# Patient Record
Sex: Female | Born: 1990
Health system: Southern US, Community
[De-identification: ages and names within clinical notes are randomized; demographics above are authoritative.]

## PROBLEM LIST (undated history)

## (undated) DIAGNOSIS — Z789 Other specified health status: Secondary | ICD-10-CM

## (undated) DIAGNOSIS — B009 Herpesviral infection, unspecified: Secondary | ICD-10-CM

## (undated) DIAGNOSIS — O139 Gestational [pregnancy-induced] hypertension without significant proteinuria, unspecified trimester: Secondary | ICD-10-CM

## (undated) HISTORY — DX: Other specified health status: Z78.9

## (undated) HISTORY — PX: NO PAST SURGERIES: SHX2092

---

## 2013-06-07 ENCOUNTER — Ambulatory Visit: Payer: Self-pay

## 2013-07-10 ENCOUNTER — Ambulatory Visit: Payer: Self-pay | Attending: Internal Medicine | Admitting: Internal Medicine

## 2013-07-10 ENCOUNTER — Encounter: Payer: Self-pay | Admitting: Internal Medicine

## 2013-07-10 ENCOUNTER — Encounter (INDEPENDENT_AMBULATORY_CARE_PROVIDER_SITE_OTHER): Payer: Self-pay

## 2013-07-10 VITALS — BP 135/80 | HR 88 | Temp 98.4°F | Resp 17 | Wt 171.8 lb

## 2013-07-10 DIAGNOSIS — Z23 Encounter for immunization: Secondary | ICD-10-CM

## 2013-07-10 DIAGNOSIS — R030 Elevated blood-pressure reading, without diagnosis of hypertension: Secondary | ICD-10-CM | POA: Insufficient documentation

## 2013-07-10 DIAGNOSIS — Z139 Encounter for screening, unspecified: Secondary | ICD-10-CM

## 2013-07-10 DIAGNOSIS — IMO0001 Reserved for inherently not codable concepts without codable children: Secondary | ICD-10-CM

## 2013-07-10 DIAGNOSIS — M791 Myalgia, unspecified site: Secondary | ICD-10-CM

## 2013-07-10 DIAGNOSIS — N644 Mastodynia: Secondary | ICD-10-CM

## 2013-07-10 LAB — CBC WITH DIFFERENTIAL/PLATELET
Basophils Absolute: 0 10*3/uL (ref 0.0–0.1)
Basophils Relative: 0 % (ref 0–1)
EOS ABS: 0 10*3/uL (ref 0.0–0.7)
EOS PCT: 1 % (ref 0–5)
HCT: 39.6 % (ref 36.0–46.0)
HEMOGLOBIN: 13.6 g/dL (ref 12.0–15.0)
LYMPHS PCT: 40 % (ref 12–46)
Lymphs Abs: 3.4 10*3/uL (ref 0.7–4.0)
MCH: 29.8 pg (ref 26.0–34.0)
MCHC: 34.3 g/dL (ref 30.0–36.0)
MCV: 86.7 fL (ref 78.0–100.0)
MONOS PCT: 6 % (ref 3–12)
Monocytes Absolute: 0.5 10*3/uL (ref 0.1–1.0)
Neutro Abs: 4.6 10*3/uL (ref 1.7–7.7)
Neutrophils Relative %: 53 % (ref 43–77)
PLATELETS: 243 10*3/uL (ref 150–400)
RBC: 4.57 MIL/uL (ref 3.87–5.11)
RDW: 13.2 % (ref 11.5–15.5)
WBC: 8.6 10*3/uL (ref 4.0–10.5)

## 2013-07-10 NOTE — Patient Instructions (Signed)
2 Gram Low Sodium Diet A 2 gram sodium diet restricts the amount of sodium in the diet to no more than 2 g or 2000 mg daily. Limiting the amount of sodium is often used to help lower blood pressure. It is important if you have heart, liver, or kidney problems. Many foods contain sodium for flavor and sometimes as a preservative. When the amount of sodium in a diet needs to be low, it is important to know what to look for when choosing foods and drinks. The following includes some information and guidelines to help make it easier for you to adapt to a low sodium diet. QUICK TIPS  Do not add salt to food.  Avoid convenience items and fast food.  Choose unsalted snack foods.  Buy lower sodium products, often labeled as "lower sodium" or "no salt added."  Check food labels to learn how much sodium is in 1 serving.  When eating at a restaurant, ask that your food be prepared with less salt or none, if possible. READING FOOD LABELS FOR SODIUM INFORMATION The nutrition facts label is a good place to find how much sodium is in foods. Look for products with no more than 500 to 600 mg of sodium per meal and no more than 150 mg per serving. Remember that 2 g = 2000 mg. The food label may also list foods as:  Sodium-free: Less than 5 mg in a serving.  Very low sodium: 35 mg or less in a serving.  Low-sodium: 140 mg or less in a serving.  Light in sodium: 50% less sodium in a serving. For example, if a food that usually has 300 mg of sodium is changed to become light in sodium, it will have 150 mg of sodium.  Reduced sodium: 25% less sodium in a serving. For example, if a food that usually has 400 mg of sodium is changed to reduced sodium, it will have 300 mg of sodium. CHOOSING FOODS Grains  Avoid: Salted crackers and snack items. Some cereals, including instant hot cereals. Bread stuffing and biscuit mixes. Seasoned rice or pasta mixes.  Choose: Unsalted snack items. Low-sodium cereals, oats,  puffed wheat and rice, shredded wheat. English muffins and bread. Pasta. Meats  Avoid: Salted, canned, smoked, spiced, pickled meats, including fish and poultry. Bacon, ham, sausage, cold cuts, hot dogs, anchovies.  Choose: Low-sodium canned tuna and salmon. Fresh or frozen meat, poultry, and fish. Dairy  Avoid: Processed cheese and spreads. Cottage cheese. Buttermilk and condensed milk. Regular cheese.  Choose: Milk. Low-sodium cottage cheese. Yogurt. Sour cream. Low-sodium cheese. Fruits and Vegetables  Avoid: Regular canned vegetables. Regular canned tomato sauce and paste. Frozen vegetables in sauces. Olives. Pickles. Relishes. Sauerkraut.  Choose: Low-sodium canned vegetables. Low-sodium tomato sauce and paste. Frozen or fresh vegetables. Fresh and frozen fruit. Condiments  Avoid: Canned and packaged gravies. Worcestershire sauce. Tartar sauce. Barbecue sauce. Soy sauce. Steak sauce. Ketchup. Onion, garlic, and table salt. Meat flavorings and tenderizers.  Choose: Fresh and dried herbs and spices. Low-sodium varieties of mustard and ketchup. Lemon juice. Tabasco sauce. Horseradish. SAMPLE 2 GRAM SODIUM MEAL PLAN Breakfast / Sodium (mg)  1 cup low-fat milk / 143 mg  2 slices whole-wheat toast / 270 mg  1 tbs heart-healthy margarine / 153 mg  1 hard-boiled egg / 139 mg  1 small orange / 0 mg Lunch / Sodium (mg)  1 cup raw carrots / 76 mg   cup hummus / 298 mg  1 cup low-fat milk /   143 mg   cup red grapes / 2 mg  1 whole-wheat pita bread / 356 mg Dinner / Sodium (mg)  1 cup whole-wheat pasta / 2 mg  1 cup low-sodium tomato sauce / 73 mg  3 oz lean ground beef / 57 mg  1 small side salad (1 cup raw spinach leaves,  cup cucumber,  cup yellow bell pepper) with 1 tsp olive oil and 1 tsp red wine vinegar / 25 mg Snack / Sodium (mg)  1 container low-fat vanilla yogurt / 107 mg  3 graham cracker squares / 127 mg Nutrient Analysis  Calories: 2033  Protein:  77 g  Carbohydrate: 282 g  Fat: 72 g  Sodium: 1971 mg Document Released: 06/01/2005 Document Revised: 08/24/2011 Document Reviewed: 09/02/2009 Exit[employer] Patient Information 2014 [employer].  

## 2013-07-10 NOTE — Progress Notes (Signed)
Patient here to establish care Has intermittent breast pain Leg pain to her upper thighs-usually when it is cold out

## 2013-07-10 NOTE — Progress Notes (Signed)
Patient Demographics  Rhonda Avila, is a 23 y.o. female  ZOX:[id number]  [id number]  DOB - [birth year]  CC:  Chief Complaint  Patient presents with  . Establish Care       HPI: Rhonda Avila is a 23 y.o. female here today to establish medical care. Patient reported to have breast pain especially around the menstrual cycle denies any discharge fever chills, she also reported to have feeling of some muscle pain in her thighs especially when it is cold season denies any pain currently. Patient has No headache, No chest pain, No abdominal pain - No Nausea, No new weakness tingling or numbness, No Cough - SOB.  No Known Allergies History reviewed. No pertinent past medical history. No current outpatient prescriptions on file prior to visit.   No current facility-administered medications on file prior to visit.   Family History  Problem Relation Age of Onset  . Hypertension Maternal Grandmother    History   Social History  . Marital Status: Single    Spouse Name: N/A    Number of Children: N/A  . Years of Education: N/A   Occupational History  . Not on file.   Social History Main Topics  . Smoking status: Never Smoker   . Smokeless tobacco: Not on file  . Alcohol Use: No  . Drug Use: Not on file  . Sexual Activity: Not on file   Other Topics Concern  . Not on file   Social History Narrative  . No narrative on file    Review of Systems: Constitutional: Negative for fever, chills, diaphoresis, activity change, appetite change and fatigue. HENT: Negative for ear pain, nosebleeds, congestion, facial swelling, rhinorrhea, neck pain, neck stiffness and ear discharge.  Eyes: Negative for pain, discharge, redness, itching and visual disturbance. Respiratory: Negative for cough, choking, chest tightness, shortness of breath, wheezing and stridor.  Cardiovascular: Negative for chest pain, palpitations and leg swelling. Gastrointestinal: Negative for abdominal  distention. Genitourinary: Negative for dysuria, urgency, frequency, hematuria, flank pain, decreased urine volume, difficulty urinating and dyspareunia.  Musculoskeletal: Negative for back pain, joint swelling, arthralgia and gait problem. Neurological: Negative for dizziness, tremors, seizures, syncope, facial asymmetry, speech difficulty, weakness, light-headedness, numbness and headaches.  Hematological: Negative for adenopathy. Does not bruise/bleed easily. Psychiatric/Behavioral: Negative for hallucinations, behavioral problems, confusion, dysphoric mood, decreased concentration and agitation.    Objective:   Filed Vitals:   07/10/13 1651  BP: 135/80  Pulse:   Temp:   Resp:     Physical Exam: Constitutional: Patient appears well-developed and well-nourished. No distress. HENT: Normocephalic, atraumatic, External right and left ear normal. Oropharynx is clear and moist.  Eyes: Conjunctivae and EOM are normal. PERRLA, no scleral icterus. Neck: Normal ROM. Neck supple. No JVD. No tracheal deviation. No thyromegaly. CVS: RRR, S1/S2 +, no murmurs, no gallops, no carotid bruit.  Pulmonary: Effort and breath sounds normal, no stridor, rhonchi, wheezes, rales.  Abdominal: Soft. BS +, no distension, tenderness, rebound or guarding.  Musculoskeletal: Normal range of motion. No edema and no tenderness.  Neuro: Alert. Normal reflexes, muscle tone coordination. No cranial nerve deficit. Skin: Skin is warm and dry. No rash noted. Not diaphoretic. No erythema. No pallor. Psychiatric: Normal mood and affect. Behavior, judgment, thought content normal.  No results found for this basename: WBC,  HGB,  HCT,  MCV,  PLT   No results found for this basename: CREATININE,  BUN,  NA,  K,  CL,  CO2    No results found for this  basename: HGBA1C   Lipid Panel  No results found for this basename: chol,  trig,  hdl,  cholhdl,  vldl,  ldlcalc       Assessment and plan:   1. Elevated BP Patient  has family history of hypertension I have advise for low salt diet.  2. Muscle pain We'll obtain baseline blood work.  3. Breast pain  - Ambulatory referral to Gynecology  4. Screening  - Ambulatory referral to Gynecology - CBC with Differential - COMPLETE METABOLIC PANEL WITH GFR - TSH - Lipid panel - Vit D  25 hydroxy (rtn osteoporosis monitoring)    6. Need for prophylactic vaccination and inoculation against influenza  Flu shot given today  Health Maintenance  -Pap Smear: referred to GYN  Return in about 6 weeks (around 08/21/2013).     Doris Cheadle, MD

## 2013-07-11 LAB — LIPID PANEL
Cholesterol: 174 mg/dL (ref 0–200)
HDL: 83 mg/dL (ref >39)
LDL CALC: 72 mg/dL (ref 0–99)
Total CHOL/HDL Ratio: 2.1 Ratio
Triglycerides: 95 mg/dL (ref <150)
VLDL: 19 mg/dL (ref 0–40)

## 2013-07-11 LAB — COMPLETE METABOLIC PANEL WITH GFR
ALT: 61 U/L — AB (ref 0–35)
AST: 25 U/L (ref 0–37)
Albumin: 4.2 g/dL (ref 3.5–5.2)
Alkaline Phosphatase: 75 U/L (ref 39–117)
BILIRUBIN TOTAL: 0.4 mg/dL (ref 0.3–1.2)
BUN: 9 mg/dL (ref 6–23)
CALCIUM: 9.7 mg/dL (ref 8.4–10.5)
CHLORIDE: 103 meq/L (ref 96–112)
CO2: 25 mEq/L (ref 19–32)
Creat: 0.79 mg/dL (ref 0.50–1.10)
GFR, Est African American: >89 mL/min
Glucose, Bld: 86 mg/dL (ref 70–99)
Potassium: 4 mEq/L (ref 3.5–5.3)
SODIUM: 138 meq/L (ref 135–145)
TOTAL PROTEIN: 7.5 g/dL (ref 6.0–8.3)

## 2013-07-11 LAB — VITAMIN D 25 HYDROXY (VIT D DEFICIENCY, FRACTURES): VIT D 25 HYDROXY: 16 ng/mL — AB (ref 30–89)

## 2013-07-11 LAB — TSH: TSH: 1.648 u[IU]/mL (ref 0.350–4.500)

## 2013-07-12 ENCOUNTER — Telehealth: Payer: Self-pay

## 2013-07-12 NOTE — Telephone Encounter (Signed)
Patient not available  Left message on machine to return our call 

## 2013-07-12 NOTE — Telephone Encounter (Signed)
Message copied by Lestine MountJUAREZ, Mateus Rewerts L on Wed Jul 12, 2013  2:19 PM ------      Message from: Doris CheadleADVANI, DEEPAK      Created: Tue Jul 11, 2013 11:00 AM       Blood work reviewed, noticed low vitamin D, call patient advise to start ergocalciferol 50,000 units once a week for the duration of  12 weeks.       ------

## 2013-07-13 ENCOUNTER — Telehealth: Payer: Self-pay | Admitting: Internal Medicine

## 2013-07-13 NOTE — Telephone Encounter (Signed)
Patient called in regards to missed call yesterday.Please call patient.

## 2013-07-13 NOTE — Telephone Encounter (Signed)
Pt returning call, please f/u  °

## 2013-07-17 MED ORDER — VITAMIN D (ERGOCALCIFEROL) 1.25 MG (50000 UNIT) PO CAPS: 50000.0000 [IU] | ORAL_CAPSULE | ORAL | Status: DC

## 2013-07-17 NOTE — Telephone Encounter (Signed)
Patient is aware of her lab results Prescription sent to [facility] in [place]

## 2013-08-21 ENCOUNTER — Encounter: Payer: Self-pay | Admitting: Obstetrics & Gynecology

## 2013-08-22 ENCOUNTER — Ambulatory Visit: Payer: Self-pay | Admitting: Internal Medicine

## 2016-07-23 ENCOUNTER — Ambulatory Visit: Payer: Self-pay | Attending: Family Medicine | Admitting: Family Medicine

## 2016-07-23 VITALS — BP 117/71 | HR 84 | Temp 98.1°F | Resp 18 | Ht 62.0 in | Wt 213.2 lb

## 2016-07-23 DIAGNOSIS — Z79899 Other long term (current) drug therapy: Secondary | ICD-10-CM | POA: Insufficient documentation

## 2016-07-23 DIAGNOSIS — Z23 Encounter for immunization: Secondary | ICD-10-CM | POA: Insufficient documentation

## 2016-07-23 DIAGNOSIS — B009 Herpesviral infection, unspecified: Secondary | ICD-10-CM | POA: Insufficient documentation

## 2016-07-23 DIAGNOSIS — R300 Dysuria: Secondary | ICD-10-CM | POA: Insufficient documentation

## 2016-07-23 DIAGNOSIS — R21 Rash and other nonspecific skin eruption: Secondary | ICD-10-CM | POA: Insufficient documentation

## 2016-07-23 DIAGNOSIS — N898 Other specified noninflammatory disorders of vagina: Secondary | ICD-10-CM | POA: Insufficient documentation

## 2016-07-23 DIAGNOSIS — R635 Abnormal weight gain: Secondary | ICD-10-CM

## 2016-07-23 DIAGNOSIS — R102 Pelvic and perineal pain: Secondary | ICD-10-CM | POA: Insufficient documentation

## 2016-07-23 DIAGNOSIS — Z113 Encounter for screening for infections with a predominantly sexual mode of transmission: Secondary | ICD-10-CM | POA: Insufficient documentation

## 2016-07-23 LAB — POCT URINALYSIS DIPSTICK
BILIRUBIN UA: NEGATIVE
GLUCOSE UA: NEGATIVE
Ketones, UA: NEGATIVE
LEUKOCYTES UA: NEGATIVE
NITRITE UA: NEGATIVE
Protein, UA: NEGATIVE
Spec Grav, UA: 1.015
UROBILINOGEN UA: 1
pH, UA: 7

## 2016-07-23 LAB — POCT URINE PREGNANCY: PREG TEST UR: NEGATIVE

## 2016-07-23 LAB — TSH: TSH: 0.92 mIU/L

## 2016-07-23 MED ORDER — METRONIDAZOLE 500 MG PO TABS: 500.0000 mg | ORAL_TABLET | Freq: Two times a day (BID) | ORAL | 0 refills | Status: DC

## 2016-07-23 MED ORDER — NYSTATIN 100000 UNIT/GM EX CREA: 1.0000 "application " | TOPICAL_CREAM | Freq: Two times a day (BID) | CUTANEOUS | 0 refills | Status: DC

## 2016-07-23 MED ORDER — FLUCONAZOLE 150 MG PO TABS: 150.0000 mg | ORAL_TABLET | Freq: Once | ORAL | 0 refills | Status: AC

## 2016-07-23 MED FILL — FLUCONAZOLE 150 MG TABLET: 150 | 1 days supply | Qty: 1 | Fill #0

## 2016-07-23 MED FILL — NYSTATIN 100,000 UNIT/GM CR: 100000 | 20 days supply | Qty: 30 | Fill #0

## 2016-07-23 MED FILL — ?METRONIDAZOLE 500 MG TABLE: 500 | 7 days supply | Qty: 14 | Fill #0

## 2016-07-23 NOTE — Progress Notes (Signed)
Subjective:  Patient ID: Rhonda Avila, female    DOB: [birth year]  Age: 26 y.o. MRN: [id number]  CC: Establish Care   HPI Rhonda Avila presents for complaints of aginal discharge for 3 weeks. Reports discharge is brown, and, and malodorous. Reports douching with no relief of symptoms. She reports pelvic pain with dysuria 4 days ago. She also reports vaginal rash 8 days ago. She reports one sexual partner within the last 3 months. Reports last time she was sexually active was 2 days ago and was unprotected. She would like screening for STDs and pregnancy. She also c/o history of weight gain within the last 3 years of almost 50 lbs.   Outpatient Medications Prior to Visit  Medication Sig Dispense Refill  . Vitamin D, Ergocalciferol, (DRISDOL) 50000 UNITS CAPS capsule Take 1 capsule (50,000 Units total) by mouth every 7 (seven) days. (Patient not taking: Reported on 07/23/2016) 12 capsule 0   No facility-administered medications prior to visit.     ROS Review of Systems  Respiratory: Negative.   Cardiovascular: Negative.   Gastrointestinal: Negative.   Genitourinary: Positive for dysuria, pelvic pain and vaginal discharge.    Objective:  BP 117/71 (BP Location: Left Arm, Patient Position: Sitting, Cuff Size: Normal)   Pulse 84   Temp 98.1 F (36.7 C) (Oral)   Resp 18   Ht 5\' 2"  (1.575 m)   Wt 213 lb 3.2 oz (96.7 kg)   SpO2 98%   BMI 38.99 kg/m   BP/Weight 07/23/2016 07/10/2013  Systolic BP 117 135  Diastolic BP 71 80  Wt. (Lbs) 213.2 171.8  BMI 38.99 -     Physical Exam  Cardiovascular: Normal rate, regular rhythm, normal heart sounds and intact distal pulses.   Pulmonary/Chest: Effort normal and breath sounds normal.  Abdominal: Soft. Bowel sounds are normal. There is tenderness (pelvic).  Genitourinary:  Genitourinary Comments: Urine cytology screen.  Nursing note and vitals reviewed.   Assessment & Plan:   Problem List Items Addressed This Visit    None      Visit Diagnoses    Vaginal discharge    -  Primary   Relevant Medications   metroNIDAZOLE (FLAGYL) 500 MG tablet   Other Relevant Orders   Urine cytology ancillary only (Completed)   POCT urine pregnancy (Completed)   Screening examination for STD (sexually transmitted disease)       Relevant Orders   Urine cytology ancillary only (Completed)   STD Panel (HBSAG,HIV,RPR) (Completed)   HSV(herpes simplex vrs) 1+2 ab-IgG (Completed)   Needs flu shot       Relevant Orders   Flu Vaccine QUAD 36+ mos PF IM (Fluarix & Fluzone Quad PF) (Completed)   Weight gain       Relevant Orders   POCT urine pregnancy (Completed)   TSH (Completed)   Dysuria       Relevant Orders   Urine cytology ancillary only (Completed)   Urinalysis Dipstick (Completed)   Skin rash       Relevant Medications   nystatin cream (MYCOSTATIN)      Meds ordered this encounter  Medications  . nystatin cream (MYCOSTATIN)    Sig: Apply 1 application topically 2 (two) times daily. Apply to affected areas.    Dispense:  30 g    Refill:  0    Order Specific Question:   Supervising Provider    Answer:   Quentin AngstJEGEDE, OLUGBEMIGA E [id number]]  . metroNIDAZOLE (FLAGYL) 500 MG tablet    Sig:  Take 1 tablet (500 mg total) by mouth 2 (two) times daily.    Dispense:  14 tablet    Refill:  0    Order Specific Question:   Supervising Provider    Answer:   Quentin Angst [id number]  . fluconazole (DIFLUCAN) 150 MG tablet    Sig: Take 1 tablet (150 mg total) by mouth once.    Dispense:  1 tablet    Refill:  0    Order Specific Question:   Supervising Provider    Answer:   Quentin Angst [[id number]]    Follow-up: Return in about 2 weeks (around 08/06/2016) for physical .   Lizbeth Bark FNP

## 2016-07-23 NOTE — Patient Instructions (Addendum)
Schedule an appointment for a physical in 2 weeks.  Vaginitis Vaginitis is an inflammation of the vagina. It can happen when the normal bacteria and yeast in the vagina grow too much. There are different types. Treatment will depend on the type you have. Follow these instructions at home:  Take all medicines as told by your doctor.  Keep your vagina area clean and dry. Avoid soap. Rinse the area with water.  Avoid washing and cleaning out the vagina (douching).  Do not use tampons or have sex (intercourse) until your treatment is done.  Wipe from front to back after going to the restroom.  Wear cotton underwear.  Avoid wearing underwear while you sleep until your vaginitis is gone.  Avoid tight pants. Avoid underwear or nylons without a cotton panel.  Take off wet clothing (such as a bathing suit) as soon as you can.  Use mild, unscented products. Avoid fabric softeners and scented:  Feminine sprays.  Laundry detergents.  Tampons.  Soaps or bubble baths.  Practice safe sex and use condoms. Get help right away if:  You have belly (abdominal) pain.  You have a fever or lasting symptoms for more than 2-3 days.  You have a fever and your symptoms suddenly get worse. This information is not intended to replace advice given to you by your health care provider. Make sure you discuss any questions you have with your health care provider. Document Released: 08/28/2008 Document Revised: 11/07/2015 Document Reviewed: 11/12/2011 Elsevier Interactive Patient Education  2017 [employer]   Sexually Transmitted Disease A sexually transmitted disease (STD) is a disease or infection often passed to another person during sex. However, STDs can be passed through nonsexual ways. An STD can be passed through:  Spit (saliva).  Semen.  Blood.  Mucus from the vagina.  Pee (urine). How can I lessen my chances of getting an STD?  Use:  Latex condoms.  Water-soluble lubricants  with condoms. Do not use petroleum jelly or oils.  Dental dams. These are small pieces of latex that are used as a barrier during oral sex.  Avoid having more than one sex partner.  Do not have sex with someone who has other sex partners.  Do not have sex with anyone you do not know or who is at high risk for an STD.  Avoid risky sex that can break your skin.  Do not have sex if you have open sores on your mouth or skin.  Avoid drinking too much alcohol or taking illegal drugs. Alcohol and drugs can affect your good judgment.  Avoid oral and anal sex acts.  Get shots (vaccines) for HPV and hepatitis.  If you are at risk of being infected with HIV, it is advised that you take a certain medicine daily to prevent HIV infection. This is called pre-exposure prophylaxis (PrEP). You may be at risk if:  You are a man who has sex with other men (MSM).  You are attracted to the opposite sex (heterosexual) and are having sex with more than one partner.  You take drugs with a needle.  You have sex with someone who has HIV.  Talk with your doctor about if you are at high risk of being infected with HIV. If you begin to take PrEP, get tested for HIV first. Get tested every 3 months for as long as you are taking PrEP.  Get tested for STDs every year if you are sexually active. If you are treated for an STD, get  tested again 3 months after you are treated. What should I do if I think I have an STD?  See your doctor.  Tell your sex partner(s) that you have an STD. They should be tested and treated.  Do not have sex until your doctor says it is okay. When should I get help? Get help right away if:  You have bad belly (abdominal) pain.  You are a man and have puffiness (swelling) or pain in your testicles.  You are a woman and have puffiness in your vagina. This information is not intended to replace advice given to you by your health care provider. Make sure you discuss any questions  you have with your health care provider. Document Released: 07/09/2004 Document Revised: 11/07/2015 Document Reviewed: 11/25/2012 Elsevier Interactive Patient Education  2017 [employer].

## 2016-07-23 NOTE — Progress Notes (Signed)
Patient is here for physical  Patient stated that she has a rash on her vaginal area for 2 weeks  Patient has eaten today  Patient is not taking any meds today

## 2016-07-24 LAB — URINE CYTOLOGY ANCILLARY ONLY
Chlamydia: NEGATIVE
Neisseria Gonorrhea: NEGATIVE
TRICH (WINDOWPATH): NEGATIVE

## 2016-07-24 LAB — STD PANEL
HIV 1&2 Ab, 4th Generation: NONREACTIVE
Hepatitis B Surface Ag: NEGATIVE

## 2016-07-24 LAB — HSV(HERPES SIMPLEX VRS) I + II AB-IGG
HSV 1 Glycoprotein G Ab, IgG: <0.9 {index}
HSV 2 Glycoprotein G Ab, IgG: 19.9 {index} — ABNORMAL HIGH

## 2016-07-27 ENCOUNTER — Telehealth: Payer: Self-pay | Admitting: Family Medicine

## 2016-07-27 LAB — URINE CYTOLOGY ANCILLARY ONLY
BACTERIAL VAGINITIS: NEGATIVE
Candida vaginitis: NEGATIVE

## 2016-07-27 NOTE — Telephone Encounter (Signed)
Patient called and informed of her labs results. Denies any concern to be addressed at this time.

## 2016-08-06 ENCOUNTER — Ambulatory Visit: Payer: Self-pay | Attending: Family Medicine | Admitting: Family Medicine

## 2016-08-06 VITALS — BP 110/66 | HR 80 | Temp 99.0°F | Resp 18 | Ht 62.0 in | Wt 212.2 lb

## 2016-08-06 DIAGNOSIS — Z01419 Encounter for gynecological examination (general) (routine) without abnormal findings: Secondary | ICD-10-CM

## 2016-08-06 DIAGNOSIS — R400 Somnolence: Secondary | ICD-10-CM | POA: Insufficient documentation

## 2016-08-06 DIAGNOSIS — Z113 Encounter for screening for infections with a predominantly sexual mode of transmission: Secondary | ICD-10-CM | POA: Insufficient documentation

## 2016-08-06 DIAGNOSIS — R0683 Snoring: Secondary | ICD-10-CM | POA: Insufficient documentation

## 2016-08-06 DIAGNOSIS — Z124 Encounter for screening for malignant neoplasm of cervix: Secondary | ICD-10-CM | POA: Insufficient documentation

## 2016-08-06 NOTE — Progress Notes (Signed)
Subjective:  Patient ID: Rhonda Avila, female    DOB: Jun 17, 1990  Age: 26 y.o. MRN: [id number]  CC: Establish Care   HPI Rhonda Avila presents for    Routine Pap exam: Denies any vaginal discharge, lesions, or dysuria. Requests STD testing with Pap. Declines STD blood testing. Reports great aunt of maternal grandmother has history breast cancer. Denies any  denting, dimpling, lumps, or nipple discharge of the breasts. Reports performing monthly SBE.  Snoring: Reports a 5-6 months history of snoring, daytime sleepiness, and throat sensation. Patient states "I feel like my tongue falling back into my throat". Denies any tongue swelling or difficulty breathing.      Outpatient Medications Prior to Visit  Medication Sig Dispense Refill  . metroNIDAZOLE (FLAGYL) 500 MG tablet Take 1 tablet (500 mg total) by mouth 2 (two) times daily. (Patient not taking: Reported on 08/06/2016) 14 tablet 0  . nystatin cream (MYCOSTATIN) Apply 1 application topically 2 (two) times daily. Apply to affected areas. (Patient not taking: Reported on 08/06/2016) 30 g 0  . Vitamin D, Ergocalciferol, (DRISDOL) 50000 UNITS CAPS capsule Take 1 capsule (50,000 Units total) by mouth every 7 (seven) days. (Patient not taking: Reported on 07/23/2016) 12 capsule 0   No facility-administered medications prior to visit.     ROS Review of Systems  HENT: Negative for sore throat and trouble swallowing.        Snoring  Respiratory: Negative.   Cardiovascular: Negative.   Gastrointestinal: Negative.   Genitourinary: Negative.     Objective:  BP 110/66 (BP Location: Left Arm, Patient Position: Sitting, Cuff Size: Normal)   Pulse 80   Temp 99 F (37.2 C) (Oral)   Resp 18   Ht 5\' 2"  (1.575 m)   Wt 212 lb 3.2 oz (96.3 kg)   SpO2 97%   BMI 38.81 kg/m   BP/Weight 08/06/2016 07/23/2016 07/10/2013  Systolic BP 110 117 135  Diastolic BP 66 71 80  Wt. (Lbs) 212.2 213.2 171.8  BMI 38.81 38.99 -     Physical Exam    HENT:  Right Ear: External ear normal.  Left Ear: External ear normal.  Nose: Nose normal.  Mouth/Throat: Oropharynx is clear and moist. No oropharyngeal exudate.  Eyes: Conjunctivae are normal. Pupils are equal, round, and reactive to light.  Neck: Normal range of motion. Neck supple. No tracheal deviation present. No thyromegaly present.  Cardiovascular: Normal rate, regular rhythm, normal heart sounds and intact distal pulses.   Pulmonary/Chest: Effort normal and breath sounds normal.  Abdominal: Soft. Bowel sounds are normal.  Genitourinary: Vagina normal.  Lymphadenopathy:    She has no cervical adenopathy.  Nursing note and vitals reviewed.   Assessment & Plan:   Problem List Items Addressed This Visit    None    Visit Diagnoses    Encounter for routine gynecological examination with Papanicolaou smear of cervix    -  Primary   Relevant Orders   Cytology - PAP [place] (Completed)   Daytime sleepiness       Relevant Orders   Ambulatory referral to Neurology   Screening for STDs (sexually transmitted diseases)       Relevant Orders   Cytology - PAP [place] (Completed)   Snoring       Relevant Orders   Ambulatory referral to Neurology        Follow-up: Return in about 3 years (around 08/07/2019) for Routine pap exam.   Lizbeth BarkMandesia R Shylah Dossantos FNP

## 2016-08-06 NOTE — Patient Instructions (Addendum)
Pap Test Why am I having this test? A pap test is sometimes called a pap smear. It is a screening test that is used to check for signs of cancer of the vagina, cervix, and uterus. The test can also identify the presence of infection or precancerous changes. Your health care provider will likely recommend you have this test done on a regular basis. This test may be done:  Every 3 years, starting at age 26.  Every 5 years, in combination with testing for the presence of human papillomavirus (HPV).  More or less often depending on other medical conditions. What kind of sample is taken? Using a small cotton swab, plastic spatula, or brush, your health care provider will collect a sample of cells from the surface of your cervix. Your cervix is the opening to your uterus, also called a womb. Secretions from the cervix and vagina may also be collected. How do I prepare for this test?  Be aware of where you are in your menstrual cycle. You may be asked to reschedule the test if you are menstruating on the day of the test.  You may need to reschedule if you have a known vaginal infection on the day of the test.  You may be asked to avoid douching or taking a bath the day before or the day of the test.  Some medicines can cause abnormal test results, such as digitalis and tetracycline. Talk with your health care provider before your test if you take one of these medicines. What do the results mean? Abnormal test results may indicate a number of health conditions. These may include:  Cancer. Although pap test results cannot be used to diagnose cancer of the cervix, vagina, or uterus, they may suggest the possibility of cancer. Further tests would be required to determine if cancer is present.  Sexually transmitted disease.  Fungal infection.  Parasite infection.  Herpes infection.  A condition causing or contributing to infertility. It is your responsibility to obtain your test results. Ask  the lab or department performing the test when and how you will get your results. Contact your health care provider to discuss any questions you have about your results. Talk with your health care provider to discuss your results, treatment options, and if necessary, the need for more tests. Talk with your health care provider if you have any questions about your results. This information is not intended to replace advice given to you by your health care provider. Make sure you discuss any questions you have with your health care provider. Document Released: 08/22/2002 Document Revised: 02/05/2016 Document Reviewed: 10/23/2013 Elsevier Interactive Patient Education  2017 Elsevier Inc.  Breast Self-Awareness Introduction Breast self-awareness means:  Knowing how your breasts look.  Knowing how your breasts feel.  Checking your breasts every month for changes.  Telling your doctor if you notice a change in your breasts. Breast self-awareness allows you to notice a breast problem early while it is still small. How to do a breast self-exam One way to learn what is normal for your breasts and to check for changes is to do a breast self-exam. To do a breast self-exam: Look for Changes  1. Take off all the clothes above your waist. 2. Stand in front of a mirror in a room with good lighting. 3. Put your hands on your hips. 4. Push your hands down. 5. Look at your breasts and nipples in the mirror to see if one breast or nipple looks different than  the other. Check to see if:  The shape of one breast is different.  The size of one breast is different.  There are wrinkles, dips, and bumps in one breast and not the other. 6. Look at each breast for changes in your skin, such as:  Redness.  Scaly areas. 7. Look for changes in your nipples, such as:  Liquid around the nipples.  Bleeding.  Dimpling.  Redness.  A change in where the nipples are. Feel for Changes 1. Lie on your back  on the floor. 2. Feel each breast. To do this, follow these steps:  Pick a breast to feel.  Put the arm closest to that breast above your head.  Use your other arm to feel the nipple area of your breast. Feel the area with the pads of your three middle fingers by making small circles with your fingers. For the first circle, press lightly. For the second circle, press harder. For the third circle, press even harder.  Keep making circles with your fingers at the light, harder, and even harder pressures as you move down your breast. Stop when you feel your ribs.  Move your fingers a little toward the center of your body.  Start making circles with your fingers again, this time going up until you reach your collarbone.  Keep making up and down circles until you reach your armpit. Remember to keep using the three pressures.  Feel the other breast in the same way. 3. Sit or stand in the shower or tub. 4. With soapy water on your skin, feel each breast the same way you did in step 2, when you were lying on the floor. Write Down What You Find  After doing the self-exam, write down:  What is normal for each breast.  Any changes you find in each breast.  When you last had your period. How often should I check my breasts? Check your breasts every month. If you are breastfeeding, the best time to check them is after you feed your baby or after you use a breast pump. If you get periods, the best time to check your breasts is 5-7 days after your period is over. When should I see my doctor? See your doctor if you notice:  A change in shape or size of your breasts or nipples.  A change in the skin of your breast or nipples, such as red or scaly skin.  Unusual fluid coming from your nipples.  A lump or thick area that was not there before.  Pain in your breasts.  Anything that concerns you. This information is not intended to replace advice given to you by your health care provider. Make  sure you discuss any questions you have with your health care provider. Document Released: 11/18/2007 Document Revised: 11/07/2015 Document Reviewed: 04/21/2015  2017 Elsevier

## 2016-08-06 NOTE — Progress Notes (Signed)
Patient is here for PAP  Patient denies pain today

## 2016-08-07 LAB — CYTOLOGY - PAP
DIAGNOSIS: NEGATIVE
HPV (WINDOPATH): NOT DETECTED

## 2016-08-07 LAB — CERVICOVAGINAL ANCILLARY ONLY
Bacterial vaginitis: NEGATIVE
CHLAMYDIA, DNA PROBE: NEGATIVE
Candida vaginitis: POSITIVE — AB
Neisseria Gonorrhea: NEGATIVE
TRICH (WINDOWPATH): NEGATIVE

## 2016-08-11 LAB — CERVICOVAGINAL ANCILLARY ONLY: Herpes: NEGATIVE

## 2016-08-12 ENCOUNTER — Telehealth: Payer: Self-pay

## 2016-08-12 ENCOUNTER — Other Ambulatory Visit: Payer: Self-pay | Admitting: Family Medicine

## 2016-08-12 DIAGNOSIS — B3731 Acute candidiasis of vulva and vagina: Secondary | ICD-10-CM

## 2016-08-12 DIAGNOSIS — B373 Candidiasis of vulva and vagina: Secondary | ICD-10-CM

## 2016-08-12 MED ORDER — FLUCONAZOLE 150 MG PO TABS: 150.0000 mg | ORAL_TABLET | Freq: Once | ORAL | 0 refills | Status: AC

## 2016-08-12 NOTE — Telephone Encounter (Signed)
CMA call to inform patient about PAP results  Patient Verify DOB  Patient was aware and understood

## 2016-08-12 NOTE — Telephone Encounter (Signed)
-----   Message from Lizbeth BarkMandesia R Hairston, [place] sent at 08/12/2016  1:29 PM EST ----- -Pap smear is normal. No negative for any lesions or malignancy. -Herpes, Gonorrhea, Chlamydia, BV, and Trichomonas were all negative. -Yeast was positive. You have been prescribed Diflucan to treat.   -.

## 2016-10-19 ENCOUNTER — Ambulatory Visit: Payer: Self-pay | Attending: Family Medicine | Admitting: Family Medicine

## 2016-10-19 VITALS — BP 117/74 | HR 72 | Temp 98.5°F | Resp 18 | Ht 62.0 in | Wt 209.6 lb

## 2016-10-19 DIAGNOSIS — R52 Pain, unspecified: Secondary | ICD-10-CM

## 2016-10-19 DIAGNOSIS — Z803 Family history of malignant neoplasm of breast: Secondary | ICD-10-CM | POA: Insufficient documentation

## 2016-10-19 DIAGNOSIS — N644 Mastodynia: Secondary | ICD-10-CM | POA: Insufficient documentation

## 2016-10-19 DIAGNOSIS — N6452 Nipple discharge: Secondary | ICD-10-CM | POA: Insufficient documentation

## 2016-10-19 DIAGNOSIS — Z79899 Other long term (current) drug therapy: Secondary | ICD-10-CM | POA: Insufficient documentation

## 2016-10-19 DIAGNOSIS — Z87898 Personal history of other specified conditions: Secondary | ICD-10-CM

## 2016-10-19 LAB — POCT URINE PREGNANCY: Preg Test, Ur: NEGATIVE

## 2016-10-19 MED ORDER — IBUPROFEN 600 MG PO TABS: 600.0000 mg | ORAL_TABLET | Freq: Four times a day (QID) | ORAL | 0 refills | Status: DC | PRN

## 2016-10-19 MED FILL — IBUPROFEN 600 MG TABLET: 600 | 7 days supply | Qty: 30 | Fill #0

## 2016-10-19 NOTE — Progress Notes (Signed)
Patient is here for Breast pain   Patient stated that both of her breast but mostly her left one hurts  Patient stated that only when press both breast some discharge comes out   Patient is not taking any medication at all  Patient had some bread & a cup of coffee this morning

## 2016-10-19 NOTE — Progress Notes (Signed)
Subjective:  Patient ID: Rhonda Avila, female    DOB: 09/07/1990  Age: 26 y.o. MRN: [id number]  CC: Establish Care   HPI Rhonda Avila presents for  Breast pain: 10 day history of bilateral breast pain, left greater than right. Reports family history of breast cancer, her maternal great aunt. Denies any lump, denting, or dimpling of the breast. She does report firm, hard, breast with breast tenderness. Inability to wear bra due to tenderness. She reports nipple discharge only if nipple is pressed. Denies breastfeeding or injury. History of hormonal contraceptives , Nuva Ring which she reports starting 3 months ago after several years of using depo-vera for birth control.     Outpatient Medications Prior to Visit  Medication Sig Dispense Refill  . metroNIDAZOLE (FLAGYL) 500 MG tablet Take 1 tablet (500 mg total) by mouth 2 (two) times daily. (Patient not taking: Reported on 08/06/2016) 14 tablet 0  . nystatin cream (MYCOSTATIN) Apply 1 application topically 2 (two) times daily. Apply to affected areas. (Patient not taking: Reported on 08/06/2016) 30 g 0  . Vitamin D, Ergocalciferol, (DRISDOL) 50000 UNITS CAPS capsule Take 1 capsule (50,000 Units total) by mouth every 7 (seven) days. (Patient not taking: Reported on 07/23/2016) 12 capsule 0   No facility-administered medications prior to visit.     ROS Review of Systems  Constitutional: Negative.   Respiratory: Negative.   Cardiovascular: Negative.   Gastrointestinal: Negative.   Skin:       History of breast tenderness and nipple discharge    Objective:  BP 117/74 (BP Location: Left Arm, Patient Position: Sitting, Cuff Size: Normal)   Pulse 72   Temp 98.5 F (36.9 C) (Oral)   Resp 18   Ht 5\' 2"  (1.575 m)   Wt 209 lb 9.6 oz (95.1 kg)   SpO2 97%   BMI 38.34 kg/m   BP/Weight 10/19/2016 08/06/2016 07/23/2016  Systolic BP 117 110 117  Diastolic BP 74 66 71  Wt. (Lbs) 209.6 212.2 213.2  BMI 38.34 38.81 38.99    Physical Exam    Constitutional: She appears well-developed and well-nourished.  Cardiovascular: Normal rate, regular rhythm, normal heart sounds and intact distal pulses.   Pulmonary/Chest: Effort normal and breath sounds normal. Right breast exhibits tenderness. Right breast exhibits no inverted nipple, no mass, no nipple discharge and no skin change. Left breast exhibits tenderness. Left breast exhibits no inverted nipple, no mass, no nipple discharge and no skin change. Breasts are symmetrical.  Abdominal: Soft. Bowel sounds are normal.  Skin: Skin is warm and dry.  Nursing note and vitals reviewed.  Assessment & Plan:   Problem List Items Addressed This Visit    None    Visit Diagnoses    Acute breast pain    -  Primary   Relevant Medications   ibuprofen (ADVIL,MOTRIN) 600 MG tablet   Other Relevant Orders   US BREAST COMPLETE UNI LEFT INC AXILLA   US BREAST COMPLETE UNI RIGHT INC AXILLA   POCT urine pregnancy (Completed)   History of nipple discharge       Relevant Orders   US BREAST COMPLETE UNI LEFT INC AXILLA   US BREAST COMPLETE UNI RIGHT INC AXILLA   POCT urine pregnancy (Completed)   Basic metabolic panel (Completed)   Prolactin (Completed)   TSH (Completed)   Family history of breast cancer       Relevant Orders   US BREAST COMPLETE UNI LEFT INC AXILLA   US BREAST COMPLETE UNI  RIGHT INC AXILLA      Meds ordered this encounter  Medications  . DISCONTD: ibuprofen (ADVIL,MOTRIN) 600 MG tablet    Sig: Take 1 tablet (600 mg total) by mouth every 6 (six) hours as needed for moderate pain.    Dispense:  30 tablet    Refill:  0    Order Specific Question:   Supervising Provider    Answer:   Quentin Angst [id number]  . ibuprofen (ADVIL,MOTRIN) 600 MG tablet    Sig: Take 1 tablet (600 mg total) by mouth every 6 (six) hours as needed for moderate pain.    Dispense:  30 tablet    Refill:  0    Follow-up: Return if symptoms worsen or fail to improve.   Lizbeth Bark  FNP

## 2016-10-19 NOTE — Patient Instructions (Addendum)
Breast Tenderness Breast tenderness is a common problem for women of all ages. Breast tenderness may cause mild discomfort to severe pain. The pain usually comes and goes in association with your menstrual cycle, but it can be constant. Breast tenderness has many possible causes, including hormone changes and some medicines. Your health care provider may order tests, such as a mammogram or an ultrasound, to check for any unusual findings. Having breast tenderness usually does not mean that you have breast cancer. Follow these instructions at home: Sometimes, reassurance that you do not have breast cancer is all that is needed. In general, follow these home care instructions: Managing pain and discomfort  If directed, apply ice to the area:  Put ice in a plastic bag.  Place a towel between your skin and the bag.  Leave the ice on for 20 minutes, 2-3 times a day.  Make sure you are wearing a supportive bra, especially during exercise. You may also want to wear a supportive bra while sleeping if your breasts are very tender. Medicines  Take over-the-counter and prescription medicines only as told by your health care provider. If the cause of your pain is infection, you may be prescribed an antibiotic medicine.  If you were prescribed an antibiotic, take it as told by your health care provider. Do not stop taking the antibiotic even if you start to feel better. General instructions  Your health care provider may recommend that you reduce the amount of fat in your diet. You can do this by:  Limiting fried foods.  Cooking foods using methods, such as baking, boiling, grilling, and broiling.  Decrease the amount of caffeine in your diet. You can do this by drinking more water and choosing caffeine-free options.  Keep a log of the days and times when your breasts are most tender.  Ask your health care provider how to do breast exams at home. This will help you notice if you have an unusual  growth or lump. Contact a health care provider if:  Any part of your breast is hard, red, and hot to the touch. This may be a sign of infection.  You are not breastfeeding and you have fluid, especially blood or pus, coming out of your nipples.  You have a fever.  You have a new or painful lump in your breast that remains after your menstrual period ends.  Your pain does not improve or it gets worse.  Your pain is interfering with your daily activities. This information is not intended to replace advice given to you by your health care provider. Make sure you discuss any questions you have with your health care provider. Document Released: 05/14/2008 Document Revised: 02/28/2016 Document Reviewed: 02/28/2016 Elsevier Interactive Patient Education  2017 Elsevier Inc.  

## 2016-10-20 LAB — BASIC METABOLIC PANEL
BUN/Creatinine Ratio: 14 (ref 9–23)
BUN: 9 mg/dL (ref 6–20)
CALCIUM: 9.2 mg/dL (ref 8.7–10.2)
CHLORIDE: 102 mmol/L (ref 96–106)
CO2: 20 mmol/L (ref 18–29)
CREATININE: 0.64 mg/dL (ref 0.57–1.00)
GFR calc Af Amer: 143 mL/min/{1.73_m2} (ref >59)
GFR calc non Af Amer: 124 mL/min/{1.73_m2} (ref >59)
Glucose: 69 mg/dL (ref 65–99)
Potassium: 4.2 mmol/L (ref 3.5–5.2)
Sodium: 140 mmol/L (ref 134–144)

## 2016-10-20 LAB — PROLACTIN: PROLACTIN: 10.4 ng/mL (ref 4.8–23.3)

## 2016-10-20 LAB — TSH: TSH: 0.973 u[IU]/mL (ref 0.450–4.500)

## 2016-10-21 ENCOUNTER — Telehealth: Payer: Self-pay

## 2016-10-21 NOTE — Telephone Encounter (Signed)
CMA call regarding lab results   Patient did not answer but left a detailed message  & if have any questions just to call back   

## 2016-10-21 NOTE — Telephone Encounter (Signed)
-----   Message from Lizbeth BarkMandesia R Hairston, [place] sent at 10/21/2016  1:49 PM EDT ----- Labs that evaluated your blood cells, fluid and electrolyte balance are normal. No signs of anemia, infection, or inflammation present. Thyroid function normal. When elevated it can indicate problems with the thyroid. Prolactin level is normal. When elevated it can indicate problems with the thyroid or pituitary gland. Which can cause symptoms of nipple discharge.  Follow up with imaging studies.

## 2017-06-22 ENCOUNTER — Ambulatory Visit: Payer: Self-pay | Attending: Family Medicine | Admitting: Family Medicine

## 2017-06-22 VITALS — BP 112/74 | HR 81 | Temp 98.3°F | Resp 18 | Ht 62.4 in | Wt 222.0 lb

## 2017-06-22 DIAGNOSIS — B009 Herpesviral infection, unspecified: Secondary | ICD-10-CM | POA: Insufficient documentation

## 2017-06-22 DIAGNOSIS — A6004 Herpesviral vulvovaginitis: Secondary | ICD-10-CM

## 2017-06-22 DIAGNOSIS — N912 Amenorrhea, unspecified: Secondary | ICD-10-CM | POA: Insufficient documentation

## 2017-06-22 DIAGNOSIS — Z113 Encounter for screening for infections with a predominantly sexual mode of transmission: Secondary | ICD-10-CM

## 2017-06-22 DIAGNOSIS — R5383 Other fatigue: Secondary | ICD-10-CM

## 2017-06-22 DIAGNOSIS — Z8619 Personal history of other infectious and parasitic diseases: Secondary | ICD-10-CM | POA: Insufficient documentation

## 2017-06-22 DIAGNOSIS — Z79899 Other long term (current) drug therapy: Secondary | ICD-10-CM | POA: Insufficient documentation

## 2017-06-22 DIAGNOSIS — N926 Irregular menstruation, unspecified: Secondary | ICD-10-CM

## 2017-06-22 DIAGNOSIS — Z789 Other specified health status: Secondary | ICD-10-CM

## 2017-06-22 DIAGNOSIS — N898 Other specified noninflammatory disorders of vagina: Secondary | ICD-10-CM

## 2017-06-22 LAB — POCT URINE PREGNANCY: PREG TEST UR: NEGATIVE

## 2017-06-22 MED ORDER — ACYCLOVIR 400 MG PO TABS: 400.0000 mg | ORAL_TABLET | Freq: Three times a day (TID) | ORAL | 5 refills | Status: DC

## 2017-06-22 NOTE — Progress Notes (Signed)
   Subjective:  Patient ID: Rhonda Avila, female    DOB: 08/07/1990  Age: 27 y.o. MRN: [id number]  CC: Vaginal Discharge and Gynecologic Exam   HPI Rhonda Avila presents for complaints of vaginal discharge. Onset 1 week ago. Discharge is described as brown. Associated symptoms include pruritis and burning. She is currently sexually active. She reports LPM was August. She d/[place] Nuva Ring in August[place] She also c/o fatigue, Onset 2 months ago. She denies any recent stressors, hematuria,or  bloody stools.  Outpatient Medications Prior to Visit  Medication Sig Dispense Refill  . ibuprofen (ADVIL,MOTRIN) 600 MG tablet Take 1 tablet (600 mg total) by mouth every 6 (six) hours as needed for moderate pain. 30 tablet 0  . metroNIDAZOLE (FLAGYL) 500 MG tablet Take 1 tablet (500 mg total) by mouth 2 (two) times daily. (Patient not taking: Reported on 08/06/2016) 14 tablet 0  . nystatin cream (MYCOSTATIN) Apply 1 application topically 2 (two) times daily. Apply to affected areas. (Patient not taking: Reported on 08/06/2016) 30 g 0  . Vitamin D, Ergocalciferol, (DRISDOL) 50000 UNITS CAPS capsule Take 1 capsule (50,000 Units total) by mouth every 7 (seven) days. (Patient not taking: Reported on 07/23/2016) 12 capsule 0   No facility-administered medications prior to visit.     Review of Systems  Constitutional: Negative.   Respiratory: Negative.   Cardiovascular: Negative.   Genitourinary:       Vaginal discharge  Skin: Positive for itching.  Psychiatric/Behavioral: Negative.     Objective:  BP 112/74 (BP Location: Left Arm, Patient Position: Sitting, Cuff Size: Large)   Pulse 81   Temp 98.3 F (36.8 C)   Resp 18   Ht 5' 2.4" (1.585 m)   Wt 222 lb (100.7 kg)   SpO2 99%   BMI 40.08 kg/m   BP/Weight 06/22/2017 10/19/2016 08/06/2016  Systolic BP 112 117 110  Diastolic BP 74 74 66  Wt. (Lbs) 222 209.6 212.2  BMI 40.08 38.34 38.81   Physical Exam  Nursing note  and vitals reviewed. Cardiovascular: Normal rate, regular rhythm, normal heart sounds and intact distal pulses.  Genitourinary: There is lesion (excoriation and shallow ulcerations to bilateral groin and labia) on the right labia. There is lesion (excoriation  and shallow ulcerations to bilateral groin and labia) on the left labia. Cervix exhibits discharge (brown).     Assessment & Plan:   1. Vaginal discharge  - Cervicovaginal ancillary only  2. 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 Take for 5 days.  Dispense: 15 tablet; Refill: 5  3. Fatigue, unspecified type   - CBC; Future - TSH; Future  4. Screening for STDs (sexually transmitted diseases)  - Cervicovaginal ancillary only - HEP, RPR, HIV Panel - HSV(herpes simplex vrs) 1+2 ab-IgG  5. Attempting to conceive   - POCT urine pregnancy  6. Missed menses   - POCT urine pregnancy    Follow-up: Return if symptoms worsen or fail to improve.   Lizbeth BarkMandesia R Hairston FNP

## 2017-06-22 NOTE — Progress Notes (Signed)
C/C vaginal discharge, coffee color  Itching and burning x 1 week  Sexually active  No pain today

## 2017-06-22 NOTE — Patient Instructions (Addendum)

## 2017-06-23 ENCOUNTER — Ambulatory Visit: Payer: Self-pay | Attending: Family Medicine

## 2017-06-23 DIAGNOSIS — R5383 Other fatigue: Secondary | ICD-10-CM | POA: Insufficient documentation

## 2017-06-23 MED FILL — ?ACYCLOVIR 400 TAB: 400 | 5 days supply | Qty: 15 | Fill #0

## 2017-06-23 NOTE — Progress Notes (Signed)
Patient here for lab visit only 

## 2017-06-24 LAB — CBC
HEMOGLOBIN: 13.2 g/dL (ref 11.1–15.9)
Hematocrit: 39.7 % (ref 34.0–46.6)
MCH: 30.1 pg (ref 26.6–33.0)
MCHC: 33.2 g/dL (ref 31.5–35.7)
MCV: 91 fL (ref 79–97)
Platelets: 252 10*3/uL (ref 150–379)
RBC: 4.38 x10E6/uL (ref 3.77–5.28)
RDW: 13.6 % (ref 12.3–15.4)
WBC: 8.1 10*3/uL (ref 3.4–10.8)

## 2017-06-24 LAB — CERVICOVAGINAL ANCILLARY ONLY
BACTERIAL VAGINITIS: NEGATIVE
CANDIDA VAGINITIS: POSITIVE — AB
Chlamydia: NEGATIVE
NEISSERIA GONORRHEA: NEGATIVE
TRICH (WINDOWPATH): NEGATIVE

## 2017-06-24 LAB — TSH: TSH: 0.956 u[IU]/mL (ref 0.450–4.500)

## 2017-06-28 ENCOUNTER — Ambulatory Visit: Payer: Self-pay | Attending: Family Medicine | Admitting: Family Medicine

## 2017-06-28 ENCOUNTER — Encounter: Payer: Self-pay | Admitting: Family Medicine

## 2017-06-28 VITALS — BP 121/79 | HR 81 | Temp 98.5°F | Resp 18 | Ht 62.0 in | Wt 226.0 lb

## 2017-06-28 DIAGNOSIS — B3731 Acute candidiasis of vulva and vagina: Secondary | ICD-10-CM

## 2017-06-28 DIAGNOSIS — B373 Candidiasis of vulva and vagina: Secondary | ICD-10-CM | POA: Insufficient documentation

## 2017-06-28 DIAGNOSIS — Z79899 Other long term (current) drug therapy: Secondary | ICD-10-CM | POA: Insufficient documentation

## 2017-06-28 DIAGNOSIS — A6004 Herpesviral vulvovaginitis: Secondary | ICD-10-CM

## 2017-06-28 DIAGNOSIS — N926 Irregular menstruation, unspecified: Secondary | ICD-10-CM | POA: Insufficient documentation

## 2017-06-28 DIAGNOSIS — N979 Female infertility, unspecified: Secondary | ICD-10-CM

## 2017-06-28 DIAGNOSIS — B009 Herpesviral infection, unspecified: Secondary | ICD-10-CM | POA: Insufficient documentation

## 2017-06-28 DIAGNOSIS — Z789 Other specified health status: Secondary | ICD-10-CM

## 2017-06-28 MED ORDER — MICONAZOLE NITRATE 2 % VA CREA: TOPICAL_CREAM | VAGINAL | 0 refills | Status: DC

## 2017-06-28 MED ORDER — FLUCONAZOLE 150 MG PO TABS: 150.0000 mg | ORAL_TABLET | Freq: Once | ORAL | 0 refills | Status: AC

## 2017-06-28 MED ORDER — VALACYCLOVIR HCL 1 G PO TABS: 1000.0000 mg | ORAL_TABLET | Freq: Every day | ORAL | 11 refills | Status: AC

## 2017-06-28 MED FILL — ?VALACYCLOVIR 1 GRAM TAB: 1000 MG | 5 days supply | Qty: 5 | Fill #0

## 2017-06-28 MED FILL — FLUCONAZOLE 150 MG TABLET: 150 | 1 days supply | Qty: 1 | Fill #0

## 2017-06-28 NOTE — Progress Notes (Signed)
Patient was made aware of these results in office as well as pregnancy being negative.

## 2017-06-28 NOTE — Progress Notes (Signed)
Subjective:  Patient ID: Rhonda Avila, female    DOB: 01-17-1991  Age: 27 y.o. MRN: [id number]  CC: Follow-up   HPI Rhonda Avila presents for complaints of vaginal irritation. Associated symptoms include pruritis and burning. History of herpes simplex 2 infection. She reports symptoms have gradually worsened. She is currently sexually active. She reports LPM was August 2018. She d/c Nuva Ring in Started in January 2018- August 2018. Prior to this she was on Depo-Provera injections for almost 3 years and reports being 17 months without a period prior to starting [employer]. She reports attempting to conceive.    Outpatient Medications Prior to Visit  Medication Sig Dispense Refill  . ibuprofen (ADVIL,MOTRIN) 600 MG tablet Take 1 tablet (600 mg total) by mouth every 6 (six) hours as needed for moderate pain. 30 tablet 0  . acyclovir (ZOVIRAX) 400 MG tablet Take 1 tablet (400 mg total) by mouth 3 (three) times daily. Take for 5 days. 15 tablet 5  . metroNIDAZOLE (FLAGYL) 500 MG tablet Take 1 tablet (500 mg total) by mouth 2 (two) times daily. (Patient not taking: Reported on 08/06/2016) 14 tablet 0  . nystatin cream (MYCOSTATIN) Apply 1 application topically 2 (two) times daily. Apply to affected areas. (Patient not taking: Reported on 08/06/2016) 30 g 0  . Vitamin D, Ergocalciferol, (DRISDOL) 50000 UNITS CAPS capsule Take 1 capsule (50,000 Units total) by mouth every 7 (seven) days. (Patient not taking: Reported on 07/23/2016) 12 capsule 0   No facility-administered medications prior to visit.     Review of Systems  Constitutional: Negative.   Respiratory: Negative.   Cardiovascular: Negative.   Genitourinary:       Vaginal irritation  Skin: Positive for itching.  Psychiatric/Behavioral: Negative.     Objective:  BP 121/79 (BP Location: Left Arm, Patient Position: Sitting, Cuff Size: Large)   Pulse 81   Temp 98.5 F (36.9 C) (Oral)   Resp 18   Ht 5\' 2"  (1.575 m)   Wt 226 lb  (102.5 kg)   SpO2 99%   BMI 41.34 kg/m   BP/Weight 06/28/2017 06/22/2017 10/19/2016  Systolic BP 121 112 117  Diastolic BP 79 74 74  Wt. (Lbs) 226 222 209.6  BMI 41.34 40.08 38.34   Physical Exam  Nursing note and vitals reviewed. Constitutional: She appears well-developed and well-nourished.  Cardiovascular: Normal rate, regular rhythm, normal heart sounds and intact distal pulses.  Respiratory: Effort normal and breath sounds normal.  GI: Soft. Bowel sounds are normal. There is no tenderness.  Genitourinary: There is lesion (excoriation to bilateral groin and labia; with maceration) on the right labia. There is lesion (excoriation to bilateral groin and labia; with maceration) on the left labia. Cervix exhibits discharge (brown). Vaginal discharge (white discharge present) found.     Assessment & Plan:   1. Candida vaginitis Urine cytology + for yeast.  - fluconazole (DIFLUCAN) 150 MG tablet; Take 1 tablet (150 mg total) by mouth once for 1 dose.  Dispense: 1 tablet; Refill: 0 - miconazole (MONISTAT 7 SIMPLY CURE) 2 % vaginal cream; Apply small amount topically to affected areas.  Dispense: 45 g; Refill: 0  2. 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 For outbreaks.  Dispense: 5 tablet; Refill: 11  3. 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 Attempting to conceive  - Ambulatory referral to  Gynecology  5. Irregular menstrual cycle Unable to obtain urine sample.  - Ambulatory referral to Gynecology - POCT urine pregnancy    Follow-up: Return if symptoms worsen or fail to improve.   Lizbeth Bark FNP

## 2017-06-28 NOTE — Patient Instructions (Signed)

## 2017-07-01 ENCOUNTER — Telehealth: Payer: Self-pay | Admitting: *Deleted

## 2017-07-01 NOTE — Telephone Encounter (Signed)
-----   Message from Lizbeth BarkMandesia R Hairston, FNP sent at 06/29/2017  1:35 PM EST ----- Thyroid levels normal. Labs normal no anemia.

## 2017-07-01 NOTE — Telephone Encounter (Signed)
Patient verified DOB Patient is aware of thyroid being normal and labs showing no anemia. No further questions.

## 2017-07-02 MED FILL — ?VALACYCLOVIR HCL 1 GRAM TA: 1 | 5 days supply | Qty: 5 | Fill #1

## 2017-07-05 ENCOUNTER — Ambulatory Visit: Payer: Self-pay | Attending: Family Medicine

## 2017-12-06 ENCOUNTER — Encounter: Payer: Self-pay | Admitting: Obstetrics & Gynecology

## 2017-12-06 ENCOUNTER — Ambulatory Visit (INDEPENDENT_AMBULATORY_CARE_PROVIDER_SITE_OTHER): Payer: Self-pay | Admitting: General Practice

## 2017-12-06 DIAGNOSIS — Z3201 Encounter for pregnancy test, result positive: Secondary | ICD-10-CM

## 2017-12-06 LAB — POCT PREGNANCY, URINE: Preg Test, Ur: POSITIVE — AB

## 2017-12-06 NOTE — Progress Notes (Signed)
Chart reviewed - agree with RN documentation.   

## 2017-12-06 NOTE — Progress Notes (Signed)
Patient presents to office today for UPT. UPT +. Patient reports first positive home test in May. 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 Patient reports taking PNV & metformin. Discussed continuing PNV & to start OB care. Patient verbalized understanding to all & had no questions.

## 2017-12-14 ENCOUNTER — Encounter: Payer: Self-pay | Admitting: *Deleted

## 2018-01-03 ENCOUNTER — Encounter: Payer: Self-pay | Admitting: Advanced Practice Midwife

## 2018-01-03 ENCOUNTER — Ambulatory Visit (INDEPENDENT_AMBULATORY_CARE_PROVIDER_SITE_OTHER): Payer: Self-pay | Admitting: Advanced Practice Midwife

## 2018-01-03 DIAGNOSIS — E282 Polycystic ovarian syndrome: Secondary | ICD-10-CM

## 2018-01-03 DIAGNOSIS — O0990 Supervision of high risk pregnancy, unspecified, unspecified trimester: Secondary | ICD-10-CM | POA: Insufficient documentation

## 2018-01-03 DIAGNOSIS — Z3400 Encounter for supervision of normal first pregnancy, unspecified trimester: Secondary | ICD-10-CM

## 2018-01-03 DIAGNOSIS — A6004 Herpesviral vulvovaginitis: Secondary | ICD-10-CM

## 2018-01-03 DIAGNOSIS — Z3401 Encounter for supervision of normal first pregnancy, first trimester: Secondary | ICD-10-CM

## 2018-01-03 DIAGNOSIS — Z113 Encounter for screening for infections with a predominantly sexual mode of transmission: Secondary | ICD-10-CM

## 2018-01-03 NOTE — Progress Notes (Signed)
Subjective:   Rhonda Avila is a 27 y.o. G1P0000 at 7451w2d by LMP being seen today for her first obstetrical visit.  Her obstetrical history is significant for obesity and PCOS, on metformin . Patient does intend to breast feed. Pregnancy history fully reviewed.  Patient reports no complaints.  HISTORY: OB History  Gravida Para Term Preterm AB Living  1 0 0 0 0 0  SAB TAB Ectopic Multiple Live Births  0 0 0 0 0    # Outcome Date GA Lbr Len/2nd Weight Sex Delivery Anes PTL Lv  1 Current             Last pap smear was done 08/06/16 and was normal  Past Medical History:  Diagnosis Date  . Medical history non-contributory    Past Surgical History:  Procedure Laterality Date  . NO PAST SURGERIES     Family History  Problem Relation Age of Onset  . Hypertension Maternal Grandmother    Social History   Tobacco Use  . Smoking status: Never Smoker  . Smokeless tobacco: Never Used  Substance Use Topics  . Alcohol use: No  . Drug use: Not on file   No Known Allergies Current Outpatient Medications on File Prior to Visit  Medication Sig Dispense Refill  . metFORMIN (GLUCOPHAGE) 1000 MG tablet Take 1,000 mg by mouth 2 (two) times daily with a meal.    . Prenatal Vit-Fe Fumarate-FA (PRENATAL MULTIVITAMIN) TABS tablet Take 1 tablet by mouth daily at 12 noon.    Marland Kitchen. ibuprofen (ADVIL,MOTRIN) 600 MG tablet Take 1 tablet (600 mg total) by mouth every 6 (six) hours as needed for moderate pain. (Patient not taking: Reported on 01/03/2018) 30 tablet 0   No current facility-administered medications on file prior to visit.     Review of Systems Pertinent items noted in HPI and remainder of comprehensive ROS otherwise negative.  Exam   Vitals:   01/03/18 1405  BP: 114/70  Pulse: (!) 58  Weight: 214 lb 4.8 oz (97.2 kg)   Fetal Heart Rate (bpm): 130 with bedside US   Uterus:    AGA  Pelvic Exam: Perineum:    Vulva:    Vagina:     Cervix:    Adnexa:    Bony  Pelvis:   System: General: well-developed, well-nourished female in no acute distress   Breast:  normal appearance, no masses or tenderness   Skin: normal coloration and turgor, no rashes   Neurologic: oriented, normal, negative, normal mood   Extremities: normal strength, tone, and muscle mass, ROM of all joints is normal   HEENT PERRLA, extraocular movement intact and sclera clear, anicteric   Mouth/Teeth mucous membranes moist, pharynx normal without lesions and dental hygiene good   Neck supple and no masses   Cardiovascular: regular rate and rhythm   Respiratory:  no respiratory distress, normal breath sounds   Abdomen: soft, non-tender; bowel sounds normal; no masses,  no organomegaly    Assessment:   Pregnancy: G1P0000 Patient Active Problem List   Diagnosis Date Noted  . Supervision of normal first pregnancy, antepartum 01/03/2018  . 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 Elevated BP 07/10/2013  . Breast pain 07/10/2013   Pt informed that the ultrasound is considered a limited OB ultrasound and is not intended to be a complete ultrasound exam.  Patient also informed that the ultrasound is not being completed with the intent of assessing for fetal or placental anomalies or  any pelvic abnormalities.  Explained that the purpose of today's ultrasound is to assess for  viability.  Patient acknowledges the purpose of the exam and the limitations of the study.       Plan:  1. Supervision of normal first pregnancy, antepartum - [facility] AMB BABYSCRIPTS OPT IN - Culture, OB Urine - Cystic fibrosis gene test - Genetic Screening - Hemoglobinopathy Evaluation - Obstetric Panel, Including HIV - SMN1 COPY NUMBER ANALYSIS (SMA Carrier Screen) - [place] MFM OB COMP + 14 WK; Future - Cervicovaginal ancillary only - HgB A1c   Initial labs drawn. Continue prenatal vitamins. Genetic Screening discussed, NIPS: requested. Ultrasound discussed; fetal anatomic survey:  requested. Problem list reviewed and updated. The nature of [facility] with multiple MDs and other Advanced Practice Providers was explained to patient; also emphasized that residents, students are part of our team. Routine obstetric precautions reviewed. 50% of 45 min visit spent in counseling and coordination of care. No follow-ups on file.

## 2018-01-04 LAB — CERVICOVAGINAL ANCILLARY ONLY
Bacterial vaginitis: POSITIVE — AB
Candida vaginitis: NEGATIVE
Chlamydia: NEGATIVE
Neisseria Gonorrhea: NEGATIVE
Trichomonas: NEGATIVE

## 2018-01-04 MED ORDER — METRONIDAZOLE 500 MG PO TABS: 500.0000 mg | ORAL_TABLET | Freq: Two times a day (BID) | ORAL | 0 refills | Status: DC

## 2018-01-04 NOTE — Addendum Note (Signed)
Addended by: Thressa ShellerHOGAN, HEATHER D on: 01/04/2018 06:55 PM   Modules accepted: Orders

## 2018-01-05 MED FILL — metroNIDAZOLE 500 MG TABS: 500 | 7 days supply | Qty: 14 | Fill #0

## 2018-01-07 LAB — URINE CULTURE, OB REFLEX

## 2018-01-07 LAB — CULTURE, OB URINE

## 2018-01-10 ENCOUNTER — Encounter: Payer: Self-pay | Admitting: General Practice

## 2018-01-10 ENCOUNTER — Other Ambulatory Visit: Payer: Self-pay | Admitting: Student

## 2018-01-10 ENCOUNTER — Telehealth: Payer: Self-pay | Admitting: *Deleted

## 2018-01-10 DIAGNOSIS — O2341 Unspecified infection of urinary tract in pregnancy, first trimester: Secondary | ICD-10-CM

## 2018-01-10 MED ORDER — CEPHALEXIN 500 MG PO CAPS: 500.0000 mg | ORAL_CAPSULE | Freq: Four times a day (QID) | ORAL | 0 refills | Status: DC

## 2018-01-10 MED FILL — CEPHALEXIN 500 MG CAPSULE: 500 | 7 days supply | Qty: 28 | Fill #0

## 2018-01-10 NOTE — Telephone Encounter (Signed)
Pt left VM stating that she has a question about her medication. Please call back.

## 2018-01-11 ENCOUNTER — Encounter: Payer: Self-pay | Admitting: Advanced Practice Midwife

## 2018-01-11 NOTE — Telephone Encounter (Signed)
Patient reports talking to pharmacy about medications that are safe in pregnancy. Phar D told her to check with provider to make sure they are safe. I have explained to patient that both medications are safe in pregnancy and her provider would never call anything in that would be harmful to her or unborn child. Patient voice understand and will go ahead and start medications.

## 2018-01-12 ENCOUNTER — Encounter: Payer: Self-pay | Admitting: *Deleted

## 2018-01-12 LAB — HEMOGLOBIN A1C
Est. average glucose Bld gHb Est-mCnc: 94 mg/dL
Hgb A1c MFr Bld: 4.9 % (ref 4.8–5.6)

## 2018-01-12 LAB — OBSTETRIC PANEL, INCLUDING HIV
Antibody Screen: NEGATIVE
BASOS ABS: 0 10*3/uL (ref 0.0–0.2)
Basos: 0 %
EOS (ABSOLUTE): 0.1 10*3/uL (ref 0.0–0.4)
Eos: 1 %
HIV SCREEN 4TH GENERATION: NONREACTIVE
Hematocrit: 38.8 % (ref 34.0–46.6)
Hemoglobin: 13 g/dL (ref 11.1–15.9)
Hepatitis B Surface Ag: NEGATIVE
Immature Grans (Abs): 0 10*3/uL (ref 0.0–0.1)
Immature Granulocytes: 0 %
LYMPHS ABS: 2.7 10*3/uL (ref 0.7–3.1)
Lymphs: 35 %
MCH: 30 pg (ref 26.6–33.0)
MCHC: 33.5 g/dL (ref 31.5–35.7)
MCV: 90 fL (ref 79–97)
MONOCYTES: 8 %
Monocytes Absolute: 0.6 10*3/uL (ref 0.1–0.9)
NEUTROS ABS: 4.2 10*3/uL (ref 1.4–7.0)
Neutrophils: 56 %
PLATELETS: 251 10*3/uL (ref 150–450)
RBC: 4.33 x10E6/uL (ref 3.77–5.28)
RDW: 14.3 % (ref 12.3–15.4)
RPR Ser Ql: NONREACTIVE
Rh Factor: POSITIVE
Rubella Antibodies, IGG: 12.3 index (ref >0.99)
WBC: 7.6 10*3/uL (ref 3.4–10.8)

## 2018-01-12 LAB — SMN1 COPY NUMBER ANALYSIS (SMA CARRIER SCREENING)

## 2018-01-12 LAB — HEMOGLOBINOPATHY EVALUATION
FERRITIN: 238 ng/mL — AB (ref 15–150)
HGB A2 QUANT: 2.6 % (ref 1.8–3.2)
HGB C: 0 %
HGB F QUANT: 0.9 % (ref 0.0–2.0)
HGB VARIANT: 0 %
Hgb A: 96.5 % (ref 96.4–98.8)
Hgb S: 0 %
Hgb Solubility: NEGATIVE

## 2018-01-12 LAB — CYSTIC FIBROSIS GENE TEST

## 2018-01-17 ENCOUNTER — Telehealth: Payer: Self-pay | Admitting: *Deleted

## 2018-01-17 DIAGNOSIS — Z3400 Encounter for supervision of normal first pregnancy, unspecified trimester: Secondary | ICD-10-CM

## 2018-01-17 DIAGNOSIS — Z148 Genetic carrier of other disease: Secondary | ICD-10-CM

## 2018-01-17 NOTE — Telephone Encounter (Signed)
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 I gave her the appt. She voices understanding.

## 2018-01-17 NOTE — Telephone Encounter (Signed)
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 Please refer to genetic counselor.

## 2018-01-26 ENCOUNTER — Ambulatory Visit (HOSPITAL_COMMUNITY)
Admission: RE | Admit: 2018-01-26 | Discharge: 2018-01-26 | Disposition: A | Discharge status: Home / Self Care | Payer: Self-pay | Source: Ambulatory Visit | Attending: Advanced Practice Midwife | Admitting: Advanced Practice Midwife

## 2018-01-26 DIAGNOSIS — Z148 Genetic carrier of other disease: Secondary | ICD-10-CM | POA: Insufficient documentation

## 2018-01-27 LAB — HEMOGLOBINOPATHY EVALUATION
HGB A: 96.2 % — AB (ref 96.4–98.8)
Hgb A2 Quant: 2.5 % (ref 1.8–3.2)
Hgb C: 0 %
Hgb F Quant: 1.3 % (ref 0.0–2.0)
Hgb S Quant: 0 %
Hgb Variant: 0 %

## 2018-01-31 ENCOUNTER — Ambulatory Visit (INDEPENDENT_AMBULATORY_CARE_PROVIDER_SITE_OTHER): Payer: Self-pay | Admitting: Advanced Practice Midwife

## 2018-01-31 ENCOUNTER — Encounter: Payer: Self-pay | Admitting: Advanced Practice Midwife

## 2018-01-31 VITALS — BP 113/58 | HR 63 | Wt 212.0 lb

## 2018-01-31 DIAGNOSIS — Z3400 Encounter for supervision of normal first pregnancy, unspecified trimester: Secondary | ICD-10-CM

## 2018-01-31 MED ORDER — BUTALBITAL-APAP-CAFFEINE 50-325-40 MG PO TABS: 1.0000 | ORAL_TABLET | Freq: Four times a day (QID) | ORAL | 0 refills | Status: DC | PRN

## 2018-01-31 NOTE — Progress Notes (Signed)
Pt c/o of headache 7days

## 2018-01-31 NOTE — Progress Notes (Signed)
   PRENATAL VISIT NOTE  Subjective:  Rhonda Avila is a 27 y.o. G1P0000 at 6764w2d being seen today for ongoing prenatal care.  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  Patient reports headache.  Contractions: Not present. Vag. Bleeding: None.  Movement: Absent. Denies leaking of fluid.   The following portions of the patient's history were reviewed and updated as appropriate: allergies, current medications, past family history, past medical history, past social history, past surgical history and problem list. Problem list updated.  Objective:   Vitals:   01/31/18 1608  BP: (!) 113/58  Pulse: 63  Weight: 212 lb (96.2 kg)    Fetal Status: Fetal Heart Rate (bpm): 154   Movement: Absent     General:  Alert, oriented and cooperative. Patient is in no acute distress.  Skin: Skin is warm and dry. No rash noted.   Cardiovascular: Normal heart rate noted  Respiratory: Normal respiratory effort, no problems with respiration noted  Abdomen: Soft, gravid, appropriate for gestational age.  Pain/Pressure: Present     Pelvic: Cervical exam deferred        Extremities: Normal range of motion.  Edema: None  Mental Status: Normal mood and affect. Normal behavior. Normal judgment and thought content.   Assessment and Plan:  Pregnancy: G1P0000 at 5264w2d  1. Supervision of normal first pregnancy, antepartum - Routine care - Will try ibuprofen or fioricet for headache if no improvement could consider a triptan as immitrex has worked for her headaches in the past.   Preterm labor symptoms and general obstetric precautions including but not limited to vaginal bleeding, contractions,  leaking of fluid and fetal movement were reviewed in detail with the patient. Please refer to After Visit Summary for other counseling recommendations.  Return in about 4 weeks (around 02/28/2018).  Future Appointments  Date Time Provider Department Center  03/03/2018  2:30 PM [facility] US 1 [facility]    Thressa ShellerHeather Hogan, CNM

## 2018-01-31 NOTE — Patient Instructions (Addendum)
OK to take ibuprofen 800mg  1 time    Safe Medications in Pregnancy   Acne: Benzoyl Peroxide Salicylic Acid  Backache/Headache: Tylenol: 2 regular strength every 4 hours OR              2 Extra strength every 6 hours  Colds/Coughs/Allergies: Benadryl (alcohol free) 25 mg every 6 hours as needed Breath right strips Claritin Cepacol throat lozenges Chloraseptic throat spray Cold-Eeze- up to three times per day Cough drops, alcohol free Flonase (by prescription only) Guaifenesin Mucinex Robitussin DM (plain only, alcohol free) Saline nasal spray/drops Sudafed (pseudoephedrine) & Actifed ** use only after [redacted] weeks gestation and if you do not have high blood pressure Tylenol Vicks Vaporub Zinc lozenges Zyrtec   Constipation: Colace Ducolax suppositories Fleet enema Glycerin suppositories Metamucil Milk of magnesia Miralax Senokot Smooth move tea  Diarrhea: Kaopectate Imodium A-D  *NO pepto Bismol  Hemorrhoids: Anusol Anusol HC Preparation H Tucks  Indigestion: Tums Maalox Mylanta Zantac  Pepcid  Insomnia: Benadryl (alcohol free) 25mg  every 6 hours as needed Tylenol PM Unisom, no Gelcaps  Leg Cramps: Tums MagGel  Nausea/Vomiting:  Bonine Dramamine Emetrol Ginger extract Sea bands Meclizine  Nausea medication to take during pregnancy:  Unisom (doxylamine succinate 25 mg tablets) Take one tablet daily at bedtime. If symptoms are not adequately controlled, the dose can be increased to a maximum recommended dose of two tablets daily (1/2 tablet in the morning, 1/2 tablet mid-afternoon and one at bedtime). Vitamin B6 100mg  tablets. Take one tablet twice a day (up to 200 mg per day).  Skin Rashes: Aveeno products Benadryl cream or 25mg  every 6 hours as needed Calamine Lotion 1% cortisone cream  Yeast infection: Gyne-lotrimin 7 Monistat 7   **If taking multiple medications, please check labels to avoid duplicating the same active  ingredients **take medication as directed on the label ** Do not exceed 4000 mg of tylenol in 24 hours **Do not take medications that contain aspirin or ibuprofen

## 2018-01-31 NOTE — Progress Notes (Signed)
   PRENATAL VISIT NOTE  Subjective:  Rhonda Avila is a 27 y.o. G1P0000 at 3864w2d being seen today for ongoing prenatal care.  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  Patient reports headache for 7 days. She takes Tylenol x3 per day but it still bothers her. She reports good hydration. Nothing else helps the pain. She denies RUQ pain, vision changes, peripheral edema, or other complaints.  Contractions: Not present. Vag. Bleeding: None.  Movement: Absent. Denies leaking of fluid.  The following portions of the patient's history were reviewed and updated as appropriate: allergies, current medications, past family history, past medical history, past social history, past surgical history and problem list. Problem list updated.  Objective:   Vitals:   01/31/18 1608  BP: (!) 113/58  Pulse: 63  Weight: 212 lb (96.2 kg)    Fetal Status: Fetal Heart Rate (bpm): 154   Movement: Absent     General:  Alert, oriented and cooperative. Patient is in no acute distress.  Skin: Skin is warm and dry. No rash noted.   Cardiovascular: Normal heart rate noted  Respiratory: Normal respiratory effort, no problems with respiration noted  Abdomen: Soft, gravid, appropriate for gestational age.  Pain/Pressure: Present     Pelvic: Cervical exam deferred        Extremities: Normal range of motion.  Edema: None  Mental Status: Normal mood and affect. Normal behavior. Normal judgment and thought content.   Assessment and Plan:  Pregnancy: G1P0000 at 2064w2d  1. Supervision of normal first pregnancy, antepartum  - gender results of Panorama placed in sealed envelope for gender reveal  - f/u  and anatomy scan in 4 weeks.  Preterm labor symptoms and general obstetric precautions including but not limited to vaginal bleeding, contractions, leaking of fluid and fetal movement were reviewed in detail with the patient. Please refer to After Visit Summary for other counseling recommendations.  Return in about 4 weeks (around 02/28/2018).  Future Appointments  Date Time Provider Department Center  03/03/2018  2:30 PM [facility] US 1 [facility]    Margarita RanaHarrison D Elishia Kaczorowski, Student-PA 4:40pm

## 2018-02-03 ENCOUNTER — Telehealth (HOSPITAL_COMMUNITY): Payer: Self-pay | Admitting: *Deleted

## 2018-02-04 ENCOUNTER — Telehealth (HOSPITAL_COMMUNITY): Payer: Self-pay | Admitting: *Deleted

## 2018-02-04 NOTE — Telephone Encounter (Signed)
Pt name and DOB verified, nml hgb electrophoresis result given.  Pt voiced understanding.

## 2018-02-09 ENCOUNTER — Telehealth (HOSPITAL_COMMUNITY): Payer: Self-pay | Admitting: *Deleted

## 2018-02-28 ENCOUNTER — Ambulatory Visit (INDEPENDENT_AMBULATORY_CARE_PROVIDER_SITE_OTHER): Payer: Self-pay | Admitting: Advanced Practice Midwife

## 2018-02-28 ENCOUNTER — Encounter: Payer: Self-pay | Admitting: Obstetrics & Gynecology

## 2018-02-28 ENCOUNTER — Encounter: Payer: Self-pay | Admitting: Advanced Practice Midwife

## 2018-02-28 VITALS — BP 134/66 | HR 79 | Wt 212.0 lb

## 2018-02-28 DIAGNOSIS — Z3400 Encounter for supervision of normal first pregnancy, unspecified trimester: Secondary | ICD-10-CM

## 2018-02-28 NOTE — Patient Instructions (Signed)
AREA PEDIATRIC/FAMILY [place] [address] [place], [place]  [address] Phone - [phone]   Fax - [phone]  [facility] [address] [place], [place] [address] Phone - [phone]   Fax - [place] [address] [place], [place]  [address] Phone - [phone]   Fax - [phone]  [place]ine [place]. [address] [place] [address] [place], [place]  [address] Phone - [phone]   Fax - [phone]  [place] [address] [place], [place]  [address] Phone - [phone]   Fax - [phone]  [facility] [address] [place], [place]  [address] Phone - [phone]   Fax - [place] [address] [place], [place]  [address] Phone - [phone]   Fax - [phone]  [place] [address] [place], [place] [address] [place], [place]  [address] Phone - [phone]   Fax - [place] [address] [place], [place]  [address] Phone - [phone]   Fax - [phone] [facility] [place]. [address] [place], [place]  [address] Phone - [phone]   Fax - [phone]  [place] [address] [place], [place]  [address] Phone - [phone]   Fax - [phone]  [facility] [place], [place], [place]  [address] Phone - [phone]   Fax - [place] [address] [place], [place]  [address] Phone - [phone]   Fax - [phone]  [facility] [address], [place], [place]  [address] Phone - [place], [place]  [address] Phone - [phone]   Fax - [place] [address], [place], [place]  [address] Phone - [phone]   Fax - [phone]  [place] [address] [place], [place]  [address] Phone - [phone]   Fax - [place]. [place], [place]  [address] Phone - [phone]   Fax - [place], [place], [place]  [address] Phone - [phone]   Fax - [place] [address] [place], [place]  [address] Phone - [phone]   Fax - [phone]  DAVID RUBIN [address], [place], [place]  [address] Phone - [phone]   Fax - [place] [address] [place], [place]  [address] Phone - [phone]   Fax - [phone]  [place] [address] [place], [place]  [address] Phone - [phone]   Fax - [phone] [place]nal[place] [address] [place], [place]  [address] Phone - [phone]   Fax - [place] [address] [place], [place]  [address] Phone - [phone]   Fax - [place] [address] [place], [place]  [address] Phone - [phone]   Fax - [phone]  Parole MD [address] [place] [address] Phone [phone]  Fax [phone]  Deciding about Circumcision in Baby Boys  (The Basics)  What is circumcision?  Circumcision  is a surgery that removes the skin that covers the tip of the penis, called the "foreskin" Circumcision is usually done when a boy is between 40 and 35 days old. In the [place], circumcision is common. In some other countries, fewer boys are circumcised. Circumcision is a common tradition in some religions.  Should I have my baby boy circumcised?  There is no easy answer. Circumcision has  some benefits. But it also has risks. After talking with your doctor, you will have to decide for yourself what is right for your family.  What are the benefits of circumcision?  Circumcised boys seem to have slightly lower rates of: ?Urinary tract infections ?Swelling of the opening at the tip of the penis Circumcised men seem to have slightly lower rates of: ?Urinary tract infections ?Swelling of the opening at the tip of the penis ?Penis cancer ?HIV and other infections that you catch during sex ?Cervical cancer in the women they have sex with Even so, in the [place], the risks of these problems are small - even in boys and men who have not been circumcised. Plus, boys and men who are not circumcised can reduce these extra risks by: ?Cleaning their penis well ?Using condoms during sex  What are the risks of circumcision?  Risks include: ?Bleeding or infection from the surgery ?Damage to or amputation of the penis ?A chance that the doctor will cut off too much or not enough of the foreskin ?A chance that sex won't feel as good later in life Only about 1 out of every 200 circumcisions leads to problems. There is also a chance that your health insurance won't pay for circumcision.  How is circumcision done in baby boys?  First, the baby gets medicine for pain relief. This might be a cream on the skin or a shot into the base of the penis. Next, the doctor cleans the baby's penis well. Then he or she uses special tools to cut off the foreskin. Finally, the doctor wraps a bandage (called gauze) around the baby's penis. If you have your baby circumcised, his doctor or nurse will give you instructions on how to care for him after the surgery. It is important that you follow those instructions carefully.  Childbirth Education Options: [facility] Department Classes:  Childbirth education classes can help you get ready for a positive parenting experience. You can also  meet other expectant parents and get free stuff for your baby. Each class runs for five weeks on the same night and costs $45 for the mother-to-be and her support person. Medicaid covers the cost if you are eligible. Call [phone] to register. [facility] Childbirth Education:  [phone] or [phone] or [email or web address]  Baby & Me Class: Discuss newborn & infant parenting and family adjustment issues with other new mothers in a relaxed environment. Each week brings a new speaker or baby-centered activity. We encourage new mothers to join [place] every Thursday at 11:00am. Babies birth until crawling. No registration or fee. Daddy [employer]: This course offers Dads-to-be the tools and knowledge needed to feel confident on their journey to becoming new fathers. Experienced dads, who have been trained as coaches, teach dads-to-be how to hold, comfort, diaper, swaddle and play with their infant while being able to support the new mom as well. A class for men taught by men. $25/dad Big Brother/Big Sister: Let your children share in the joy of a new brother or sister in this special class designed just for  them. Class includes discussion about how families care for babies: swaddling, holding, diapering, safety as well as how they can be helpful in their new role. This class is designed for children ages 42 to 77, but any age is welcome. Please register each child individually. $5/child  Mom Talk: This mom-led group offers support and connection to mothers as they journey through the adjustments and struggles of that sometimes overwhelming first year after the birth of a child. Tuesdays at 10:00am and Thursdays at 6:00pm. Babies welcome. No registration or fee. Breastfeeding Support Group: This group is a mother-to-mother support circle where moms have the opportunity to share their breastfeeding experiences. A Lactation Consultant is present for questions and concerns. Meets each Tuesday at  11:00am. No fee or registration. Breastfeeding Your Baby: Learn what to expect in the first days of breastfeeding your newborn.  This class will help you feel more confident with the skills needed to begin your breastfeeding experience. Many new mothers are concerned about breastfeeding after leaving the hospital. This class will also address the most common fears and challenges about breastfeeding during the first few weeks, months and beyond. (call for fee) Comfort Techniques and Tour: This 2 hour interactive class will provide you the opportunity to learn & practice hands-on techniques that can help relieve some of the discomfort of labor and encourage your baby to rotate toward the best position for birth. You and your partner will be able to try a variety of labor positions with birth balls and rebozos as well as practice breathing, relaxation, and visualization techniques. A tour of the [facility] is included with this class. $20 per registrant and support person Childbirth Class- Weekend Option: This class is a Weekend version of our Birth & Baby series. It is designed for parents who have a difficult time fitting several weeks of classes into their schedule. It covers the care of your newborn and the basics of labor and childbirth. It also includes a [place] [facility] and lunch. The class is held two consecutive days: beginning on Friday evening from 6:30 - 8:30 p.m. and the next day, Saturday from 9 a.m. - 4 p.m. (call for fee) Doren Custard Class: Interested in a waterbirth?  This informational class will help you discover whether waterbirth is the right fit for you. Education about waterbirth itself, supplies you would need and how to assemble your support team is what you can expect from this class. Some obstetrical practices require this class in order to pursue a waterbirth. (Not all obstetrical practices offer waterbirth-check with your  healthcare provider.) Register only the expectant mom, but you are encouraged to bring your partner to class! Required if planning waterbirth, no fee. Infant/Child CPR: Parents, grandparents, babysitters, and friends learn Cardio-Pulmonary Resuscitation skills for infants and children. You will also learn how to treat both conscious and unconscious choking in infants and children. This Family & Friends program does not offer certification. Register each participant individually to ensure that enough mannequins are available. (Call for fee) Grandparent Love: Expecting a grandbaby? This class is for you! Learn about the latest infant care and safety recommendations and ways to support your own child as he or she transitions into the parenting role. Taught by Registered Nurses who are childbirth instructors, but most importantly...they are grandmothers too! $10/person. Childbirth Class- Natural Childbirth: This series of 5 weekly classes is for expectant parents who want to learn and practice natural methods of coping with the process  of labor and childbirth. Relaxation, breathing, massage, visualization, role of the partner, and helpful positioning are highlighted. Participants learn how to be confident in their body's ability to give birth. This class will empower and help parents make informed decisions about their own care. Includes discussion that will help new parents transition into the immediate postpartum period. [facility] is included. We suggest taking this class between 25-32 weeks, but it's only a recommendation. $75 per registrant and one support person or $30 Medicaid. Childbirth Class- 3 week Series: This option of 3 weekly classes helps you and your labor partner prepare for childbirth. Newborn care, labor & birth, cesarean birth, pain management, and comfort techniques are discussed and a [place] [facility] is included. The class  meets at the same time, on the same day of the week for 3 consecutive weeks beginning with the starting date you choose. $60 for registrant and one support person.  Marvelous Multiples: Expecting twins, triplets, or more? This class covers the differences in labor, birth, parenting, and breastfeeding issues that face multiples' parents. NICU tour is included. Led by a Certified Childbirth Educator who is the mother of twins. No fee. Caring for Baby: This class is for expectant and adoptive parents who want to learn and practice the most up-to-date newborn care for their babies. Focus is on birth through the first six weeks of life. Topics include feeding, bathing, diapering, crying, umbilical cord care, circumcision care and safe sleep. Parents learn to recognize symptoms of illness and when to call the pediatrician. Register only the mom-to-be and your partner or support person can plan to come with you! $10 per registrant and support person Childbirth Class- online option: This online class offers you the freedom to complete a Birth and Baby series in the comfort of your own home. The flexibility of this option allows you to review sections at your own pace, at times convenient to you and your support people. It includes additional video information, animations, quizzes, and extended activities. Get organized with helpful eClass tools, checklists, and trackers. Once you register online for the class, you will receive an email within a few days to accept the invitation and begin the class when the time is right for you. The content will be available to you for 60 days. $60 for 60 days of online access for you and your support people.  Local Doulas: Natural Baby Doulas [email or web address] Tel: [phone] [email or web address] [email or web address] [phone] [email or web address] The Labor Hassell Halim  (also do waterbirth tub  rental) [phone] [email or web address] Triad Birth Doula [phone] [email or web address].fi [facility] Rhythms  [phone] [email or web address] [employer] (PADA) [email or web address] [place] Birth and Baby  [email or web address] Considering Waterbirth? Guide for patients at [facility] [employer]  Why consider waterbirth?  . Gentle birth for babies . Less pain medicine used in labor . May allow for passive descent/less pushing . May reduce perineal tears  . More mobility and instinctive maternal position changes . Increased maternal relaxation . Reduced blood pressure in labor  Is waterbirth safe? What are the risks of infection, drowning or other complications?  . Infection: o Very low risk (3.7 % for tub vs 4.8% for bed) o 7 in 8000 waterbirths with documented infection o Poorly cleaned equipment most common cause o Slightly lower group B strep transmission rate  . Drowning o Maternal:  - Very low risk   - Related to seizures or fainting o Newborn:  -  Very low risk. No evidence of increased risk of respiratory problems in multiple large studies - Physiological protection from breathing under water - Avoid underwater birth if there are any fetal complications - Once baby's head is out of the water, keep it out.  . Birth complication o Some reports of cord trauma, but risk decreased by bringing baby to surface gradually o No evidence of increased risk of shoulder dystocia. Mothers can usually change positions faster in water than in a bed, possibly aiding the maneuvers to free the shoulder.   You must attend a Doren Custard class at [facility]  3rd Wednesday of every month from 7-9pm  [employer] by calling [phone] or online at [email or web address]  Bring [place] the certificate from the class to  your prenatal appointment  Meet with a midwife at 36 weeks to see if you can still plan a waterbirth and to sign the consent.   Purchase or rent the following supplies:   Water Birth Pool (Birth Pool in a Box or [place] for instance)  (Tubs start ~$125)  Single-use disposable tub liner designed for your brand of tub  New garden hose labeled "lead-free", "suitable for drinking water",  Electric drain pump to remove water (We recommend 792 gallon per hour or greater pump.)   Separate garden hose to remove the dirty water  Fish net  Bathing suit top (optional)  Long-handled mirror (optional)  Places to purchase or rent supplies  GotWebTools.is for tub purchases and supplies  Waterbirthsolutions.com for tub purchases and supplies  The Labor Ladies (www.[email or web address]) $275 for tub rental/set-up & take down/kit   [employer] (http://www.fleming.com/.htm) Information regarding doulas (labor support) who provide pool rentals  Our practice has a Birth Pool in a Box tub at the hospital that you may borrow on a first-come-first-served basis. It is your responsibility to to set up, clean and break down the tub. We cannot guarantee the availability of this tub in advance. You are responsible for bringing all accessories listed above. If you do not have all necessary supplies you cannot have a waterbirth.    Things that would prevent you from having a waterbirth:  Premature, <37wks  Previous cesarean birth  Presence of thick meconium-stained fluid  Multiple gestation (Twins, triplets, etc.)  Uncontrolled diabetes or gestational diabetes requiring medication  Hypertension requiring medication or diagnosis of pre-eclampsia  Heavy vaginal bleeding  Non-reassuring fetal heart rate  Active infection (MRSA, etc.). Group B Strep is NOT a contraindication for  waterbirth.  If your labor has to be induced and induction method requires continuous   monitoring of the baby's heart rate  Other risks/issues identified by your obstetrical provider  Please remember that birth is unpredictable. Under certain unforeseeable circumstances your provider may advise against giving birth in the tub. These decisions will be made on a case-by-case basis and with the safety of you and your baby as our highest priority.

## 2018-02-28 NOTE — Progress Notes (Signed)
   PRENATAL VISIT NOTE  Subjective:  Rhonda Avila is a 27 y.o. G1P0000 at 1079w2d being seen today for ongoing prenatal care.  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  Patient reports no complaints.  Contractions: Not present. Vag. Bleeding: None.  Movement: Present. Denies leaking of fluid.   The following portions of the patient's history were reviewed and updated as appropriate: allergies, current medications, past family history, past medical history, past social history, past surgical history and problem list. Problem list updated.  Objective:   Vitals:   02/28/18 1538  BP: 134/66  Pulse: 79  Weight: 212 lb (96.2 kg)    Fetal Status: Fetal Heart Rate (bpm): 152   Movement: Present     General:  Alert, oriented and cooperative. Patient is in no acute distress.  Skin: Skin is warm and dry. No rash noted.   Cardiovascular: Normal heart rate noted  Respiratory: Normal respiratory effort, no problems with respiration noted  Abdomen: Soft, gravid, appropriate for gestational age.  Pain/Pressure: Present     Pelvic: Cervical exam deferred        Extremities: Normal range of motion.  Edema: None  Mental Status: Normal mood and affect. Normal behavior. Normal judgment and thought content.   Assessment and Plan:  Pregnancy: G1P0000 at 6379w2d  1. Supervision of normal first pregnancy, antepartum - Routine care - Culture, OB Urine - declined AFP today  Preterm labor symptoms and general obstetric precautions including but not limited to vaginal bleeding, contractions, leaking of fluid and fetal movement were reviewed in detail with the patient. Please refer to After  Visit Summary for other counseling recommendations.  Return in about 4 weeks (around 03/28/2018).  Future Appointments  Date Time Provider Department Center  03/03/2018  2:30 PM [facility] US 1 [facility]    Thressa ShellerHeather Ervine Witucki, CNM

## 2018-03-03 ENCOUNTER — Ambulatory Visit (HOSPITAL_COMMUNITY)
Admission: RE | Admit: 2018-03-03 | Discharge: 2018-03-03 | Disposition: A | Discharge status: Home / Self Care | Payer: Self-pay | Source: Ambulatory Visit | Attending: Advanced Practice Midwife | Admitting: Advanced Practice Midwife

## 2018-03-03 ENCOUNTER — Other Ambulatory Visit: Payer: Self-pay | Admitting: Advanced Practice Midwife

## 2018-03-03 DIAGNOSIS — Z3402 Encounter for supervision of normal first pregnancy, second trimester: Secondary | ICD-10-CM | POA: Insufficient documentation

## 2018-03-03 DIAGNOSIS — O99212 Obesity complicating pregnancy, second trimester: Secondary | ICD-10-CM

## 2018-03-03 DIAGNOSIS — Z3A18 18 weeks gestation of pregnancy: Secondary | ICD-10-CM | POA: Insufficient documentation

## 2018-03-03 DIAGNOSIS — Z3400 Encounter for supervision of normal first pregnancy, unspecified trimester: Secondary | ICD-10-CM

## 2018-03-03 DIAGNOSIS — O2691 Pregnancy related conditions, unspecified, first trimester: Secondary | ICD-10-CM

## 2018-03-03 DIAGNOSIS — Z363 Encounter for antenatal screening for malformations: Secondary | ICD-10-CM

## 2018-03-03 LAB — URINE CULTURE, OB REFLEX

## 2018-03-03 LAB — CULTURE, OB URINE

## 2018-03-04 ENCOUNTER — Other Ambulatory Visit: Payer: Self-pay | Admitting: Student

## 2018-03-04 ENCOUNTER — Encounter: Payer: Self-pay | Admitting: *Deleted

## 2018-03-04 DIAGNOSIS — O2341 Unspecified infection of urinary tract in pregnancy, first trimester: Secondary | ICD-10-CM

## 2018-03-04 MED ORDER — AMOXICILLIN 875 MG PO TABS: 875.0000 mg | ORAL_TABLET | Freq: Two times a day (BID) | ORAL | 0 refills | Status: DC

## 2018-03-04 MED FILL — AMOXICILLIN 875 MG TABS: 875 | 7 days supply | Qty: 14 | Fill #0

## 2018-03-29 ENCOUNTER — Encounter: Payer: Self-pay | Admitting: Advanced Practice Midwife

## 2018-03-29 ENCOUNTER — Ambulatory Visit (INDEPENDENT_AMBULATORY_CARE_PROVIDER_SITE_OTHER): Payer: Self-pay | Admitting: Advanced Practice Midwife

## 2018-03-29 VITALS — BP 114/60 | HR 79 | Wt 211.1 lb

## 2018-03-29 DIAGNOSIS — Z3400 Encounter for supervision of normal first pregnancy, unspecified trimester: Secondary | ICD-10-CM

## 2018-03-29 DIAGNOSIS — Z23 Encounter for immunization: Secondary | ICD-10-CM

## 2018-03-29 NOTE — Progress Notes (Signed)
   PRENATAL VISIT NOTE  Subjective:  Rhonda Avila is a 27 y.o. G1P0000 at [redacted]w[redacted]d being seen today for ongoing prenatal care.  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  Patient reports no complaints.  Contractions: Not present. Vag. Bleeding: None.  Movement: Present. Denies leaking of fluid.   The following portions of the patient's history were reviewed and updated as appropriate: allergies, current medications, past family history, past medical history, past social history, past surgical history and problem list. Problem list updated.  Objective:   Vitals:   03/29/18 0915  BP: 114/60  Pulse: 79  Weight: 211 lb 1.6 oz (95.8 kg)    Fetal Status: Fetal Heart Rate (bpm): 166 Fundal Height: 22 cm Movement: Present     General:  Alert, oriented and cooperative. Patient is in no acute distress.  Skin: Skin is warm and dry. No rash noted.   Cardiovascular: Normal heart rate noted  Respiratory: Normal respiratory effort, no problems with respiration noted  Abdomen: Soft, gravid, appropriate for gestational age.  Pain/Pressure: Present     Pelvic: Cervical exam deferred        Extremities: Normal range of motion.  Edema: None  Mental Status: Normal mood and affect. Normal behavior. Normal judgment and thought content.   Assessment and Plan:  Pregnancy: G1P0000 at [redacted]w[redacted]d  1. Supervision of normal first pregnancy, antepartum - Routine care - 28 week labs and GTT at next visit  - Flu Vaccine QUAD 36+ mos IM - Glucose Tolerance, 2 Hours w/1 Hour; Future - CBC; Future - HIV Antibody (routine testing w rflx); Future - RPR; Future  Preterm labor symptoms and general  obstetric precautions including but not limited to vaginal bleeding, contractions, leaking of fluid and fetal movement were reviewed in detail with the patient. Please refer to After Visit Summary for other counseling recommendations.  Return in about 5 weeks (around 05/03/2018).  No future appointments.  Thressa Sheller, CNM

## 2018-05-02 ENCOUNTER — Other Ambulatory Visit: Payer: Self-pay

## 2018-05-03 ENCOUNTER — Other Ambulatory Visit: Payer: Self-pay

## 2018-05-03 ENCOUNTER — Encounter: Payer: Self-pay | Admitting: Student

## 2018-05-04 ENCOUNTER — Other Ambulatory Visit: Payer: Self-pay

## 2018-05-04 ENCOUNTER — Ambulatory Visit (INDEPENDENT_AMBULATORY_CARE_PROVIDER_SITE_OTHER): Payer: Self-pay | Admitting: Nurse Practitioner

## 2018-05-04 VITALS — BP 118/67 | HR 78 | Wt 217.0 lb

## 2018-05-04 DIAGNOSIS — Z3400 Encounter for supervision of normal first pregnancy, unspecified trimester: Secondary | ICD-10-CM

## 2018-05-04 DIAGNOSIS — Z23 Encounter for immunization: Secondary | ICD-10-CM

## 2018-05-04 DIAGNOSIS — Z3402 Encounter for supervision of normal first pregnancy, second trimester: Secondary | ICD-10-CM

## 2018-05-04 DIAGNOSIS — Z3A27 27 weeks gestation of pregnancy: Secondary | ICD-10-CM

## 2018-05-04 NOTE — Patient Instructions (Addendum)
Childbirth Education Options: [facility] Department Classes:  Childbirth education classes can help you get ready for a positive parenting experience. You can also meet other expectant parents and get free stuff for your baby. Each class runs for five weeks on the same night and costs $45 for the mother-to-be and her support person. Medicaid covers the cost if you are eligible. Call [phone] to register. [facility] Childbirth Education:  [phone] or [phone] or [email or web address]  Baby & Me Class: Discuss newborn & infant parenting and family adjustment issues with other new mothers in a relaxed environment. Each week brings a new speaker or baby-centered activity. We encourage new mothers to join [place] every Thursday at 11:00am. Babies birth until crawling. No registration or fee. Daddy [employer]: This course offers Dads-to-be the tools and knowledge needed to feel confident on their journey to becoming new fathers. Experienced dads, who have been trained as coaches, teach dads-to-be how to hold, comfort, diaper, swaddle and play with their infant while being able to support the new mom as well. A class for men taught by men. $25/dad Big Brother/Big Sister: Let your children share in the joy of a new brother or sister in this special class designed just for them. Class includes discussion about how families care for babies: swaddling, holding, diapering, safety as well as how they can be helpful in their new role. This class is designed for children ages 45 to 48, but any age is welcome. Please register each child individually. $5/child  Mom Talk: This mom-led group offers support and connection to mothers as they journey through the adjustments and struggles of that sometimes overwhelming first year after the birth of a child. Tuesdays at 10:00am and Thursdays at 6:00pm. Babies welcome. No registration or fee. Breastfeeding Support Group: This group is a mother-to-mother  support circle where moms have the opportunity to share their breastfeeding experiences. A Lactation Consultant is present for questions and concerns. Meets each Tuesday at 11:00am. No fee or registration. Breastfeeding Your Baby: Learn what to expect in the first days of breastfeeding your newborn.  This class will help you feel more confident with the skills needed to begin your breastfeeding experience. Many new mothers are concerned about breastfeeding after leaving the hospital. This class will also address the most common fears and challenges about breastfeeding during the first few weeks, months and beyond. (call for fee) Comfort Techniques and Tour: This 2 hour interactive class will provide you the opportunity to learn & practice hands-on techniques that can help relieve some of the discomfort of labor and encourage your baby to rotate toward the best position for birth. You and your partner will be able to try a variety of labor positions with birth balls and rebozos as well as practice breathing, relaxation, and visualization techniques. A tour of the [facility] is included with this class. $20 per registrant and support person Childbirth Class- Weekend Option: This class is a Weekend version of our Birth & Baby series. It is designed for parents who have a difficult time fitting several weeks of classes into their schedule. It covers the care of your newborn and the basics of labor and childbirth. It also includes a [place] of [facility] and lunch. The class is held two consecutive days: beginning on Friday evening from 6:30 - 8:30 p.m. and the next day, Saturday from 9 a.m. - 4 p.m. (call for fee) Doren Custard Class: Interested in a waterbirth?  This  informational class will help you discover whether waterbirth is the right fit for you. Education about waterbirth itself, supplies you would need and how to assemble your support team is what you can  expect from this class. Some obstetrical practices require this class in order to pursue a waterbirth. (Not all obstetrical practices offer waterbirth-check with your healthcare provider.) Register only the expectant mom, but you are encouraged to bring your partner to class! Required if planning waterbirth, no fee. Infant/Child CPR: Parents, grandparents, babysitters, and friends learn Cardio-Pulmonary Resuscitation skills for infants and children. You will also learn how to treat both conscious and unconscious choking in infants and children. This Family & Friends program does not offer certification. Register each participant individually to ensure that enough mannequins are available. (Call for fee) Grandparent Love: Expecting a grandbaby? This class is for you! Learn about the latest infant care and safety recommendations and ways to support your own child as he or she transitions into the parenting role. Taught by Registered Nurses who are childbirth instructors, but most importantly...they are grandmothers too! $10/person. Childbirth Class- Natural Childbirth: This series of 5 weekly classes is for expectant parents who want to learn and practice natural methods of coping with the process of labor and childbirth. Relaxation, breathing, massage, visualization, role of the partner, and helpful positioning are highlighted. Participants learn how to be confident in their body's ability to give birth. This class will empower and help parents make informed decisions about their own care. Includes discussion that will help new parents transition into the immediate postpartum period. Maternity Care Center Tour of [facility] is included. We suggest taking this class between 25-32 weeks, but it's only a recommendation. $75 per registrant and one support person or $30 Medicaid. Childbirth Class- 3 week Series: This option of 3 weekly classes helps you and your labor partner prepare for childbirth. Newborn  care, labor & birth, cesarean birth, pain management, and comfort techniques are discussed and a Maternity Care Center Tour of [facility] is included. The class meets at the same time, on the same day of the week for 3 consecutive weeks beginning with the starting date you choose. $60 for registrant and one support person.  Marvelous Multiples: Expecting twins, triplets, or more? This class covers the differences in labor, birth, parenting, and breastfeeding issues that face multiples' parents. NICU tour is included. Led by a Certified Childbirth Educator who is the mother of twins. No fee. Caring for Baby: This class is for expectant and adoptive parents who want to learn and practice the most up-to-date newborn care for their babies. Focus is on birth through the first six weeks of life. Topics include feeding, bathing, diapering, crying, umbilical cord care, circumcision care and safe sleep. Parents learn to recognize symptoms of illness and when to call the pediatrician. Register only the mom-to-be and your partner or support person can plan to come with you! $10 per registrant and support person Childbirth Class- online option: This online class offers you the freedom to complete a Birth and Baby series in the comfort of your own home. The flexibility of this option allows you to review sections at your own pace, at times convenient to you and your support people. It includes additional video information, animations, quizzes, and extended activities. Get organized with helpful eClass tools, checklists, and trackers. Once you register online for the class, you will receive an email within a few days to accept the invitation and begin the class when the time   is right for you. The content will be available to you for 60 days. $60 for 60 days of online access for you and your support people.  Local Doulas: Natural Baby Doulas [email or web address] Tel:  [phone] [email or web address] [employer] [phone] [email or web address] The Labor Hassell Halim  (also do waterbirth tub rental) [phone] [email or web address] Triad Birth Doula [phone] [email or web address].fi Sacred Rhythms  [phone] https://[email or web address]-[email or web address]/ [employer] (PADA) [email or web address] [place] Birth and Baby  [email or web address] Considering Waterbirth? Guide for patients at [facility] [employer]  Why consider waterbirth?  . Gentle birth for babies . Less pain medicine used in labor . May allow for passive descent/less pushing . May reduce perineal tears  . More mobility and instinctive maternal position changes . Increased maternal relaxation . Reduced blood pressure in labor  Is waterbirth safe? What are the risks of infection, drowning or other complications?  . Infection: o Very low risk (3.7 % for tub vs 4.8% for bed) o 7 in 8000 waterbirths with documented infection o Poorly cleaned equipment most common cause o Slightly lower group B strep transmission rate  . Drowning o Maternal:  - Very low risk   - Related to seizures or fainting o Newborn:  - Very low risk. No evidence of increased risk of respiratory problems in multiple large studies - Physiological protection from breathing under water - Avoid underwater birth if there are any fetal complications - Once baby's head is out of the water, keep it out.  . Birth complication o Some reports of cord trauma, but risk decreased by bringing baby to surface gradually o No evidence of increased risk of shoulder dystocia. Mothers can usually change positions faster in water than in a bed, possibly aiding the maneuvers to free the shoulder.   You must attend a Doren Custard class at [facility]  3rd Wednesday of every month from 7-9pm  [employer] by calling [phone] or online at [email or web address]  Bring [place] the certificate from the class to your prenatal appointment  Meet with a midwife at 36 weeks to see if you can still plan a waterbirth and to sign the consent.   Purchase or rent the following supplies:   Water Birth Pool (Birth Pool in a Box or [place] for instance)  (Tubs start ~$125)  Single-use disposable tub liner designed for your brand of tub  New garden hose labeled "lead-free", "suitable for drinking water",  Electric drain pump to remove water (We recommend 792 gallon per hour or greater pump.)   Separate garden hose to remove the dirty water  Fish net  Bathing suit top (optional)  Long-handled mirror (optional)  Places to purchase or rent supplies  GotWebTools.is for tub purchases and supplies  Waterbirthsolutions.com for tub purchases and supplies  The Labor Ladies (www.[email or web address]) $275 for tub rental/set-up & take down/kit   [employer] (http://www.fleming.com/.htm) Information regarding doulas (labor support) who provide pool rentals  Our practice has a Birth Pool in a Box tub at the hospital that you may borrow on a first-come-first-served basis. It is your responsibility to to set up, clean and break down the tub. We cannot guarantee the availability of this tub in advance. You are responsible for bringing all accessories listed above. If you do not have all necessary supplies you cannot have a waterbirth.    Things that would prevent you from having a waterbirth:  Premature, <37wks  Previous cesarean birth  Presence of thick meconium-stained fluid  Multiple gestation (Twins,  triplets, etc.)  Uncontrolled diabetes or gestational diabetes requiring medication  Hypertension requiring medication or diagnosis of pre-eclampsia  Heavy vaginal bleeding  Non-reassuring fetal  heart rate  Active infection (MRSA, etc.). Group B Strep is NOT a contraindication for  waterbirth.  If your labor has to be induced and induction method requires continuous  monitoring of the baby's heart rate  Other risks/issues identified by your obstetrical provider  Please remember that birth is unpredictable. Under certain unforeseeable circumstances your provider may advise against giving birth in the tub. These decisions will be made on a case-by-case basis and with the safety of you and your baby as our highest priority.       Braxton Hicks Contractions Contractions of the uterus can occur throughout pregnancy, but they are not always a sign that you are in labor. You may have practice contractions called Braxton Hicks contractions. These false labor contractions are sometimes confused with true labor. What are [place] contractions? Braxton Hicks contractions are tightening movements that occur in the muscles of the uterus before labor. Unlike true labor contractions, these contractions do not result in opening (dilation) and thinning of the cervix. Toward the end of pregnancy (32-34 weeks), Braxton Hicks contractions can happen more often and may become stronger. These contractions are sometimes difficult to tell apart from true labor because they can be very uncomfortable. You should not feel embarrassed if you go to the hospital with false labor. Sometimes, the only way to tell if you are in true labor is for your health care provider to look for changes in the cervix. The health care provider will do a physical exam and may monitor your contractions. If you are not in true labor, the exam should show that your cervix is not dilating and your water has not broken. If there are other health problems associated with your pregnancy, it is completely safe for you to be sent home with false labor. You may continue to have Braxton Hicks contractions until you go into true labor. How  to tell the difference between true labor and false labor True labor  Contractions last 30-70 seconds.  Contractions become very regular.  Discomfort is usually felt in the top of the uterus, and it spreads to the lower abdomen and low back.  Contractions do not go away with walking.  Contractions usually become more intense and increase in frequency.  The cervix dilates and gets thinner. False labor  Contractions are usually shorter and not as strong as true labor contractions.  Contractions are usually irregular.  Contractions are often felt in the front of the lower abdomen and in the groin.  Contractions may go away when you walk around or change positions while lying down.  Contractions get weaker and are shorter-lasting as time goes on.  The cervix usually does not dilate or become thin. Follow these instructions at home:  Take over-the-counter and prescription medicines only as told by your health care provider.  Keep up with your usual exercises and follow other instructions from your health care provider.  Eat and drink lightly if you think you are going into labor.  If Braxton Hicks contractions are making you uncomfortable: ? Change your position from lying down or resting to walking, or change from walking to resting. ? Sit and rest in a tub of warm water. ? Drink enough fluid to keep your urine pale yellow. Dehydration may cause these contractions. ? Do slow and deep breathing several times an hour.  Keep all follow-up prenatal visits as told by your health care provider. This is important. Contact a health care provider if:  You have a fever.  You have continuous pain in your abdomen. Get help right away if:  Your contractions become stronger, more regular, and closer together.  You have fluid leaking or gushing from your vagina.  You pass blood-tinged mucus (bloody show).  You have bleeding from your vagina.  You have low back pain that you  never had before.  You feel your baby's head pushing down and causing pelvic pressure.  Your baby is not moving inside you as much as it used to. Summary  Contractions that occur before labor are called Braxton Hicks contractions, false labor, or practice contractions.  Braxton Hicks contractions are usually shorter, weaker, farther apart, and less regular than true labor contractions. True labor contractions usually become progressively stronger and regular and they become more frequent.  Manage discomfort from [facility] contractions by changing position, resting in a warm bath, drinking plenty of water, or practicing deep breathing. This information is not intended to replace advice given to you by your health care provider. Make sure you discuss any questions you have with your health care provider. Document Released: 10/15/2016 Document Revised: 10/15/2016 Document Reviewed: 10/15/2016 Elsevier Interactive Patient Education  2018 [employer].

## 2018-05-04 NOTE — Progress Notes (Signed)
    Subjective:  Rhonda Avila is a 27 y.o. G1P0000 at 3564w4d being seen today for ongoing prenatal care.  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  Patient reports occasionally has pain bilaterally in groin at night or in the morning..  Contractions: Not present. Vag. Bleeding: None.  Movement: Present. Denies leaking of fluid.   The following portions of the patient's history were reviewed and updated as appropriate: allergies, current medications, past family history, past medical history, past social history, past surgical history and problem list. Problem list updated.  Objective:   Vitals:   05/04/18 0841  BP: 118/67  Pulse: 78  Weight: 217 lb (98.4 kg)    Fetal Status: Fetal Heart Rate (bpm): 148 Fundal Height: 31 cm Movement: Present     General:  Alert, oriented and cooperative. Patient is in no acute distress.  Skin: Skin is warm and dry. No rash noted.   Cardiovascular: Normal heart rate noted  Respiratory: Normal respiratory effort, no problems with respiration noted  Abdomen: Soft, gravid, appropriate for gestational age. Pain/Pressure: Present     Pelvic:  Cervical exam deferred        Extremities: Normal range of motion.  Edema: None  Mental Status: Normal mood and affect. Normal behavior. Normal judgment and thought content.   Urinalysis:      Assessment and Plan:  Pregnancy: G1P0000 at 7564w4d  1. Supervision of normal first pregnancy, antepartum Reviewed contractions with client and reviewed preterm labor symptoms. Encouraged to sign up for childbirth classes - [facility].  - Tdap vaccine greater than or equal to 7yo IM - [facility] AMB BABYSCRIPTS OPT  IN  Preterm labor symptoms and general obstetric precautions including but not limited to vaginal bleeding, contractions, leaking of fluid and fetal movement were reviewed in detail with the patient. Please refer to After Visit Summary for other counseling recommendations.  Return in about 2 weeks (around 05/18/2018).  Nolene BernheimERRI Honor Fairbank, RN, MSN, NP-BC Nurse Practitioner, [facility], [facility] 05/04/2018 9:03 AM

## 2018-05-05 LAB — CBC
Hematocrit: 35.2 % (ref 34.0–46.6)
Hemoglobin: 11.6 g/dL (ref 11.1–15.9)
MCH: 29.2 pg (ref 26.6–33.0)
MCHC: 33 g/dL (ref 31.5–35.7)
MCV: 89 fL (ref 79–97)
PLATELETS: 193 10*3/uL (ref 150–450)
RBC: 3.97 x10E6/uL (ref 3.77–5.28)
RDW: 12.5 % (ref 12.3–15.4)
WBC: 8.6 10*3/uL (ref 3.4–10.8)

## 2018-05-05 LAB — GLUCOSE TOLERANCE, 2 HOURS W/ 1HR
GLUCOSE, 2 HOUR: 81 mg/dL (ref 65–152)
Glucose, 1 hour: 122 mg/dL (ref 65–179)
Glucose, Fasting: 65 mg/dL (ref 65–91)

## 2018-05-05 LAB — RPR: RPR Ser Ql: NONREACTIVE

## 2018-05-05 LAB — HIV ANTIBODY (ROUTINE TESTING W REFLEX): HIV Screen 4th Generation wRfx: NONREACTIVE

## 2018-05-18 ENCOUNTER — Ambulatory Visit (INDEPENDENT_AMBULATORY_CARE_PROVIDER_SITE_OTHER): Payer: Self-pay | Admitting: Nurse Practitioner

## 2018-05-18 VITALS — BP 138/84 | HR 53 | Wt 219.3 lb

## 2018-05-18 DIAGNOSIS — Z3403 Encounter for supervision of normal first pregnancy, third trimester: Secondary | ICD-10-CM

## 2018-05-18 DIAGNOSIS — Z3400 Encounter for supervision of normal first pregnancy, unspecified trimester: Secondary | ICD-10-CM

## 2018-05-18 DIAGNOSIS — O98819 Other maternal infectious and parasitic diseases complicating pregnancy, unspecified trimester: Secondary | ICD-10-CM

## 2018-05-18 DIAGNOSIS — B951 Streptococcus, group B, as the cause of diseases classified elsewhere: Secondary | ICD-10-CM

## 2018-05-18 NOTE — Progress Notes (Signed)
    Subjective:  Rhonda Avila is a 27 y.o. G1P0000 at 4324w4d being seen today for ongoing prenatal care.  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  Patient reports no complaints.  Contractions: Not present. Vag. Bleeding: None.  Movement: Present. Denies leaking of fluid.   The following portions of the patient's history were reviewed and updated as appropriate: allergies, current medications, past family history, past medical history, past social history, past surgical history and problem list. Problem list updated.  Objective:   Vitals:   05/18/18 1509  BP: 138/84  Pulse: (!) 53  Weight: 219 lb 4.8 oz (99.5 kg)    Fetal Status: Fetal Heart Rate (bpm): 145 Fundal Height: 32 cm Movement: Present     General:  Alert, oriented and cooperative. Patient is in no acute distress.  Skin: Skin is warm and dry. No rash noted.   Cardiovascular: Normal heart rate noted  Respiratory: Normal respiratory effort, no problems with respiration noted  Abdomen: Soft, gravid, appropriate for gestational age. Pain/Pressure: Present     Pelvic:  Cervical exam deferred        Extremities: Normal range of motion.  Edema: None  Mental Status: Normal mood and affect. Normal behavior. Normal judgment and thought content.   Urinalysis:      Assessment and Plan:  Pregnancy: G1P0000 at 124w4d  1. Supervision of normal first pregnancy, antepartum BP is creeping higher but no headaches, no blurred vision, No RUQ pain Investigating signing up for childbirth classes [facility] - will call this week  2. Group B streptococcal infection in  pregnancy Client was unaware she was GBS positive - info given on AVS  Preterm labor symptoms and general obstetric precautions including but not limited to vaginal bleeding, contractions, leaking of fluid and fetal movement were reviewed in detail with the patient. Please refer to After Visit Summary for other counseling recommendations.  Return in about 2 weeks (around 06/01/2018).  Nolene BernheimERRI BURLESON, RN, MSN, NP-BC Nurse Practitioner, [facility], [facility] 05/18/2018 3:39 PM

## 2018-05-18 NOTE — Patient Instructions (Addendum)
Group B Streptococcus Infection During Pregnancy Group B Streptococcus (GBS) is a type of bacteria (Streptococcus agalactiae) that is often found in healthy people, commonly in the rectum, vagina, and intestines. In people who are healthy and not pregnant, the bacteria rarely cause serious illness or complications. However, women who test positive for GBS during pregnancy can pass the bacteria to their baby during childbirth, which can cause serious infection in the baby after birth. Women with GBS may also have infections during their pregnancy or immediately after childbirth, such as such as urinary tract infections (UTIs) or infections of the uterus (uterine infections). Having GBS also increases a woman's risk of complications during pregnancy, such as early (preterm) labor or delivery, miscarriage, or stillbirth. Routine testing (screening) for GBS is recommended for all pregnant women. What increases the risk? You may have a higher risk for GBS infection during pregnancy if you had one during a past pregnancy. What are the signs or symptoms? In most cases, GBS infection does not cause symptoms in pregnant women. Signs and symptoms of a possible GBS-related infection may include:  Labor starting before the 37th week of pregnancy.  A UTI or bladder infection, which may cause: ? Fever. ? Pain or burning during urination. ? Frequent urination.  Fever during labor, along with: ? Bad-smelling discharge. ? Uterine tenderness. ? Rapid heartbeat in the mother, baby, or both.  Rare but serious symptoms of a possible GBS-related infection in women include:  Blood infection (septicemia). This may cause fever, chills, or confusion.  Lung infection (pneumonia). This may cause fever, chills, cough, rapid breathing, difficulty breathing, or chest pain.  Bone, joint, skin, or soft tissue infection.  How is this diagnosed? You may be screened for GBS between week 35 and week 37 of your pregnancy. If  you have symptoms of preterm labor, you may be screened earlier. This condition is diagnosed based on lab test results from:  A swab of fluid from the vagina and rectum.  A urine sample.  How is this treated? This condition is treated with antibiotic medicine. When you go into labor, or as soon as your water breaks (your membranes rupture), you will be given antibiotics through an IV tube. Antibiotics will continue until after you give birth. If you are having a cesarean delivery, you do not need antibiotics unless your membranes have already ruptured. Follow these instructions at home:  Take over-the-counter and prescription medicines only as told by your health care provider.  Take your antibiotic medicine as told by your health care provider. Do not stop taking the antibiotic even if you start to feel better.  Keep all pre-birth (prenatal) visits and follow-up visits as told by your health care provider. This is important. Contact a health care provider if:  You have pain or burning when you urinate.  You have to urinate frequently.  You have a fever or chills.  You develop a bad-smelling vaginal discharge. Get help right away if:  Your membranes rupture.  You go into labor.  You have severe pain in your abdomen.  You have difficulty breathing.  You have chest pain. This information is not intended to replace advice given to you by your health care provider. Make sure you discuss any questions you have with your health care provider. Document Released: 09/08/2007 Document Revised: 12/27/2015 Document Reviewed: 12/26/2015 Elsevier Interactive Patient Education  2018 [employer] Inc.   Braxton Hicks Contractions Contractions of the uterus can occur throughout pregnancy, but they are not always a   sign that you are in labor. You may have practice contractions called Braxton Hicks contractions. These false labor contractions are sometimes confused with true labor. What are  Braxton Hicks contractions? Braxton Hicks contractions are tightening movements that occur in the muscles of the uterus before labor. Unlike true labor contractions, these contractions do not result in opening (dilation) and thinning of the cervix. Toward the end of pregnancy (32-34 weeks), Braxton Hicks contractions can happen more often and may become stronger. These contractions are sometimes difficult to tell apart from true labor because they can be very uncomfortable. You should not feel embarrassed if you go to the hospital with false labor. Sometimes, the only way to tell if you are in true labor is for your health care provider to look for changes in the cervix. The health care provider will do a physical exam and may monitor your contractions. If you are not in true labor, the exam should show that your cervix is not dilating and your water has not broken. If there are other health problems associated with your pregnancy, it is completely safe for you to be sent home with false labor. You may continue to have Braxton Hicks contractions until you go into true labor. How to tell the difference between true labor and false labor True labor  Contractions last 30-70 seconds.  Contractions become very regular.  Discomfort is usually felt in the top of the uterus, and it spreads to the lower abdomen and low back.  Contractions do not go away with walking.  Contractions usually become more intense and increase in frequency.  The cervix dilates and gets thinner. False labor  Contractions are usually shorter and not as strong as true labor contractions.  Contractions are usually irregular.  Contractions are often felt in the front of the lower abdomen and in the groin.  Contractions may go away when you walk around or change positions while lying down.  Contractions get weaker and are shorter-lasting as time goes on.  The cervix usually does not dilate or become thin. Follow these  instructions at home:  Take over-the-counter and prescription medicines only as told by your health care provider.  Keep up with your usual exercises and follow other instructions from your health care provider.  Eat and drink lightly if you think you are going into labor.  If Braxton Hicks contractions are making you uncomfortable: ? Change your position from lying down or resting to walking, or change from walking to resting. ? Sit and rest in a tub of warm water. ? Drink enough fluid to keep your urine pale yellow. Dehydration may cause these contractions. ? Do slow and deep breathing several times an hour.  Keep all follow-up prenatal visits as told by your health care provider. This is important. Contact a health care provider if:  You have a fever.  You have continuous pain in your abdomen. Get help right away if:  Your contractions become stronger, more regular, and closer together.  You have fluid leaking or gushing from your vagina.  You pass blood-tinged mucus (bloody show).  You have bleeding from your vagina.  You have low back pain that you never had before.  You feel your baby's head pushing down and causing pelvic pressure.  Your baby is not moving inside you as much as it used to. Summary  Contractions that occur before labor are called Braxton Hicks contractions, false labor, or practice contractions.  Braxton Hicks contractions are usually shorter, weaker, farther   apart, and less regular than true labor contractions. True labor contractions usually become progressively stronger and regular and they become more frequent.  Manage discomfort from Braxton Hicks contractions by changing position, resting in a warm bath, drinking plenty of water, or practicing deep breathing. This information is not intended to replace advice given to you by your health care provider. Make sure you discuss any questions you have with your health care provider. Document Released:  10/15/2016 Document Revised: 10/15/2016 Document Reviewed: 10/15/2016 Elsevier Interactive Patient Education  2018 Elsevier Inc.  

## 2018-06-01 ENCOUNTER — Ambulatory Visit (INDEPENDENT_AMBULATORY_CARE_PROVIDER_SITE_OTHER): Payer: Self-pay | Admitting: Nurse Practitioner

## 2018-06-01 VITALS — BP 144/90 | HR 69 | Wt 225.4 lb

## 2018-06-01 DIAGNOSIS — Z3400 Encounter for supervision of normal first pregnancy, unspecified trimester: Secondary | ICD-10-CM

## 2018-06-01 DIAGNOSIS — R030 Elevated blood-pressure reading, without diagnosis of hypertension: Secondary | ICD-10-CM

## 2018-06-01 DIAGNOSIS — Z3403 Encounter for supervision of normal first pregnancy, third trimester: Secondary | ICD-10-CM

## 2018-06-01 DIAGNOSIS — J069 Acute upper respiratory infection, unspecified: Secondary | ICD-10-CM

## 2018-06-01 NOTE — Progress Notes (Addendum)
    Subjective:  Rhonda Avila is a 27 y.o. G1P0000 at 6538w4d being seen today for ongoing prenatal care.  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  Patient reports sinus congestion and sore throat.  Contractions: Not present. Vag. Bleeding: None.  Movement: Present. Denies leaking of fluid.   The following portions of the patient's history were reviewed and updated as appropriate: allergies, current medications, past family history, past medical history, past social history, past surgical history and problem list. Problem list updated.  Objective:   Vitals:   06/01/18 1523 06/01/18 1550  BP: (!) 148/89 (!) 144/90  Pulse: 69   Weight: 225 lb 6.4 oz (102.2 kg)     Fetal Status: Fetal Heart Rate (bpm): 151 Fundal Height: 33 cm Movement: Present     General:  Alert, oriented and cooperative. Patient is in no acute distress.  Skin: Skin is warm and dry. No rash noted.   Cardiovascular: Normal heart rate noted  Respiratory: Normal respiratory effort, no problems with respiration noted  Abdomen: Soft, gravid, appropriate for gestational age. Pain/Pressure: Present     Pelvic:  Cervical exam deferred        Extremities: Normal range of motion.  Edema: Mild pitting, slight indentation  Mental Status: Normal mood and affect. Normal behavior. Normal judgment and thought content.   Urinalysis:      Assessment and Plan:  Pregnancy: G1P0000 at 3338w4d  1. Supervision of normal first pregnancy, antepartum No symptoms related to BP.  No headaches, blurred vision or RUQ pain.  Baby has been moving well.  2. Elevated BP without diagnosis of hypertension Labs  done today Will return on Friday morning for repeat BP check and check lab results. Discussed with Dr. Shawnie PonsPratt and if BP is up on Friday, will need to be seen weekly and have NST done on Friday with an ultrasound for growth. Reviewed signs of worsening BP with client and instructed her to return to [facility] if having worrisome symptoms.  3. Upper respiratory tract infection, unspecified type Given list of meds - take Flonase and Zyrtec Drink at least 8 8-oz glasses of water every day. Take Tylenol 325 mg 2 tablets by mouth every 4 hours if needed for pain.  Preterm labor symptoms and general obstetric precautions including but not limited to vaginal bleeding, contractions, leaking of fluid and fetal movement were reviewed in detail with the patient. Please refer to After Visit Summary for other counseling recommendations.  Return in about 2 days (around 06/03/2018) for BP check - and check for PIH symptoms.  Nolene BernheimERRI Sakina Briones, RN, MSN, NP-BC Nurse Practitioner, [facility], [facility] 06/01/2018 3:57 PM

## 2018-06-01 NOTE — Patient Instructions (Addendum)
Return to [facility] at any time if you have headaches, blurred vision, baby is not moving well, right upper quadrant pain or lots of swelling. Return here on Friday morning for a blood pressure check. Can use the over the counter meds below.   Safe Medications in Pregnancy   Acne: Benzoyl Peroxide Salicylic Acid  Backache/Headache: Tylenol: 2 regular strength every 4 hours OR              2 Extra strength every 6 hours  Colds/Coughs/Allergies: Benadryl (alcohol free) 25 mg every 6 hours as needed Breath right strips Claritin Cepacol throat lozenges Chloraseptic throat spray Cold-Eeze- up to three times per day Cough drops, alcohol free **Flonase  Guaifenesin Mucinex Robitussin DM (plain only, alcohol free) Saline nasal spray/drops Sudafed (pseudoephedrine) & Actifed ** use only after [redacted] weeks gestation and if you do not have high blood pressure Tylenol Vicks Vaporub Zinc lozenges **Zyrtec   Constipation: Colace Ducolax suppositories Fleet enema Glycerin suppositories Metamucil Milk of magnesia Miralax Senokot Smooth move tea  Diarrhea: Kaopectate Imodium A-D  *NO pepto Bismol  Hemorrhoids: Anusol Anusol HC Preparation H Tucks  Indigestion: Tums Maalox Mylanta Zantac  Pepcid  Insomnia: Benadryl (alcohol free) 25mg  every 6 hours as needed Tylenol PM Unisom, no Gelcaps  Leg Cramps: Tums MagGel  Nausea/Vomiting:  Bonine Dramamine Emetrol Ginger extract Sea bands Meclizine  Nausea medication to take during pregnancy:  Unisom (doxylamine succinate 25 mg tablets) Take one tablet daily at bedtime. If symptoms are not adequately controlled, the dose can be increased to a maximum recommended dose of two tablets daily (1/2 tablet in the morning, 1/2 tablet mid-afternoon and one at bedtime). Vitamin B6 100mg  tablets. Take one tablet twice a day (up to 200 mg per day).  Skin Rashes: Aveeno products Benadryl cream or 25mg  every 6  hours as needed Calamine Lotion 1% cortisone cream  Yeast infection: Gyne-lotrimin 7 Monistat 7   **If taking multiple medications, please check labels to avoid duplicating the same active ingredients **take medication as directed on the label ** Do not exceed 4000 mg of tylenol in 24 hours **Do not take medications that contain aspirin or ibuprofen

## 2018-06-02 LAB — PROTEIN / CREATININE RATIO, URINE
Creatinine, Urine: 80.5 mg/dL
Protein, Ur: 63.9 mg/dL
Protein/Creat Ratio: 794 mg/g creat — ABNORMAL HIGH (ref 0–200)

## 2018-06-02 LAB — COMPREHENSIVE METABOLIC PANEL
ALT: 17 IU/L (ref 0–32)
AST: 18 IU/L (ref 0–40)
Albumin/Globulin Ratio: 1.4 (ref 1.2–2.2)
Albumin: 3.3 g/dL — ABNORMAL LOW (ref 3.5–5.5)
Alkaline Phosphatase: 114 IU/L (ref 39–117)
BUN/Creatinine Ratio: 9 (ref 9–23)
BUN: 5 mg/dL — ABNORMAL LOW (ref 6–20)
Bilirubin Total: <0.2 mg/dL (ref 0.0–1.2)
CO2: 19 mmol/L — ABNORMAL LOW (ref 20–29)
Calcium: 9 mg/dL (ref 8.7–10.2)
Chloride: 106 mmol/L (ref 96–106)
Creatinine, Ser: 0.57 mg/dL (ref 0.57–1.00)
GFR calc Af Amer: 147 mL/min/{1.73_m2} (ref >59)
GFR calc non Af Amer: 127 mL/min/{1.73_m2} (ref >59)
Globulin, Total: 2.3 g/dL (ref 1.5–4.5)
Glucose: 82 mg/dL (ref 65–99)
POTASSIUM: 4.1 mmol/L (ref 3.5–5.2)
Sodium: 140 mmol/L (ref 134–144)
Total Protein: 5.6 g/dL — ABNORMAL LOW (ref 6.0–8.5)

## 2018-06-02 LAB — CBC
HEMATOCRIT: 33.7 % — AB (ref 34.0–46.6)
HEMOGLOBIN: 11.8 g/dL (ref 11.1–15.9)
MCH: 31.1 pg (ref 26.6–33.0)
MCHC: 35 g/dL (ref 31.5–35.7)
MCV: 89 fL (ref 79–97)
Platelets: 145 10*3/uL — ABNORMAL LOW (ref 150–450)
RBC: 3.79 x10E6/uL (ref 3.77–5.28)
RDW: 13.1 % (ref 12.3–15.4)
WBC: 7.6 10*3/uL (ref 3.4–10.8)

## 2018-06-03 ENCOUNTER — Encounter: Payer: Self-pay | Admitting: Nurse Practitioner

## 2018-06-03 ENCOUNTER — Ambulatory Visit (INDEPENDENT_AMBULATORY_CARE_PROVIDER_SITE_OTHER): Payer: Self-pay

## 2018-06-03 VITALS — BP 148/102

## 2018-06-03 DIAGNOSIS — Z0130 Encounter for examination of blood pressure without abnormal findings: Secondary | ICD-10-CM

## 2018-06-03 NOTE — Progress Notes (Signed)
Chart reviewed - agree with RN documentation.   

## 2018-06-03 NOTE — Progress Notes (Addendum)
Pt here today for BP check.  Pt denies any headaches or blurred vision but does present with +1 pitting edema lower extremities.  Notified Dr. Adrian BlackwaterStinson who evaluated pt's labs that were drawn on 06/01/18.  Provider recommendation is to have pt come back in week for MD visit, she does need NST because she is 31w 6d today, and that pt is now dx gestational HTN.  Notified pt of provider's recommendation and to go to [facility] if she starts to have headaches that aren't relieved by Tylenol, blurred vision, and increased swelling.  Pt stated understanding with no further questions.

## 2018-06-03 NOTE — Patient Instructions (Signed)
Hypertension During Pregnancy ° °Hypertension is also called high blood pressure. High blood pressure means that the force of your blood moving in your body is too strong. When you are pregnant, this condition should be watched carefully. It can cause problems for you and your baby. °Follow these instructions at home: °Eating and drinking ° °· Drink enough fluid to keep your pee (urine) pale yellow. °· Avoid caffeine. °Lifestyle °· Do not use any products that contain nicotine or tobacco, such as cigarettes and e-cigarettes. If you need help quitting, ask your doctor. °· Do not use alcohol or drugs. °· Avoid stress. °· Rest and get plenty of sleep. °General instructions °· Take over-the-counter and prescription medicines only as told by your doctor. °· While lying down, lie on your left side. This keeps pressure off your major blood vessels. °· While sitting or lying down, raise (elevate) your feet. Try putting some pillows under your lower legs. °· Exercise regularly. Ask your doctor what kinds of exercise are best for you. °· Keep all prenatal and follow-up visits as told by your doctor. This is important. °Contact a doctor if: °· You have symptoms that your doctor told you to watch for, such as: °? Throwing up (vomiting). °? Feeling sick to your stomach (nausea). °? Headache. °Get help right away if you have: °· Very bad belly pain that does not get better with treatment. °· A very bad headache that does not get better. °· Throwing up that does not get better with treatment. °· Sudden, fast weight gain. °· Sudden swelling in your hands, ankles, or face. °· Bleeding from your vagina. °· Blood in your pee. °· Fewer movements from your baby than usual. °· Blurry vision. °· Double vision. °· Muscle twitching. °· Sudden muscle tightening (spasms). °· Trouble breathing. °· Blue fingernails or lips. °Summary °· Hypertension is also called high blood pressure. High blood pressure means that the force of your blood moving  in your body is too strong. °· When you are pregnant, this condition should be watched carefully. It can cause problems for you and your baby. °· Get help right away if you have symptoms that your doctor told you to watch for. °This information is not intended to replace advice given to you by your health care provider. Make sure you discuss any questions you have with your health care provider. °Document Released: 07/04/2010 Document Revised: 05/18/2017 Document Reviewed: 02/11/2016 °Elsevier Interactive Patient Education © 2019 Elsevier Inc. ° °

## 2018-06-09 ENCOUNTER — Ambulatory Visit: Payer: Self-pay

## 2018-06-09 ENCOUNTER — Ambulatory Visit (INDEPENDENT_AMBULATORY_CARE_PROVIDER_SITE_OTHER): Payer: Self-pay | Admitting: Obstetrics and Gynecology

## 2018-06-09 ENCOUNTER — Encounter: Payer: Self-pay | Admitting: Obstetrics and Gynecology

## 2018-06-09 VITALS — BP 144/94 | HR 68 | Wt 223.5 lb

## 2018-06-09 DIAGNOSIS — O0990 Supervision of high risk pregnancy, unspecified, unspecified trimester: Secondary | ICD-10-CM

## 2018-06-09 DIAGNOSIS — O1403 Mild to moderate pre-eclampsia, third trimester: Secondary | ICD-10-CM

## 2018-06-09 DIAGNOSIS — R030 Elevated blood-pressure reading, without diagnosis of hypertension: Secondary | ICD-10-CM

## 2018-06-09 DIAGNOSIS — O98813 Other maternal infectious and parasitic diseases complicating pregnancy, third trimester: Secondary | ICD-10-CM

## 2018-06-09 DIAGNOSIS — B951 Streptococcus, group B, as the cause of diseases classified elsewhere: Secondary | ICD-10-CM

## 2018-06-09 DIAGNOSIS — A6004 Herpesviral vulvovaginitis: Secondary | ICD-10-CM

## 2018-06-09 DIAGNOSIS — O98819 Other maternal infectious and parasitic diseases complicating pregnancy, unspecified trimester: Secondary | ICD-10-CM

## 2018-06-09 DIAGNOSIS — O0993 Supervision of high risk pregnancy, unspecified, third trimester: Secondary | ICD-10-CM

## 2018-06-09 LAB — COMPREHENSIVE METABOLIC PANEL
ALT: 11 IU/L (ref 0–32)
AST: 12 IU/L (ref 0–40)
Albumin/Globulin Ratio: 1.3 (ref 1.2–2.2)
Albumin: 3.1 g/dL — ABNORMAL LOW (ref 3.5–5.5)
Alkaline Phosphatase: 127 IU/L — ABNORMAL HIGH (ref 39–117)
BUN/Creatinine Ratio: 12 (ref 9–23)
BUN: 7 mg/dL (ref 6–20)
Bilirubin Total: <0.2 mg/dL (ref 0.0–1.2)
CO2: 20 mmol/L (ref 20–29)
Calcium: 8.8 mg/dL (ref 8.7–10.2)
Chloride: 103 mmol/L (ref 96–106)
Creatinine, Ser: 0.59 mg/dL (ref 0.57–1.00)
GFR calc Af Amer: 145 mL/min/{1.73_m2} (ref >59)
GFR calc non Af Amer: 126 mL/min/{1.73_m2} (ref >59)
Globulin, Total: 2.3 g/dL (ref 1.5–4.5)
Glucose: 92 mg/dL (ref 65–99)
Potassium: 3.8 mmol/L (ref 3.5–5.2)
Sodium: 136 mmol/L (ref 134–144)
TOTAL PROTEIN: 5.4 g/dL — AB (ref 6.0–8.5)

## 2018-06-09 LAB — CBC
Hematocrit: 35.4 % (ref 34.0–46.6)
Hemoglobin: 12.5 g/dL (ref 11.1–15.9)
MCH: 30.4 pg (ref 26.6–33.0)
MCHC: 35.3 g/dL (ref 31.5–35.7)
MCV: 86 fL (ref 79–97)
PLATELETS: 153 10*3/uL (ref 150–450)
RBC: 4.11 x10E6/uL (ref 3.77–5.28)
RDW: 13.1 % (ref 12.3–15.4)
WBC: 9 10*3/uL (ref 3.4–10.8)

## 2018-06-09 MED ORDER — VALACYCLOVIR HCL 1 G PO TABS: 1000.0000 mg | ORAL_TABLET | Freq: Every day | ORAL | 1 refills | Status: DC

## 2018-06-09 NOTE — Patient Instructions (Signed)
Compression stockings or also called TED hoses   Preeclampsia  Preeclampsia is a serious condition that may develop during pregnancy. It is also called toxemia of pregnancy. This condition causes high blood pressure along with other symptoms, such as swelling and headaches. These symptoms may develop as the condition gets worse. Preeclampsia may occur at 20 weeks of pregnancy or later. Diagnosing and treating preeclampsia early is very important. If not treated early, it can cause serious problems for you and your baby. One problem it can lead to is eclampsia. Eclampsia is a condition that causes muscle jerking or shaking (convulsions or seizures) and other serious problems for the mother. During pregnancy, delivering your baby may be the best treatment for preeclampsia or eclampsia. For most women, preeclampsia and eclampsia symptoms go away after giving birth. In rare cases, a woman may develop preeclampsia after giving birth (postpartum preeclampsia). This usually occurs within 48 hours after childbirth but may occur up to 6 weeks after giving birth. What are the causes? The cause of preeclampsia is not known. What increases the risk? The following risk factors make you more likely to develop preeclampsia:  Being pregnant for the first time.  Having had preeclampsia during a past pregnancy.  Having a family history of preeclampsia.  Having high blood pressure.  Being pregnant with more than one baby.  Being 6335 or older.  Being African-American.  Having kidney disease or diabetes.  Having medical conditions such as lupus or blood diseases.  Being very overweight (obese). What are the signs or symptoms? The earliest signs of preeclampsia are:  High blood pressure.  Increased protein in your urine. Your health care provider will check for this at every visit before you give birth (prenatal visit). Other symptoms that may develop as the condition gets worse include:  Severe  headaches.  Sudden weight gain.  Swelling of the hands, face, legs, and feet.  Nausea and vomiting.  Vision problems, such as blurred or double vision.  Numbness in the face, arms, legs, and feet.  Urinating less than usual.  Dizziness.  Slurred speech.  Abdominal pain, especially upper abdominal pain.  Convulsions or seizures. How is this diagnosed? There are no screening tests for preeclampsia. Your health care provider will ask you about symptoms and check for signs of preeclampsia during your prenatal visits. You may also have tests that include:  Urine tests.  Blood tests.  Checking your blood pressure.  Monitoring your baby's heart rate.  Ultrasound. How is this treated? You and your health care provider will determine the treatment approach that is best for you. Treatment may include:  Having more frequent prenatal exams to check for signs of preeclampsia, if you have an increased risk for preeclampsia.  Medicine to lower your blood pressure.  Staying in the hospital, if your condition is severe. There, treatment will focus on controlling your blood pressure and the amount of fluids in your body (fluid retention).  Taking medicine (magnesium sulfate) to prevent seizures. This may be given as an injection or through an IV.  Taking a low-dose aspirin during your pregnancy.  Delivering your baby early, if your condition gets worse. You may have your labor started with medicine (induced), or you may have a cesarean delivery. Follow these instructions at home: Eating and drinking   Drink enough fluid to keep your urine pale yellow.  Avoid caffeine. Lifestyle  Do not use any products that contain nicotine or tobacco, such as cigarettes and e-cigarettes. If you need help  quitting, ask your health care provider.  Do not use alcohol or drugs.  Avoid stress as much as possible. Rest and get plenty of sleep. General instructions  Take over-the-counter and  prescription medicines only as told by your health care provider.  When lying down, lie on your left side. This keeps pressure off your major blood vessels.  When sitting or lying down, raise (elevate) your feet. Try putting some pillows underneath your lower legs.  Exercise regularly. Ask your health care provider what kinds of exercise are best for you.  Keep all follow-up and prenatal visits as told by your health care provider. This is important. How is this prevented? There is no known way of preventing preeclampsia or eclampsia from developing. However, to lower your risk of complications and detect problems early:  Get regular prenatal care. Your health care provider may be able to diagnose and treat the condition early.  Maintain a healthy weight. Ask your health care provider for help managing weight gain during pregnancy.  Work with your health care provider to manage any long-term (chronic) health conditions you have, such as diabetes or kidney problems.  You may have tests of your blood pressure and kidney function after giving birth.  Your health care provider may have you take low-dose aspirin during your next pregnancy. Contact a health care provider if:  You have symptoms that your health care provider told you may require more treatment or monitoring, such as: ? Headaches. ? Nausea or vomiting. ? Abdominal pain. ? Dizziness. ? Light-headedness. Get help right away if:  You have severe: ? Abdominal pain. ? Headaches that do not get better. ? Dizziness. ? Vision problems. ? Confusion. ? Nausea or vomiting.  You have any of the following: ? A seizure. ? Sudden, rapid weight gain. ? Sudden swelling in your hands, ankles, or face. ? Trouble moving any part of your body. ? Numbness in any part of your body. ? Trouble speaking. ? Abnormal bleeding.  You faint. Summary  Preeclampsia is a serious condition that may develop during pregnancy. It is also called  toxemia of pregnancy.  This condition causes high blood pressure along with other symptoms, such as swelling and headaches.  Diagnosing and treating preeclampsia early is very important. If not treated early, it can cause serious problems for you and your baby.  Get help right away if you have symptoms that your health care provider told you to watch for. This information is not intended to replace advice given to you by your health care provider. Make sure you discuss any questions you have with your health care provider. Document Released: 05/29/2000 Document Revised: 05/18/2017 Document Reviewed: 01/06/2016 Elsevier Interactive Patient Education  2019 [employer]

## 2018-06-09 NOTE — Progress Notes (Signed)
Prenatal Visit Note Date: 06/09/2018 [facility]  Subjective:  Rhonda Avila is a 27 y.o. G1P0000 at 1264w5d being seen today for ongoing prenatal care.  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  Patient reports no complaints.   Contractions: Irritability. Vag. Bleeding: None.  Movement: Present. Denies leaking of fluid.   The following portions of the patient's history were reviewed and updated as appropriate: allergies, current medications, past family history, past medical history, past social history, past surgical history and problem list. Problem list updated.  Objective:   Vitals:   06/09/18 1109 06/09/18 1113  BP: (!) 156/94 (!) 144/94  Pulse: 67 68  Weight: 223 lb 8 oz (101.4 kg)     Fetal Status: Fetal Heart Rate (bpm): 150 Fundal Height: 32 cm Movement: Present  Presentation: Vertex  General:  Alert, oriented and cooperative. Patient is in no acute distress.  Skin: Skin is warm and dry. No rash noted.   Cardiovascular: Normal heart rate noted  Respiratory: Normal respiratory effort, no problems with respiration noted  Abdomen: Soft, gravid, appropriate for gestational age. Pain/Pressure: Present     Pelvic:  Cervical exam deferred        Extremities: Normal range of motion.  Edema: Mild pitting, slight indentation  Mental Status: Normal mood and affect. Normal behavior. Normal judgment and thought content.   Urinalysis:      Assessment and Plan:  Pregnancy: G1P0000 at 4964w5d  1. Mild pre-eclampsia in third trimester D/w her implications with dx. Also d/w her re: testing and ROS to be on  the look out for that would mean she should come in to be checked out. Add on bpp 10/10 today and will order growth u/s. Plan of care: qwk bpp/nst, qwk ROB visits, qwk labs, qmonth growth, 37wk delivery - US MFM OB FOLLOW UP; Future - US MFM FETAL BPP WO NON STRESS; Future - US FETAL BPP W/NONSTRESS; Future  2. Group B streptococcal infection in pregnancy toc nv  3. Supervision of high risk pregnancy, antepartum  4. 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 Please refer to After Visit Summary for other counseling recommendations.  Return in about 1 week (around 06/16/2018) for nst/hrob/bpp/labs.    BingPickens, Sashay Felling, MD

## 2018-06-09 NOTE — Progress Notes (Signed)

## 2018-06-10 LAB — PROTEIN / CREATININE RATIO, URINE
Creatinine, Urine: 87.7 mg/dL
Protein, Ur: 180 mg/dL
Protein/Creat Ratio: 2052 mg/g creat — ABNORMAL HIGH (ref 0–200)

## 2018-06-15 ENCOUNTER — Other Ambulatory Visit: Payer: Self-pay

## 2018-06-15 ENCOUNTER — Inpatient Hospital Stay (HOSPITAL_COMMUNITY)
Admission: AD | Admit: 2018-06-15 | Discharge: 2018-06-15 | Disposition: A | Discharge status: Home / Self Care | Payer: Self-pay | Attending: Obstetrics and Gynecology | Admitting: Obstetrics and Gynecology

## 2018-06-15 ENCOUNTER — Encounter (HOSPITAL_COMMUNITY): Payer: Self-pay

## 2018-06-15 DIAGNOSIS — O1493 Unspecified pre-eclampsia, third trimester: Secondary | ICD-10-CM | POA: Insufficient documentation

## 2018-06-15 DIAGNOSIS — Z3A33 33 weeks gestation of pregnancy: Secondary | ICD-10-CM | POA: Insufficient documentation

## 2018-06-15 HISTORY — DX: Gestational (pregnancy-induced) hypertension without significant proteinuria, unspecified trimester: O13.9

## 2018-06-15 HISTORY — DX: Herpesviral infection, unspecified: B00.9

## 2018-06-15 LAB — COMPREHENSIVE METABOLIC PANEL
ALT: 15 U/L (ref 0–44)
AST: 20 U/L (ref 15–41)
Albumin: 2.8 g/dL — ABNORMAL LOW (ref 3.5–5.0)
Alkaline Phosphatase: 123 U/L (ref 38–126)
Anion gap: 7 (ref 5–15)
BUN: 10 mg/dL (ref 6–20)
CO2: 21 mmol/L — ABNORMAL LOW (ref 22–32)
Calcium: 8.8 mg/dL — ABNORMAL LOW (ref 8.9–10.3)
Chloride: 106 mmol/L (ref 98–111)
Creatinine, Ser: 0.64 mg/dL (ref 0.44–1.00)
GFR calc Af Amer: >60 mL/min (ref >60)
GFR calc non Af Amer: >60 mL/min (ref >60)
Glucose, Bld: 72 mg/dL (ref 70–99)
Potassium: 3.8 mmol/L (ref 3.5–5.1)
SODIUM: 134 mmol/L — AB (ref 135–145)
Total Bilirubin: 0.4 mg/dL (ref 0.3–1.2)
Total Protein: 6.6 g/dL (ref 6.5–8.1)

## 2018-06-15 LAB — CBC
HCT: 37.7 % (ref 36.0–46.0)
Hemoglobin: 12.6 g/dL (ref 12.0–15.0)
MCH: 30.4 pg (ref 26.0–34.0)
MCHC: 33.4 g/dL (ref 30.0–36.0)
MCV: 90.8 fL (ref 80.0–100.0)
Platelets: 161 10*3/uL (ref 150–400)
RBC: 4.15 MIL/uL (ref 3.87–5.11)
RDW: 13.5 % (ref 11.5–15.5)
WBC: 9.2 10*3/uL (ref 4.0–10.5)
nRBC: 0 % (ref 0.0–0.2)

## 2018-06-15 LAB — URINALYSIS, ROUTINE W REFLEX MICROSCOPIC
Bilirubin Urine: NEGATIVE
GLUCOSE, UA: NEGATIVE mg/dL
Hgb urine dipstick: NEGATIVE
Ketones, ur: NEGATIVE mg/dL
Leukocytes, UA: NEGATIVE
Nitrite: NEGATIVE
Protein, ur: 100 mg/dL — AB
Specific Gravity, Urine: 1.004 — ABNORMAL LOW (ref 1.005–1.030)
pH: 7 (ref 5.0–8.0)

## 2018-06-15 LAB — INFLUENZA PANEL BY PCR (TYPE A & B)
INFLAPCR: NEGATIVE
INFLBPCR: NEGATIVE

## 2018-06-15 LAB — PROTEIN / CREATININE RATIO, URINE
Creatinine, Urine: 19 mg/dL
Protein Creatinine Ratio: 6.11 mg/mg{Cre} — ABNORMAL HIGH (ref 0.00–0.15)
Total Protein, Urine: 116 mg/dL

## 2018-06-15 MED ORDER — HYDRALAZINE HCL 20 MG/ML IJ SOLN: 10.0000 mg | INTRAMUSCULAR | Status: DC | PRN

## 2018-06-15 MED ORDER — BETAMETHASONE SOD PHOS & ACET 6 (3-3) MG/ML IJ SUSP
12.0000 mg | Freq: Once | INTRAMUSCULAR | Status: AC
  Administered 2018-06-15: 12 mg via INTRAMUSCULAR
  Filled 2018-06-15: qty 2

## 2018-06-15 MED ORDER — LABETALOL HCL 5 MG/ML IV SOLN: 80.0000 mg | INTRAVENOUS | Status: DC | PRN

## 2018-06-15 MED ORDER — LABETALOL HCL 5 MG/ML IV SOLN: 40.0000 mg | INTRAVENOUS | Status: DC | PRN

## 2018-06-15 MED ORDER — LABETALOL HCL 5 MG/ML IV SOLN
20.0000 mg | INTRAVENOUS | Status: DC | PRN
  Administered 2018-06-15: 20 mg via INTRAVENOUS
  Filled 2018-06-15: qty 4

## 2018-06-15 NOTE — L&D Delivery Note (Addendum)
Delivery Note Patient is a 28 y.o. now G1P0000 s/p NSVD at [redacted]w[redacted]d, who was admitted for IOL for preeclampsia.  She began her labor with FB insertion and was augmented with pitocin. Baby experienced decels and RN called [place] to room for AROM and IUPC placement. AROM completed with return of pink-tinged fluid. Within of placing IUPC, mom progressed to complete with the urge to push. Gushes of blood noted with each push, cytotec and TXA verbal orders given to be at bedside. She pushed for to deliver. NICU at bedside. At 12:24 PM a viable female was delivered via Vaginal, Spontaneous (Presentation: cephalic; OA).  APGAR: 8, 8; weight 3 lb 9.9 oz (1640 g).  Cord clamping delayed for , then clamped by SNM and cut by FOB. Baby handed to NICU team. Placenta and 3-vessel cord intact and spontaneous. Cord pH: N/a. Fundus firm, but we continued to see repeated gushes, mom would pass clots after fundal massage. 800mg  cytotec given rectally. No laceration repair needed. Mom and baby stable prior to transfer to NICU and high-risk unit. She plans of breastfeeding and is unsure of postpartum birth control plans.   Anesthesia:  Epidural Episiotomy: None Lacerations: None Suture Repair: None Est. Blood Loss (mL): 100  Mom to OB high risk.  Baby to NICU.  Bernerd Limbo, SNM 06/18/2018, 1:35 PM  OB FELLOW DELIVERY ATTESTATION  I was gloved and present for the delivery in its entirety, and I agree with the above SNM's note.    Marcy Siren, D.O. OB Fellow  06/18/2018, 1:57 PM

## 2018-06-15 NOTE — Discharge Instructions (Signed)
Hypertension During Pregnancy ° °Hypertension, commonly called high blood pressure, is when the force of blood pumping through your arteries is too strong. Arteries are blood vessels that carry blood from the heart throughout the body. Hypertension during pregnancy can cause problems for you and your baby. Your baby may be born early (prematurely) or may not weigh as much as he or she should at birth. Very bad cases of hypertension during pregnancy can be life-threatening. °Different types of hypertension can occur during pregnancy. These include: °· Chronic hypertension. This happens when: °? You have hypertension before pregnancy and it continues during pregnancy. °? You develop hypertension before you are [redacted] weeks pregnant, and it continues during pregnancy. °· Gestational hypertension. This is hypertension that develops after the 20th week of pregnancy. °· Preeclampsia, also called toxemia of pregnancy. This is a very serious type of hypertension that develops during pregnancy. It can be very dangerous for you and your baby. °? In rare cases, you may develop preeclampsia after giving birth (postpartum preeclampsia). This usually occurs within 48 hours after childbirth but may occur up to 6 weeks after giving birth. °Gestational hypertension and preeclampsia usually go away within 6 weeks after your baby is born. Women who have hypertension during pregnancy have a greater chance of developing hypertension later in life or during future pregnancies. °What are the causes? °The exact cause of hypertension during pregnancy is not known. °What increases the risk? °There are certain factors that make it more likely for you to develop hypertension during pregnancy. These include: °· Having hypertension during a previous pregnancy or prior to pregnancy. °· Being overweight. °· Being age 35 or older. °· Being pregnant for the first time. °· Being pregnant with more than one baby. °· Becoming pregnant using fertilization  methods such as IVF (in vitro fertilization). °· Having diabetes, kidney problems, or systemic lupus erythematosus. °· Having a family history of hypertension. °What are the signs or symptoms? °Chronic hypertension and gestational hypertension rarely cause symptoms. Preeclampsia causes symptoms, which may include: °· Increased protein in your urine. Your health care provider will check for this at every visit before you give birth (prenatal visit). °· Severe headaches. °· Sudden weight gain. °· Swelling of the hands, face, legs, and feet. °· Nausea and vomiting. °· Vision problems, such as blurred or double vision. °· Numbness in the face, arms, legs, and feet. °· Dizziness. °· Slurred speech. °· Sensitivity to bright lights. °· Abdominal pain. °· Convulsions or seizures. °How is this diagnosed? °You may be diagnosed with hypertension during a routine prenatal exam. At each prenatal visit, you may: °· Have a urine test to check for high amounts of protein in your urine. °· Have your blood pressure checked. A blood pressure reading is given as two numbers, such as "120 over 80" (or 120/80). The first ("top") number is a measure of the pressure in your arteries when your heart beats (systolic pressure). The second ("bottom") number is a measure of the pressure in your arteries as your heart relaxes between beats (diastolic pressure). Blood pressure is measured in a unit called mm Hg. For most women, a normal blood pressure reading is: °? Systolic: below 120. °? Diastolic: below 80. °The type of hypertension that you are diagnosed with depends on your test results and when your symptoms developed. °· Chronic hypertension is usually diagnosed before 20 weeks of pregnancy. °· Gestational hypertension is usually diagnosed after 20 weeks of pregnancy. °· Hypertension with high amounts of protein in   the urine is diagnosed as preeclampsia. °· Blood pressure measurements that stay above 160 systolic, or above 110 diastolic,  are signs of severe preeclampsia. °How is this treated? °Treatment for hypertension during pregnancy varies depending on the type of hypertension you have and how serious it is. °· If you take medicines called ACE inhibitors to treat chronic hypertension, you may need to switch medicines. ACE inhibitors should not be taken during pregnancy. °· If you have gestational hypertension, you may need to take blood pressure medicine. °· If you are at risk for preeclampsia, your health care provider may recommend that you take a low-dose aspirin during your pregnancy. °· If you have severe preeclampsia, you may need to be hospitalized so you and your baby can be monitored closely. You may also need to take medicine (magnesium sulfate) to prevent seizures and to lower blood pressure. This medicine may be given as an injection or through an IV. °· In some cases, if your condition gets worse, you may need to deliver your baby early. °Follow these instructions at home: °Eating and drinking ° °· Drink enough fluid to keep your urine pale yellow. °· Avoid caffeine. °Lifestyle °· Do not use any products that contain nicotine or tobacco, such as cigarettes and e-cigarettes. If you need help quitting, ask your health care provider. °· Do not use alcohol or drugs. °· Avoid stress as much as possible. Rest and get plenty of sleep. °General instructions °· Take over-the-counter and prescription medicines only as told by your health care provider. °· While lying down, lie on your left side. This keeps pressure off your major blood vessels. °· While sitting or lying down, raise (elevate) your feet. Try putting some pillows under your lower legs. °· Exercise regularly. Ask your health care provider what kinds of exercise are best for you. °· Keep all prenatal and follow-up visits as told by your health care provider. This is important. °Contact a health care provider if: °· You have symptoms that your health care provider told you may  require more treatment or monitoring, such as: °? Nausea or vomiting. °? Headache. °Get help right away if you have: °· Severe abdominal pain that does not get better with treatment. °· A severe headache that does not get better. °· Vomiting that does not get better. °· Sudden, rapid weight gain. °· Sudden swelling in your hands, ankles, or face. °· Vaginal bleeding. °· Blood in your urine. °· Fewer movements from your baby than usual. °· Blurred or double vision. °· Muscle twitching or sudden muscle tightening (spasms). °· Shortness of breath. °· Blue fingernails or lips. °Summary °· Hypertension, commonly called high blood pressure, is when the force of blood pumping through your arteries is too strong. °· Hypertension during pregnancy can cause problems for you and your baby. °· Treatment for hypertension during pregnancy varies depending on the type of hypertension you have and how serious it is. °· Get help right away if you have symptoms that your health care provider told you to watch for. °This information is not intended to replace advice given to you by your health care provider. Make sure you discuss any questions you have with your health care provider. °Document Released: 02/17/2011 Document Revised: 05/18/2017 Document Reviewed: 11/15/2015 °Elsevier Interactive Patient Education © 2019 [employer] ° °

## 2018-06-15 NOTE — MAU Note (Signed)
Pt states she has not felt her baby move for the past 5 hours.  Pt also reports that her bp was 179/117 and 176/119 at home.  Pt denies LOF or vag bleeding.  Pt denies headache or visual disturbance.

## 2018-06-15 NOTE — MAU Provider Note (Signed)
Chief Complaint:  Decreased Fetal Movement and Hypertension   First Provider Initiated Contact with Patient 06/15/18 1550    HPI: Rhonda Avila is a 28 y.o. G1P0000 at 733w4dwho presents to maternity admissions reporting elevated blood pressure at home. Has also had decreased fetal movement.   . She denies LOF, vaginal bleeding, vaginal itching/burning, urinary symptoms, h/a, dizziness, n/v, diarrhea, constipation or fever/chills.  She denies headache, visual changes or RUQ abdominal pain.  States has watery eyes but no visual changes   Hypertension  This is a recurrent problem. The problem has been gradually worsening since onset. Associated symptoms include peripheral edema. Pertinent negatives include no anxiety, blurred vision, chest pain, headaches or shortness of breath. There are no associated agents to hypertension. There are no known risk factors for coronary artery disease. Past treatments include nothing. There are no compliance problems.     RN Note: Pt states she has not felt her baby move for the past 5 hours.  Pt also reports that her bp was 179/117 and 176/119 at home.  Pt denies LOF or vag bleeding.  Pt denies headache or visual disturbance  Past Medical History: Past Medical History:  Diagnosis Date  . HSV (herpes simplex virus) infection   . Medical history non-contributory   . Pregnancy induced hypertension     Past obstetric history: OB History  Gravida Para Term Preterm AB Living  1 0 0 0 0 0  SAB TAB Ectopic Multiple Live Births  0 0 0 0 0    # Outcome Date GA Lbr Len/2nd Weight Sex Delivery Anes PTL Lv  1 Current             Past Surgical History: Past Surgical History:  Procedure Laterality Date  . NO PAST SURGERIES      Family History: Family History  Problem Relation Age of Onset  . Hypertension Maternal Grandmother     Social History: Social History   Tobacco Use  . Smoking status: Never Smoker  . Smokeless tobacco: Never  Used  Substance Use Topics  . Alcohol use: No  . Drug use: Not on file    Allergies: No Known Allergies  Meds:  Medications Prior to Admission  Medication Sig Dispense Refill Last Dose  . valACYclovir (VALTREX) 1000 MG tablet Take 1 tablet (1,000 mg total) by mouth daily. 45 tablet 1 06/15/2018 at Unknown time  . Prenatal Vit-Fe Fumarate-FA (PRENATAL MULTIVITAMIN) TABS tablet Take 1 tablet by mouth daily at 12 noon.   Taking    I have reviewed patient's Past Medical Hx, Surgical Hx, Family Hx, Social Hx, medications and allergies.   ROS:  Review of Systems  Eyes: Negative for blurred vision.  Respiratory: Negative for shortness of breath.   Cardiovascular: Negative for chest pain.  Neurological: Negative for headaches.   Other systems negative  Physical Exam   Patient Vitals for the past 24 hrs:  BP Temp Temp src Pulse Resp SpO2 Height Weight  06/15/18 1545 (!) 171/100 - - 67 - 99 % - -  06/15/18 1537 (!) 169/110 98.6 F (37 C) Oral 68 18 99 % 5\' 2"  (1.575 m) -  06/15/18 1526 - - - - - - - 102.2 kg  . Vitals:   06/15/18 1715 06/15/18 1730 06/15/18 1745 06/15/18 1807  BP: (!) 141/97 (!) 152/95 (!) 147/92   Pulse: 76 66 63   Resp:    18  Temp:      TempSrc:  SpO2: 99% 99% 98%   Weight:      Height:        Constitutional: Well-developed, well-nourished female in no acute distress.  Cardiovascular: normal rate and rhythm Respiratory: normal effort, clear to auscultation bilaterally GI: Abd soft, non-tender, gravid appropriate for gestational age.   No rebound or guarding. MS: Extremities nontender, Trace to 1+ pedal edema (states is no worse than usual), normal ROM Neurologic: Alert and oriented x 4.   DTRs 3+ but no clonus. GU: Neg CVAT.  PELVIC EXAM:  deferred  FHT:  Baseline 140 , moderate variability, accelerations present, no decelerations Contractions:  Irregular, mild   Labs: Results for orders placed or performed during the hospital encounter of  06/15/18 (from the past 24 hour(s))  Protein / creatinine ratio, urine     Status: Abnormal   Collection Time: 06/15/18  3:32 PM  Result Value Ref Range   Creatinine, Urine 19.00 mg/dL   Total Protein, Urine 116 mg/dL   Protein Creatinine Ratio 6.11 (H) 0.00 - 0.15 mg/mg[Cre]  Urinalysis, Routine w reflex microscopic     Status: Abnormal   Collection Time: 06/15/18  3:32 PM  Result Value Ref Range   Color, Urine STRAW (A) YELLOW   APPearance CLEAR CLEAR   Specific Gravity, Urine 1.004 (L) 1.005 - 1.030   pH 7.0 5.0 - 8.0   Glucose, UA NEGATIVE NEGATIVE mg/dL   Hgb urine dipstick NEGATIVE NEGATIVE   Bilirubin Urine NEGATIVE NEGATIVE   Ketones, ur NEGATIVE NEGATIVE mg/dL   Protein, ur 527 (A) NEGATIVE mg/dL   Nitrite NEGATIVE NEGATIVE   Leukocytes, UA NEGATIVE NEGATIVE   RBC / HPF 0-5 0 - 5 RBC/hpf   WBC, UA 0-5 0 - 5 WBC/hpf   Bacteria, UA RARE (A) NONE SEEN   Squamous Epithelial / LPF 0-5 0 - 5  Comprehensive metabolic panel     Status: Abnormal   Collection Time: 06/15/18  3:57 PM  Result Value Ref Range   Sodium 134 (L) 135 - 145 mmol/L   Potassium 3.8 3.5 - 5.1 mmol/L   Chloride 106 98 - 111 mmol/L   CO2 21 (L) 22 - 32 mmol/L   Glucose, Bld 72 70 - 99 mg/dL   BUN 10 6 - 20 mg/dL   Creatinine, Ser 7.82 0.44 - 1.00 mg/dL   Calcium 8.8 (L) 8.9 - 10.3 mg/dL   Total Protein 6.6 6.5 - 8.1 g/dL   Albumin 2.8 (L) 3.5 - 5.0 g/dL   AST 20 15 - 41 U/L   ALT 15 0 - 44 U/L   Alkaline Phosphatase 123 38 - 126 U/L   Total Bilirubin 0.4 0.3 - 1.2 mg/dL   GFR calc non Af Amer >60 >60 mL/min   GFR calc Af Amer >60 >60 mL/min   Anion gap 7 5 - 15  CBC     Status: None   Collection Time: 06/15/18  3:57 PM  Result Value Ref Range   WBC 9.2 4.0 - 10.5 K/uL   RBC 4.15 3.87 - 5.11 MIL/uL   Hemoglobin 12.6 12.0 - 15.0 g/dL   HCT 42.3 53.6 - 14.4 %   MCV 90.8 80.0 - 100.0 fL   MCH 30.4 26.0 - 34.0 pg   MCHC 33.4 30.0 - 36.0 g/dL   RDW 31.5 40.0 - 86.7 %   Platelets 161 150 - 400 K/uL    nRBC 0.0 0.0 - 0.2 %   A/Positive/-- (07/22 1503)  Imaging:    MAU  Course/MDM: I have ordered labs and reviewed results. CBC and CMET are normal but Protein/Creat ratio is elevated NST reviewed, reactive Consult Dr Jolayne Panther with presentation, exam findings and test results. She recommends Betamethasone and since she has an appointment in the morning for NST and visit, they can recheck her BP then.  Reviewed preeclampsia precautions.  Will need second Betamethasone tomorrow afternoon.      Discussed she may require IOL early at some point if she becomes severe range or develops symptoms  Assessment: Single intrauterine pregnancy at [redacted]w[redacted]d Preeclampsia with several severe range pressures resolved with med No headache or visual changes. Decreased FM with reactive prolonged NST  Plan: Discharge home Preeclampsia precautions Betamethasone dose #2 due 06/16/18 evening Preterm Labor precautions and fetal kick counts Follow up in Office for prenatal visits and recheck of BP in AM  Encouraged to return here or to other Urgent Care/ED if she develops worsening of symptoms, increase in pain, fever, or other concerning symptoms.  Pt stable at time of discharge.  Wynelle Bourgeois CNM, MSN Certified Nurse-Midwife 06/15/2018 3:50 PM

## 2018-06-16 ENCOUNTER — Ambulatory Visit (HOSPITAL_COMMUNITY): Admit: 2018-06-16 | Payer: Medicaid Other

## 2018-06-16 ENCOUNTER — Inpatient Hospital Stay (HOSPITAL_COMMUNITY)
Admission: AD | Admit: 2018-06-16 | Discharge: 2018-06-21 | DRG: 806 | Disposition: A | Discharge status: Home / Self Care | Payer: Medicaid Other | Attending: Obstetrics & Gynecology | Admitting: Obstetrics & Gynecology

## 2018-06-16 ENCOUNTER — Other Ambulatory Visit: Payer: Self-pay

## 2018-06-16 ENCOUNTER — Encounter: Payer: Self-pay | Admitting: Obstetrics and Gynecology

## 2018-06-16 ENCOUNTER — Encounter (HOSPITAL_COMMUNITY): Payer: Self-pay | Admitting: *Deleted

## 2018-06-16 ENCOUNTER — Inpatient Hospital Stay (HOSPITAL_BASED_OUTPATIENT_CLINIC_OR_DEPARTMENT_OTHER): Payer: Medicaid Other

## 2018-06-16 DIAGNOSIS — O9832 Other infections with a predominantly sexual mode of transmission complicating childbirth: Secondary | ICD-10-CM | POA: Diagnosis present

## 2018-06-16 DIAGNOSIS — O1414 Severe pre-eclampsia complicating childbirth: Secondary | ICD-10-CM | POA: Diagnosis present

## 2018-06-16 DIAGNOSIS — O1413 Severe pre-eclampsia, third trimester: Secondary | ICD-10-CM

## 2018-06-16 DIAGNOSIS — R030 Elevated blood-pressure reading, without diagnosis of hypertension: Secondary | ICD-10-CM | POA: Diagnosis present

## 2018-06-16 DIAGNOSIS — J069 Acute upper respiratory infection, unspecified: Secondary | ICD-10-CM | POA: Diagnosis present

## 2018-06-16 DIAGNOSIS — O99824 Streptococcus B carrier state complicating childbirth: Secondary | ICD-10-CM | POA: Diagnosis present

## 2018-06-16 DIAGNOSIS — O368130 Decreased fetal movements, third trimester, not applicable or unspecified: Secondary | ICD-10-CM | POA: Diagnosis present

## 2018-06-16 DIAGNOSIS — A6000 Herpesviral infection of urogenital system, unspecified: Secondary | ICD-10-CM | POA: Diagnosis present

## 2018-06-16 DIAGNOSIS — O0990 Supervision of high risk pregnancy, unspecified, unspecified trimester: Secondary | ICD-10-CM

## 2018-06-16 DIAGNOSIS — Z3A33 33 weeks gestation of pregnancy: Secondary | ICD-10-CM

## 2018-06-16 DIAGNOSIS — Z8619 Personal history of other infectious and parasitic diseases: Secondary | ICD-10-CM | POA: Diagnosis present

## 2018-06-16 DIAGNOSIS — R0989 Other specified symptoms and signs involving the circulatory and respiratory systems: Secondary | ICD-10-CM | POA: Diagnosis present

## 2018-06-16 DIAGNOSIS — O9952 Diseases of the respiratory system complicating childbirth: Secondary | ICD-10-CM | POA: Diagnosis present

## 2018-06-16 LAB — CBC
HEMATOCRIT: 36.5 % (ref 36.0–46.0)
Hemoglobin: 12.3 g/dL (ref 12.0–15.0)
MCH: 30.3 pg (ref 26.0–34.0)
MCHC: 33.7 g/dL (ref 30.0–36.0)
MCV: 89.9 fL (ref 80.0–100.0)
Platelets: 173 10*3/uL (ref 150–400)
RBC: 4.06 MIL/uL (ref 3.87–5.11)
RDW: 13.5 % (ref 11.5–15.5)
WBC: 9.9 10*3/uL (ref 4.0–10.5)
nRBC: 0 % (ref 0.0–0.2)

## 2018-06-16 LAB — COMPREHENSIVE METABOLIC PANEL
ALT: 16 U/L (ref 0–44)
AST: 21 U/L (ref 15–41)
Albumin: 2.8 g/dL — ABNORMAL LOW (ref 3.5–5.0)
Alkaline Phosphatase: 121 U/L (ref 38–126)
Anion gap: 10 (ref 5–15)
BUN: 9 mg/dL (ref 6–20)
CO2: 20 mmol/L — ABNORMAL LOW (ref 22–32)
Calcium: 8.8 mg/dL — ABNORMAL LOW (ref 8.9–10.3)
Chloride: 104 mmol/L (ref 98–111)
Creatinine, Ser: 0.54 mg/dL (ref 0.44–1.00)
GFR calc Af Amer: >60 mL/min (ref >60)
GFR calc non Af Amer: >60 mL/min (ref >60)
Glucose, Bld: 99 mg/dL (ref 70–99)
Potassium: 3.8 mmol/L (ref 3.5–5.1)
Sodium: 134 mmol/L — ABNORMAL LOW (ref 135–145)
Total Bilirubin: 0.4 mg/dL (ref 0.3–1.2)
Total Protein: 6.6 g/dL (ref 6.5–8.1)

## 2018-06-16 LAB — ABO/RH: ABO/RH(D): A POS

## 2018-06-16 LAB — TYPE AND SCREEN
ABO/RH(D): A POS
ANTIBODY SCREEN: NEGATIVE

## 2018-06-16 MED ORDER — DOCUSATE SODIUM 100 MG PO CAPS
100.0000 mg | ORAL_CAPSULE | Freq: Every day | ORAL | Status: DC
  Administered 2018-06-16 – 2018-06-17 (×2): 100 mg via ORAL
  Filled 2018-06-16 (×3): qty 1

## 2018-06-16 MED ORDER — PRENATAL MULTIVITAMIN CH
1.0000 | ORAL_TABLET | Freq: Every day | ORAL | Status: DC
  Administered 2018-06-16 – 2018-06-17 (×2): 1 via ORAL
  Filled 2018-06-16 (×2): qty 1

## 2018-06-16 MED ORDER — HYDRALAZINE HCL 20 MG/ML IJ SOLN: 10.0000 mg | INTRAMUSCULAR | Status: DC | PRN

## 2018-06-16 MED ORDER — LABETALOL HCL 5 MG/ML IV SOLN: 80.0000 mg | INTRAVENOUS | Status: DC | PRN

## 2018-06-16 MED ORDER — ZOLPIDEM TARTRATE 5 MG PO TABS: 5.0000 mg | ORAL_TABLET | Freq: Every evening | ORAL | Status: DC | PRN

## 2018-06-16 MED ORDER — MAGNESIUM SULFATE BOLUS VIA INFUSION
4.0000 g | Freq: Once | INTRAVENOUS | Status: AC
  Administered 2018-06-16: 4 g via INTRAVENOUS
  Filled 2018-06-16: qty 500

## 2018-06-16 MED ORDER — MAGNESIUM SULFATE 40 G IN LACTATED RINGERS - SIMPLE
2.0000 g/h | INTRAVENOUS | Status: AC
  Administered 2018-06-17 – 2018-06-19 (×3): 2 g/h via INTRAVENOUS
  Filled 2018-06-16 (×4): qty 500

## 2018-06-16 MED ORDER — ACETAMINOPHEN 325 MG PO TABS: 650.0000 mg | ORAL_TABLET | ORAL | Status: DC | PRN

## 2018-06-16 MED ORDER — VALACYCLOVIR HCL 500 MG PO TABS
1000.0000 mg | ORAL_TABLET | Freq: Every day | ORAL | Status: DC
  Administered 2018-06-16: 1000 mg via ORAL
  Filled 2018-06-16 (×3): qty 2

## 2018-06-16 MED ORDER — BETAMETHASONE SOD PHOS & ACET 6 (3-3) MG/ML IJ SUSP
12.0000 mg | Freq: Once | INTRAMUSCULAR | Status: AC
  Administered 2018-06-16: 12 mg via INTRAMUSCULAR
  Filled 2018-06-16: qty 2

## 2018-06-16 MED ORDER — LACTATED RINGERS IV SOLN
INTRAVENOUS | Status: DC
  Administered 2018-06-16 – 2018-06-17 (×5): via INTRAVENOUS

## 2018-06-16 MED ORDER — LABETALOL HCL 5 MG/ML IV SOLN
INTRAVENOUS | Status: AC
  Filled 2018-06-16: qty 4

## 2018-06-16 MED ORDER — LABETALOL HCL 5 MG/ML IV SOLN
40.0000 mg | INTRAVENOUS | Status: DC | PRN
  Administered 2018-06-16: 40 mg via INTRAVENOUS
  Filled 2018-06-16: qty 8

## 2018-06-16 MED ORDER — LABETALOL HCL 5 MG/ML IV SOLN
20.0000 mg | INTRAVENOUS | Status: DC | PRN
  Administered 2018-06-16 – 2018-06-18 (×3): 20 mg via INTRAVENOUS
  Filled 2018-06-16 (×2): qty 4

## 2018-06-16 MED ORDER — CALCIUM CARBONATE ANTACID 500 MG PO CHEW: 2.0000 | CHEWABLE_TABLET | ORAL | Status: DC | PRN

## 2018-06-16 NOTE — MAU Provider Note (Addendum)
History     CSN: [id number]  Arrival date and time: 06/16/18 0608   First Provider Initiated Contact with Patient 06/16/18 [phone]      Chief Complaint  Patient presents with  . Abdominal Pain  . Hypertension  . Leg Pain   G1 @33 .5 wks presenting with elevated BP reading, and numbness in arms and legs. Reports waking around 4am and legs from knees down felt numb. Shortly later her right arm felt numb then her left. She checked her BP and was 170s/110s x2 readings. Denies CP or SOB. No HA, visual disturbances, or epigastric pain. Reports decreased FM, last felt baby around 4am.    OB History    Gravida  1   Para  0   Term  0   Preterm  0   AB  0   Living  0     SAB  0   TAB  0   Ectopic  0   Multiple  0   Live Births  0           Past Medical History:  Diagnosis Date  . HSV (herpes simplex virus) infection   . Medical history non-contributory   . Pregnancy induced hypertension     Past Surgical History:  Procedure Laterality Date  . NO PAST SURGERIES      Family History  Problem Relation Age of Onset  . Hypertension Maternal Grandmother     Social History   Tobacco Use  . Smoking status: Never Smoker  . Smokeless tobacco: Never Used  Substance Use Topics  . Alcohol use: No  . Drug use: Not on file    Allergies: No Known Allergies  Medications Prior to Admission  Medication Sig Dispense Refill Last Dose  . Prenatal Vit-Fe Fumarate-FA (PRENATAL MULTIVITAMIN) TABS tablet Take 1 tablet by mouth daily at 12 noon.   Taking  . valACYclovir (VALTREX) 1000 MG tablet Take 1 tablet (1,000 mg total) by mouth daily. 45 tablet 1 06/15/2018 at Unknown time    Review of Systems  Eyes: Negative for visual disturbance.  Respiratory: Negative for shortness of breath.   Cardiovascular: Negative for chest pain.  Gastrointestinal: Negative for abdominal pain.  Neurological: Negative for headaches.   Physical Exam   Blood pressure (!) 161/98, pulse 70,  temperature 98 F (36.7 C), temperature source Oral, resp. rate 19, weight 101.6 kg, last menstrual period 10/23/2017. Patient Vitals for the past 24 hrs:  BP Temp Temp src Pulse Resp SpO2 Weight  06/16/18 0745 - - - - - 98 % -  06/16/18 0741 (!) 146/88 - - 84 - - -  06/16/18 0740 - - - - - 97 % -  06/16/18 0735 (!) 145/94 - - 77 - 97 % -  06/16/18 0730 - - - - - 98 % -  06/16/18 0727 (!) 162/91 - - 86 - - -  06/16/18 0725 - - - - - 98 % -  06/16/18 0704 (!) 161/98 - - - - - -  06/16/18 0654 (!) 162/102 98 F (36.7 C) Oral 70 19 - 101.6 kg    Physical Exam  Constitutional: She is oriented to person, place, and time. She appears well-developed and well-nourished.  HENT:  Head: Normocephalic and atraumatic.  Neck: Normal range of motion.  Cardiovascular: Normal rate.  Respiratory: Effort normal. No respiratory distress.  GI: Soft. She exhibits no distension. There is no abdominal tenderness.  gravid  Musculoskeletal: Normal range of motion.  General: No edema.  Neurological: She is alert and oriented to person, place, and time.  Skin: Skin is warm and dry.  Psychiatric: She has a normal mood and affect.  EFM: 140 bpm, mod variability, + accels, no decels Toco: none  Results for orders placed or performed during the hospital encounter of 06/16/18 (from the past 24 hour(s))  CBC     Status: None   Collection Time: 06/16/18  7:20 AM  Result Value Ref Range   WBC 9.9 4.0 - 10.5 K/uL   RBC 4.06 3.87 - 5.11 MIL/uL   Hemoglobin 12.3 12.0 - 15.0 g/dL   HCT 40.136.5 02.736.0 - 25.346.0 %   MCV 89.9 80.0 - 100.0 fL   MCH 30.3 26.0 - 34.0 pg   MCHC 33.7 30.0 - 36.0 g/dL   RDW 66.413.5 40.311.5 - 47.415.5 %   Platelets 173 150 - 400 K/uL   nRBC 0.0 0.0 - 0.2 %    MAU Course  Procedures Labetalol IV  MDM Labs ordered. Hx of PEC, dx 2 weeks ago.  Transfer of care given to Leda MinHogan, CNM Bhambri, Melanie, CNM  06/16/2018 8:07 AM   DW Dr. Macon LargeAnyanwu, patient will be admitted to the 3rd floor, and plan  for IOL at 34 weeks.   Assessment and Plan  Pre-eclampsia with severe features  Admit to antenatal Magnesium  Growth US BMZ #2 at 1750 tonight Labetalol protocol as needed NICU consult  Continue valtrex for HSV suppression   Thressa ShellerHeather Hogan DNP, CNM  06/16/18  8:43 AM

## 2018-06-16 NOTE — H&P (Signed)
CSN: [id number]  Arrival date and time: 06/16/18 0608   First Provider Initiated Contact with Patient 06/16/18 [phone]      Chief Complaint  Patient presents with  . Abdominal Pain  . Hypertension  . Leg Pain   G1 @33 .5 wks presenting with elevated BP reading, and numbness in arms and legs. Reports waking around 4am and legs from knees down felt numb. Shortly later her right arm felt numb then her left. She checked her BP and was 170s/110s x2 readings. Denies CP or SOB. No HA, visual disturbances, or epigastric pain. Reports decreased FM, last felt baby around 4am.    OB History    Gravida  1   Para  0   Term  0   Preterm  0   AB  0   Living  0     SAB  0   TAB  0   Ectopic  0   Multiple  0   Live Births  0           Past Medical History:  Diagnosis Date  . HSV (herpes simplex virus) infection   . Medical history non-contributory   . Pregnancy induced hypertension     Past Surgical History:  Procedure Laterality Date  . NO PAST SURGERIES      Family History  Problem Relation Age of Onset  . Hypertension Maternal Grandmother     Social History   Tobacco Use  . Smoking status: Never Smoker  . Smokeless tobacco: Never Used  Substance Use Topics  . Alcohol use: No  . Drug use: Not on file    Allergies: No Known Allergies  Medications Prior to Admission  Medication Sig Dispense Refill Last Dose  . Prenatal Vit-Fe Fumarate-FA (PRENATAL MULTIVITAMIN) TABS tablet Take 1 tablet by mouth daily at 12 noon.   Taking  . valACYclovir (VALTREX) 1000 MG tablet Take 1 tablet (1,000 mg total) by mouth daily. 45 tablet 1 06/15/2018 at Unknown time    Review of Systems  Eyes: Negative for visual disturbance.  Respiratory: Negative for shortness of breath.   Cardiovascular: Negative for chest pain.  Gastrointestinal: Negative for abdominal pain.  Neurological: Negative for headaches.   Physical Exam   Blood pressure (!) 161/98, pulse 70, temperature 98 F  (36.7 C), temperature source Oral, resp. rate 19, weight 101.6 kg, last menstrual period 10/23/2017. Patient Vitals for the past 24 hrs:  BP Temp Temp src Pulse Resp SpO2 Weight  06/16/18 0745 - - - - - 98 % -  06/16/18 0741 (!) 146/88 - - 84 - - -  06/16/18 0740 - - - - - 97 % -  06/16/18 0735 (!) 145/94 - - 77 - 97 % -  06/16/18 0730 - - - - - 98 % -  06/16/18 0727 (!) 162/91 - - 86 - - -  06/16/18 0725 - - - - - 98 % -  06/16/18 0704 (!) 161/98 - - - - - -  06/16/18 0654 (!) 162/102 98 F (36.7 C) Oral 70 19 - 101.6 kg    Physical Exam  Constitutional: She is oriented to person, place, and time. She appears well-developed and well-nourished.  HENT:  Head: Normocephalic and atraumatic.  Neck: Normal range of motion.  Cardiovascular: Normal rate.  Respiratory: Effort normal. No respiratory distress.  GI: Soft. She exhibits no distension. There is no abdominal tenderness.  gravid  Musculoskeletal: Normal range of motion.        General:  No edema.  Neurological: She is alert and oriented to person, place, and time.  Skin: Skin is warm and dry.  Psychiatric: She has a normal mood and affect.  EFM: 140 bpm, mod variability, + accels, no decels Toco: none  Results for orders placed or performed during the hospital encounter of 06/16/18 (from the past 24 hour(s))  CBC     Status: None   Collection Time: 06/16/18  7:20 AM  Result Value Ref Range   WBC 9.9 4.0 - 10.5 K/uL   RBC 4.06 3.87 - 5.11 MIL/uL   Hemoglobin 12.3 12.0 - 15.0 g/dL   HCT 62.136.5 30.836.0 - 65.746.0 %   MCV 89.9 80.0 - 100.0 fL   MCH 30.3 26.0 - 34.0 pg   MCHC 33.7 30.0 - 36.0 g/dL   RDW 84.613.5 96.211.5 - 95.215.5 %   Platelets 173 150 - 400 K/uL   nRBC 0.0 0.0 - 0.2 %    MAU Course  Procedures Labetalol IV  MDM Labs ordered. Hx of PEC, dx 2 weeks ago.  Transfer of care given to Leda MinHogan, CNM Bhambri, Melanie, CNM  06/16/2018 8:07 AM   DW Dr. Macon LargeAnyanwu, patient will be admitted to the 3rd floor, and plan for IOL at 34  weeks.   Assessment and Plan  Pre-eclampsia with severe features  Admit to antenatal Magnesium  Growth US BMZ #2 at 1750 tonight Labetalol protocol as needed NICU consult  Continue Valtrex for HSV suppression GBS +, will need IAP abx  Thressa ShellerHeather Larnell Granlund DNP, CNM  06/16/18  8:43 AM

## 2018-06-16 NOTE — MAU Note (Signed)
Pt presents to MAU c/o tingling down her legs below her knee. Pt also reports her left arm being numb. Pt denies chest pain or SOB. DFM last movement at 0445. Lower abdominal pain.

## 2018-06-16 NOTE — Consult Note (Signed)
Neonatology consultation  [facility] provide prenatal consultation for patient at risk for preterm delivery due to gestational HTN.  Mother is 89 y.o. G1 P0 who is now 33.[redacted] weeks EGA.  She got her 2nd dose of betamethasone today at 1644 and she is on MgSO4, antihypertensives, and Valtrex (HSV suppression).  Discussed usual expectations for preterm infant at 1034 weeks gestation, including possible needs for DR resuscitation, respiratory support, IV access.  Projected possible length of stay in NICU until 37 - [redacted] wks EGA.  Discussed advantages of feeding with mother's milk.  She plans to breast feed and will pump postnatally.  Also discussed use of donor milk as "bridge."  Patient was attentive, had appropriate questions, and was appreciative of my input.  Thank you for consulting Neonatology.  Total time 30 minutes, 15 minutes face-to-face with patient.  JWimmer, MD

## 2018-06-16 NOTE — Progress Notes (Signed)
Patient sitting up eating baby not tracing

## 2018-06-17 DIAGNOSIS — O1413 Severe pre-eclampsia, third trimester: Secondary | ICD-10-CM

## 2018-06-17 DIAGNOSIS — R0989 Other specified symptoms and signs involving the circulatory and respiratory systems: Secondary | ICD-10-CM | POA: Diagnosis present

## 2018-06-17 LAB — COMPREHENSIVE METABOLIC PANEL
ALT: 17 U/L (ref 0–44)
ANION GAP: 10 (ref 5–15)
AST: 22 U/L (ref 15–41)
Albumin: 2.8 g/dL — ABNORMAL LOW (ref 3.5–5.0)
Alkaline Phosphatase: 128 U/L — ABNORMAL HIGH (ref 38–126)
BUN: 10 mg/dL (ref 6–20)
CHLORIDE: 102 mmol/L (ref 98–111)
CO2: 22 mmol/L (ref 22–32)
Calcium: 7.7 mg/dL — ABNORMAL LOW (ref 8.9–10.3)
Creatinine, Ser: 0.49 mg/dL (ref 0.44–1.00)
GFR calc Af Amer: >60 mL/min (ref >60)
GFR calc non Af Amer: >60 mL/min (ref >60)
Glucose, Bld: 95 mg/dL (ref 70–99)
POTASSIUM: 4 mmol/L (ref 3.5–5.1)
Sodium: 134 mmol/L — ABNORMAL LOW (ref 135–145)
Total Bilirubin: 0.5 mg/dL (ref 0.3–1.2)
Total Protein: 6.7 g/dL (ref 6.5–8.1)

## 2018-06-17 LAB — CBC
HCT: 38.9 % (ref 36.0–46.0)
Hemoglobin: 12.8 g/dL (ref 12.0–15.0)
MCH: 30.2 pg (ref 26.0–34.0)
MCHC: 32.9 g/dL (ref 30.0–36.0)
MCV: 91.7 fL (ref 80.0–100.0)
Platelets: 197 10*3/uL (ref 150–400)
RBC: 4.24 MIL/uL (ref 3.87–5.11)
RDW: 13.9 % (ref 11.5–15.5)
WBC: 11.2 10*3/uL — ABNORMAL HIGH (ref 4.0–10.5)
nRBC: 0 % (ref 0.0–0.2)

## 2018-06-17 LAB — MAGNESIUM: Magnesium: 4.4 mg/dL — ABNORMAL HIGH (ref 1.7–2.4)

## 2018-06-17 LAB — RPR: RPR Ser Ql: NONREACTIVE

## 2018-06-17 MED ORDER — TERBUTALINE SULFATE 1 MG/ML IJ SOLN: 0.2500 mg | Freq: Once | INTRAMUSCULAR | Status: DC | PRN

## 2018-06-17 MED ORDER — MISOPROSTOL 25 MCG QUARTER TABLET
25.0000 ug | ORAL_TABLET | ORAL | Status: DC | PRN
  Administered 2018-06-17 – 2018-06-18 (×3): 25 ug via VAGINAL
  Filled 2018-06-17 (×4): qty 1

## 2018-06-17 MED ORDER — LABETALOL HCL 200 MG PO TABS
200.0000 mg | ORAL_TABLET | Freq: Three times a day (TID) | ORAL | Status: DC
  Administered 2018-06-17 – 2018-06-18 (×3): 200 mg via ORAL
  Filled 2018-06-17 (×3): qty 1

## 2018-06-17 MED ORDER — GUAIFENESIN ER 600 MG PO TB12
600.0000 mg | ORAL_TABLET | Freq: Two times a day (BID) | ORAL | Status: DC
  Administered 2018-06-17: 600 mg via ORAL
  Filled 2018-06-17 (×3): qty 1

## 2018-06-17 MED ORDER — MENTHOL 3 MG MT LOZG
1.0000 | LOZENGE | OROMUCOSAL | Status: DC | PRN
  Administered 2018-06-17: 3 mg via ORAL
  Filled 2018-06-17: qty 9

## 2018-06-17 MED ORDER — FENTANYL CITRATE (PF) 100 MCG/2ML IJ SOLN
100.0000 ug | INTRAMUSCULAR | Status: DC | PRN
  Administered 2018-06-18: 100 ug via INTRAVENOUS
  Filled 2018-06-17: qty 2

## 2018-06-17 NOTE — Progress Notes (Signed)
Patient moved to birthing suites.

## 2018-06-17 NOTE — Progress Notes (Signed)
Patient off the monitor for shower 

## 2018-06-17 NOTE — Progress Notes (Signed)
Rhonda Avila is a 28 y.o. G1P0000 at [redacted]w[redacted]d,  TRANSFERRED from [facility] for induction of labor due to preeclampsia with severe features.  Subjective: pt denies h/a, scotoma, ruq pain. No vision changes.   Objective: BP (!) 158/99   Pulse 87   Temp 98.1 F (36.7 C) (Oral)   Resp 20   Ht 5\' 2"  (1.575 m)   Wt 101.6 kg   LMP 10/23/2017 (Exact Date)   SpO2 97%   BMI 40.97 kg/m  I/O last 3 completed shifts: In: 8865.7 [P.O.:4630; I.V.:4235.7] Out: 8875 [Urine:8875] No intake/output data recorded. She has completed betamethasone tx, and is on Mag sulfate with good UOP and normal Renal fxn. FHT:  FHR: 145 bpm, variability: moderate,  accelerations:  Present,  decelerations:  Absent UC:   none SVE:   Dilation: Closed Effacement (%): 20 Station: -3 Exam by:: Dr. Emelda Fear First cytotec placed at 7 pm by JVF Labs: Lab Results  Component Value Date   WBC 11.2 (H) 06/17/2018   HGB 12.8 06/17/2018   HCT 38.9 06/17/2018   MCV 91.7 06/17/2018   PLT 197 06/17/2018    Assessment / Plan: Induction of labor for preeclampsia with severe features beginning cervical ripening.  Labor: not yet, ripening at present Preeclampsia:  on magnesium sulfate, and will begin Labetalol 200 q 8 h po Fetal Wellbeing:  Category I Pain Control:  Labor support without medications I/D:  n/a Anticipated MOD:  NSVD  Tilda Burrow 06/17/2018, 7:44 PM

## 2018-06-17 NOTE — Progress Notes (Signed)
FACULTY PRACTICE ANTEPARTUM COMPREHENSIVE PROGRESS NOTE  Rhonda Avila is a 28 y.o. G1P0000 at [redacted]w[redacted]d who is admitted for Severe Preeclampsia.  Estimated Date of Delivery: 07/30/18 Fetal presentation is cephalic.  Length of Stay:  1 Days. Admitted 06/16/2018  Subjective: Still on magnesium sulfate.  Patient denies any headaches, visual symptoms, RUQ/epigastric pain. Patient reports good fetal movement.  She reports no uterine contractions, no bleeding and no loss of fluid per vagina. She does report continued sore throat and coughing, this has been going on for two weeks, negative Flu swab. No fevers.  Not severe right now but desires cough medicine and something for her sore throat.   Vitals:  Blood pressure (!) 155/118, pulse 97, temperature 98.9 F (37.2 C), temperature source Oral, resp. rate 15, height 5\' 2"  (1.575 m), weight 101.6 kg, last menstrual period 10/23/2017, SpO2 93 %. Physical Examination: CONSTITUTIONAL: Well-developed, well-nourished female in no acute distress.  HENT:  Normocephalic, atraumatic, External right and left ear normal. Oropharynx is clear and moist. No exudate on tonsils, no erythema noted.  EYES: Conjunctivae and EOM are normal. Pupils are equal, round, and reactive to light. No scleral icterus.  NECK: Normal range of motion, supple, no masses SKIN: Skin is warm and dry. No rash noted. Not diaphoretic. No erythema. No pallor. NEUROLGIC: Alert and oriented to person, place, and time. Normal reflexes, muscle tone coordination. No cranial nerve deficit noted. PSYCHIATRIC: Normal mood and affect. Normal behavior. Normal judgment and thought content. CARDIOVASCULAR: Normal heart rate noted, regular rhythm RESPIRATORY: Clear to auscultation bilaterally. Effort and breath sounds normal, no problems with respiration noted MUSCULOSKELETAL: Normal range of motion. No edema and no tenderness. 2+ distal pulses. ABDOMEN: Soft, nontender, nondistended,  gravid. CERVIX:  Deferred  Fetal monitoring: FHR: 130 bpm, Variability: moderate, Accelerations: Present, Decelerations: Absent  Uterine activity: No contractions  Results for orders placed or performed during the hospital encounter of 06/16/18 (from the past 48 hour(s))  Comprehensive metabolic panel     Status: Abnormal   Collection Time: 06/16/18  7:20 AM  Result Value Ref Range   Sodium 134 (L) 135 - 145 mmol/L   Potassium 3.8 3.5 - 5.1 mmol/L   Chloride 104 98 - 111 mmol/L   CO2 20 (L) 22 - 32 mmol/L   Glucose, Bld 99 70 - 99 mg/dL   BUN 9 6 - 20 mg/dL   Creatinine, Ser 1.61 0.44 - 1.00 mg/dL   Calcium 8.8 (L) 8.9 - 10.3 mg/dL   Total Protein 6.6 6.5 - 8.1 g/dL   Albumin 2.8 (L) 3.5 - 5.0 g/dL   AST 21 15 - 41 U/L   ALT 16 0 - 44 U/L   Alkaline Phosphatase 121 38 - 126 U/L   Total Bilirubin 0.4 0.3 - 1.2 mg/dL   GFR calc non Af Amer >60 >60 mL/min   GFR calc Af Amer >60 >60 mL/min   Anion gap 10 5 - 15    Comment: Performed at [facility], [address], [place], [place] [address]  CBC     Status: None   Collection Time: 06/16/18  7:20 AM  Result Value Ref Range   WBC 9.9 4.0 - 10.5 K/uL   RBC 4.06 3.87 - 5.11 MIL/uL   Hemoglobin 12.3 12.0 - 15.0 g/dL   HCT 54.0 98.1 - 19.1 %   MCV 89.9 80.0 - 100.0 fL   MCH 30.3 26.0 - 34.0 pg   MCHC 33.7 30.0 - 36.0 g/dL  RDW 13.5 11.5 - 15.5 %   Platelets 173 150 - 400 K/uL   nRBC 0.0 0.0 - 0.2 %    Comment: Performed at [facility], [address], [place], [place] [address]  Type and screen [facility]     Status: None   Collection Time: 06/16/18  9:03 AM  Result Value Ref Range   ABO/RH(D) A POS    Antibody Screen NEG    Sample Expiration      06/19/2018 Performed at [facility], [address] [place], [place] [address]   ABO/Rh     Status: None   Collection Time: 06/16/18  9:03 AM  Result Value Ref Range   ABO/RH(D)      A POS Performed at [facility], [address],  [place], [place] [address]     [place] Mfm Fetal Bpp Wo Non Stress  Result Date: 06/16/2018 ----------------------------------------------------------------------  OBSTETRICS REPORT                       (Signed Final 06/16/2018 04:47 pm) ---------------------------------------------------------------------- Patient Info  ID #:       [id number]                          D.O.B.:  [birth year] (27 yrs)  Name:       Rhonda Avila [facility]            Visit Date: 06/16/2018 04:38 pm              MARTINEZ ---------------------------------------------------------------------- Performed By  Performed By:     Emeline Darling BS,      Ref. Address:     [facility]                                                             OB/Gyn Clinic                                                             [address]                                                             [place], [place]  [address]  Attending:        Noralee Space MD        Secondary Phy.:   3rd Nursing- HR                                                             OB                                                             3rd Floor  Referred By:      [facility]       Location:         [facility] for                    [facility]                    Healthcare ---------------------------------------------------------------------- Orders   #  Description                          Code         Ordered By   1  [place] MFM OB FOLLOW UP                  [address].09     Jaynie Collins   2  [place] MFM FETAL BPP WO NON              76819.01     Linsi Humann      STRESS   3  [place] MFM UA CORD DOPPLER               81191.47     Jaynie Collins  ----------------------------------------------------------------------   #  Order #                    Accession #                 Episode #   1  [id number]                   [id number]                  [id number]   2  [id number]                  [id number]                  [id number]   3  [id number]                  [id number]                  [id number]  ---------------------------------------------------------------------- Indications   Hypertension - Gestational                     O13.9   Severe preeclampsia, third trimester           O14.13   Medical  complication of pregnancy (PCOS -      O26.90   metformin)   [redacted] weeks gestation of pregnancy                Z3A.33  ---------------------------------------------------------------------- Fetal Evaluation  Num Of Fetuses:         1  Fetal Heart Rate(bpm):  144  Cardiac Activity:       Observed  Presentation:           Cephalic  Placenta:               Anterior  P. Cord Insertion:      Previously Visualized  Amniotic Fluid  AFI FV:      Within normal limits  AFI Sum(cm)     %Tile       Largest Pocket(cm)  13.62           45          5.18  RUQ(cm)       RLQ(cm)       LUQ(cm)        LLQ(cm)  4.98          5.18          2.69           0.77 ---------------------------------------------------------------------- Biophysical Evaluation  Amniotic F.V:   Pocket => 2 cm two         F. Tone:        Observed                  planes  F. Movement:    Observed                   Score:          8/8  F. Breathing:   Observed ---------------------------------------------------------------------- Biometry  BPD:      81.9  mm     G. Age:  33w 0d         24  %    CI:        75.28   %    70 - 86                                                          FL/HC:      20.8   %    19.4 - 21.8  HC:      299.4  mm     G. Age:  33w 1d          9  %    HC/AC:      1.10        0.96 - 1.11  AC:      271.8  mm     G. Age:  31w 2d          4  %    FL/BPD:     75.9   %    71 - 87  FL:       62.2  mm     G. Age:  32w 1d         10  %    FL/AC:      22.9   %    20 - 24  Est. FW:    1858  gm  4 lb 2 oz     25  %  ---------------------------------------------------------------------- OB History  Gravidity:    1         Term:   0        Prem:   0        SAB:   0  TOP:          0       Ectopic:  0        Living: 0 ---------------------------------------------------------------------- Gestational Age  LMP:           33w 5d        Date:  10/23/17                 EDD:   07/30/18  U/S Today:     32w 3d                                        EDD:   08/08/18  Best:          33w 5d     Det. By:  LMP  (10/23/17)          EDD:   07/30/18 ---------------------------------------------------------------------- Anatomy  Cranium:               Appears normal         Aortic Arch:            Previously seen  Cavum:                 Previously seen        Ductal Arch:            Previously seen  Ventricles:            Appears normal         Diaphragm:              Appears normal  Choroid Plexus:        Previously seen        Stomach:                Appears normal, left                                                                        sided  Cerebellum:            Previously seen        Abdomen:                Appears normal  Posterior Fossa:       Previously seen        Abdominal Wall:         Previously seen  Nuchal Fold:           Previously seen        Cord Vessels:           Previously seen  Face:                  Orbits and profile     Kidneys:  Appear normal                         previously seen  Lips:                  Previously seen        Bladder:                Appears normal  Thoracic:              Appears normal         Spine:                  Previously seen  Heart:                 Previously seen        Upper Extremities:      Previously seen  RVOT:                  Previously seen        Lower Extremities:      Previously seen  LVOT:                  Previously seen  Other:  Heels, 5th digit, and Nasal bone previously visualized. Technically          difficult due to fetal position. Technically difficult due  to maternal          habitus. ---------------------------------------------------------------------- Doppler - Fetal Vessels  Umbilical Artery   S/D     %tile     RI              PI                     ADFV    RDFV  3.35       87     0.7            1.19                        No      No ---------------------------------------------------------------------- Cervix Uterus Adnexa  Cervix  Not visualized (advanced GA >24wks) ---------------------------------------------------------------------- Impression  Patient is admitted with severe range blood pressures.  Increased proteinuria confirms the diagnosis of preeclampsia  with severe features. Labs including liver enzymes, creatinine  and platelets are normal.  Fetal growth is appropriate for gestational age. Abdominal  circumference measurement is at less than the 5th  percentile. Amniotic fluid is normal and good fetal activity is  seen. Antenatal testing is reassuring. BPP 8/8. Umbilical  artery Doppler showed normal diastolic flow.  Discussed with Dr. Macon Large. ---------------------------------------------------------------------- Recommendations  -Recommend delivery at 34 weeks. ----------------------------------------------------------------------                  Noralee Space, MD Electronically Signed Final Report   06/16/2018 04:47 pm ----------------------------------------------------------------------  [place] Mfm Ob Follow Up  Result Date: 06/16/2018 ----------------------------------------------------------------------  OBSTETRICS REPORT                       (Signed Final 06/16/2018 04:47 pm) ---------------------------------------------------------------------- Patient Info  ID #:       [id number]                          D.O.B.:  1990-09-29 (27 yrs)  Name:       [facility]  Visit Date: 06/16/2018 04:38 pm              MARTINEZ ---------------------------------------------------------------------- Performed By  Performed By:     Emeline Darling BS,      Ref. Address:     [facility]                                                             OB/Gyn Clinic                                                             [address]                                                             [place], [place]                                                             [address]  Attending:        Noralee Space MD        Secondary Phy.:   3rd Nursing- HR                                                             OB                                                             3rd Floor  Referred By:      [facility]       Location:         [facility] for                    [facility]  Healthcare ---------------------------------------------------------------------- Orders   #  Description                          Code         Ordered By   1  [place] MFM OB FOLLOW UP                  [id number]     Jaynie Collins   2  [place] MFM FETAL BPP WO NON              76819.01     Merit Gadsby      STRESS   3  [place] MFM UA CORD DOPPLER               76820.02     Dwyne Hasegawa  ----------------------------------------------------------------------   #  Order #                    Accession #                 Episode #   1  [id number]                  [id number]                  [id number]   2  [id number]                  [id number]                  [id number]   3  [id number]                  [id number]                  [id number]  ---------------------------------------------------------------------- Indications   Hypertension - Gestational                     O13.9   Severe preeclampsia, third trimester           O14.13   Medical complication of pregnancy (PCOS -      O26.90   metformin)   [redacted] weeks gestation of pregnancy                Z3A.33  ---------------------------------------------------------------------- Fetal Evaluation  Num Of Fetuses:          1  Fetal Heart Rate(bpm):  144  Cardiac Activity:       Observed  Presentation:           Cephalic  Placenta:               Anterior  P. Cord Insertion:      Previously Visualized  Amniotic Fluid  AFI FV:      Within normal limits  AFI Sum(cm)     %Tile       Largest Pocket(cm)  13.62           45          5.18  RUQ(cm)       RLQ(cm)       LUQ(cm)        LLQ(cm)  4.98          5.18          2.69           0.77 ---------------------------------------------------------------------- Biophysical Evaluation  Amniotic F.V:   Pocket => 2 cm two  F. Tone:        Observed                  planes  F. Movement:    Observed                   Score:          8/8  F. Breathing:   Observed ---------------------------------------------------------------------- Biometry  BPD:      81.9  mm     G. Age:  33w 0d         24  %    CI:        75.28   %    70 - 86                                                          FL/HC:      20.8   %    19.4 - 21.8  HC:      299.4  mm     G. Age:  33w 1d          9  %    HC/AC:      1.10        0.96 - 1.11  AC:      271.8  mm     G. Age:  31w 2d          4  %    FL/BPD:     75.9   %    71 - 87  FL:       62.2  mm     G. Age:  32w 1d         10  %    FL/AC:      22.9   %    20 - 24  Est. FW:    1858  gm      4 lb 2 oz     25  % ---------------------------------------------------------------------- OB History  Gravidity:    1         Term:   0        Prem:   0        SAB:   0  TOP:          0       Ectopic:  0        Living: 0 ---------------------------------------------------------------------- Gestational Age  LMP:           33w 5d        Date:  10/23/17                 EDD:   07/30/18  U/S Today:     32w 3d                                        EDD:   08/08/18  Best:          33w 5d     Det. By:  LMP  (10/23/17)          EDD:   07/30/18 ---------------------------------------------------------------------- Anatomy  Cranium:               Appears normal  Aortic Arch:             Previously seen  Cavum:                 Previously seen        Ductal Arch:            Previously seen  Ventricles:            Appears normal         Diaphragm:              Appears normal  Choroid Plexus:        Previously seen        Stomach:                Appears normal, left                                                                        sided  Cerebellum:            Previously seen        Abdomen:                Appears normal  Posterior Fossa:       Previously seen        Abdominal Wall:         Previously seen  Nuchal Fold:           Previously seen        Cord Vessels:           Previously seen  Face:                  Orbits and profile     Kidneys:                Appear normal                         previously seen  Lips:                  Previously seen        Bladder:                Appears normal  Thoracic:              Appears normal         Spine:                  Previously seen  Heart:                 Previously seen        Upper Extremities:      Previously seen  RVOT:                  Previously seen        Lower Extremities:      Previously seen  LVOT:                  Previously seen  Other:  Heels, 5th digit, and Nasal bone previously visualized. Technically          difficult due to fetal position. Technically difficult due to maternal  habitus. ---------------------------------------------------------------------- Doppler - Fetal Vessels  Umbilical Artery   S/D     %tile     RI              PI                     ADFV    RDFV  3.35       87     0.7            1.19                        No      No ---------------------------------------------------------------------- Cervix Uterus Adnexa  Cervix  Not visualized (advanced GA >24wks) ---------------------------------------------------------------------- Impression  Patient is admitted with severe range blood pressures.  Increased proteinuria confirms the diagnosis of preeclampsia  with severe features. Labs including liver  enzymes, creatinine  and platelets are normal.  Fetal growth is appropriate for gestational age. Abdominal  circumference measurement is at less than the 5th  percentile. Amniotic fluid is normal and good fetal activity is  seen. Antenatal testing is reassuring. BPP 8/8. Umbilical  artery Doppler showed normal diastolic flow.  Discussed with Dr. Macon LargeAnyanwu. ---------------------------------------------------------------------- Recommendations  -Recommend delivery at 34 weeks. ----------------------------------------------------------------------                  Noralee Spaceavi Shankar, MD Electronically Signed Final Report   06/16/2018 04:47 pm ----------------------------------------------------------------------  [place] Mfm Ua Cord Doppler  Result Date: 06/16/2018 ----------------------------------------------------------------------  OBSTETRICS REPORT                       (Signed Final 06/16/2018 04:47 pm) ---------------------------------------------------------------------- Patient Info  ID #:       [id number]                          D.O.B.:  08/15/1990 (27 yrs)  [facility]       [facility]            Visit Date: 06/16/2018 04:38 pm              MARTINEZ ---------------------------------------------------------------------- Performed By  Performed By:     Emeline DarlingKasie E Kiser BS,      Ref. Address:     [facility]                    [facility]                                                             OB/Gyn Clinic                                                             [address]                                                             [place], [place]                                                             [address]  Attending:        Noralee Space MD        Secondary Phy.:   3rd Nursing- HR                                                             OB                                                             3rd Floor  Referred By:      [facility]       Location:         [facility] for                    [facility]                    Healthcare ---------------------------------------------------------------------- Orders   #  Description                          Code         Ordered By   1  [place] MFM OB FOLLOW UP                  56387.56     Jaynie Collins   2  [place] MFM FETAL BPP WO NON              76819.01     Kandas Oliveto      STRESS   3  [place] MFM UA CORD DOPPLER               43329.51     Jaynie Collins  ----------------------------------------------------------------------   #  Order #                    Accession #                 Episode #   1  [id number]                  [id number]                  [id number]   2  [id number]  [id number]                  [id number]   3  [id number]                  [phone]                  [id number]  ---------------------------------------------------------------------- Indications   Hypertension - Gestational                     O13.9   Severe preeclampsia, third trimester           O14.13   Medical complication of pregnancy (PCOS -      O26.90   metformin)   [redacted] weeks gestation of pregnancy                Z3A.33  ---------------------------------------------------------------------- Fetal Evaluation  Num Of Fetuses:         1  Fetal Heart Rate(bpm):  144  Cardiac Activity:       Observed  Presentation:           Cephalic  Placenta:               Anterior  P. Cord Insertion:      Previously Visualized  Amniotic Fluid  AFI FV:      Within normal limits  AFI Sum(cm)     %Tile       Largest Pocket(cm)  13.62           45          5.18  RUQ(cm)       RLQ(cm)       LUQ(cm)        LLQ(cm)  4.98          5.18          2.69           0.77 ---------------------------------------------------------------------- Biophysical Evaluation  Amniotic F.V:   Pocket => 2 cm two         F. Tone:        Observed                  planes  F. Movement:    Observed                   Score:           8/8  F. Breathing:   Observed ---------------------------------------------------------------------- Biometry  BPD:      81.9  mm     G. Age:  33w 0d         24  %    CI:        75.28   %    70 - 86                                                          FL/HC:      20.8   %    19.4 - 21.8  HC:      299.4  mm     G. Age:  33w 1d          9  %    HC/AC:      1.10        0.96 - 1.11  AC:      271.8  mm     G. Age:  31w 2d          4  %    FL/BPD:     75.9   %    71 - 87  FL:       62.2  mm     G. Age:  32w 1d         10  %    FL/AC:      22.9   %    20 - 24  Est. FW:    1858  gm      4 lb 2 oz     25  % ---------------------------------------------------------------------- OB History  Gravidity:    1         Term:   0        Prem:   0        SAB:   0  TOP:          0       Ectopic:  0        Living: 0 ---------------------------------------------------------------------- Gestational Age  LMP:           33w 5d        Date:  10/23/17                 EDD:   07/30/18  U/S Today:     32w 3d                                        EDD:   08/08/18  Best:          33w 5d     Det. By:  LMP  (10/23/17)          EDD:   07/30/18 ---------------------------------------------------------------------- Anatomy  Cranium:               Appears normal         Aortic Arch:            Previously seen  Cavum:                 Previously seen        Ductal Arch:            Previously seen  Ventricles:            Appears normal         Diaphragm:              Appears normal  Choroid Plexus:        Previously seen        Stomach:                Appears normal, left                                                                        sided  Cerebellum:            Previously seen        Abdomen:  Appears normal  Posterior Fossa:       Previously seen        Abdominal Wall:         Previously seen  Nuchal Fold:           Previously seen        Cord Vessels:           Previously seen  Face:                  Orbits and profile      Kidneys:                Appear normal                         previously seen  Lips:                  Previously seen        Bladder:                Appears normal  Thoracic:              Appears normal         Spine:                  Previously seen  Heart:                 Previously seen        Upper Extremities:      Previously seen  RVOT:                  Previously seen        Lower Extremities:      Previously seen  LVOT:                  Previously seen  Other:  Heels, 5th digit, and Nasal bone previously visualized. Technically          difficult due to fetal position. Technically difficult due to maternal          habitus. ---------------------------------------------------------------------- Doppler - Fetal Vessels  Umbilical Artery   S/D     %tile     RI              PI                     ADFV    RDFV  3.35       87     0.7            1.19                        No      No ---------------------------------------------------------------------- Cervix Uterus Adnexa  Cervix  Not visualized (advanced GA >24wks) ---------------------------------------------------------------------- Impression  Patient is admitted with severe range blood pressures.  Increased proteinuria confirms the diagnosis of preeclampsia  with severe features. Labs including liver enzymes, creatinine  and platelets are normal.  Fetal growth is appropriate for gestational age. Abdominal  circumference measurement is at less than the 5th  percentile. Amniotic fluid is normal and good fetal activity is  seen. Antenatal testing is reassuring. BPP 8/8. Umbilical  artery Doppler showed normal diastolic flow.  Discussed with Dr. Macon Large. ---------------------------------------------------------------------- Recommendations  -Recommend delivery at 34 weeks. ----------------------------------------------------------------------  Noralee Space, MD Electronically Signed Final Report   06/16/2018 04:47 pm  ----------------------------------------------------------------------   Current scheduled medications . docusate sodium  100 mg Oral Daily  . guaiFENesin  600 mg Oral BID  . prenatal multivitamin  1 tablet Oral Q1200  . valACYclovir  1,000 mg Oral Daily    I have reviewed the patient's current medications.  ASSESSMENT: Principal Problem:   Severe preeclampsia, third trimester Active Problems:   History of herpes genitalis   Supervision of high risk pregnancy, antepartum   Symptoms of upper respiratory infection (URI)   PLAN: Continue magnesium sulfate for eclampsia prophylaxis for now, and labetalol protocol. Will proceed with IOL tomorrow at [place].   Labs (including L&D admission labs) ordered today; will also check magnesium level. She is s/p two doses of betamethasone, also had NICU consult. Appreciate their input.  Had BPP 10/10, EFW 25%, normal AFV, normal dopplers.  Guaifenesin and Cepacol ordered for cough and sore throat.   Continue HSV prophylaxis with Valtrex Continue routine antenatal care until she moves to L&D tonight for IOL   Jaynie Collins, MD, FACOG Obstetrician & Gynecologist, [facility] for [employer], [facility]

## 2018-06-17 NOTE — Anesthesia Pain Management Evaluation Note (Signed)
  CRNA Pain Management Visit Note  Patient: Rhonda Avila, 28 y.o., female  "Hello I am a member of the anesthesia team at [facility]. We have an anesthesia team available at all times to provide care throughout the hospital, including epidural management and anesthesia for C-section. I don't know your plan for the delivery whether it a natural birth, water birth, IV sedation, nitrous supplementation, doula or epidural, but we want to meet your pain goals."   1.Was your pain managed to your expectations on prior hospitalizations?   No prior hospitalizations  2.What is your expectation for pain management during this hospitalization?     Labor support without medications  3.How can we help you reach that goal? available  Record the patient's initial score and the patient's pain goal.   Pain: 0  Pain Goal: 7 [facility] wants you to be able to say your pain was always managed very well.  Durell Lofaso 06/17/2018

## 2018-06-18 ENCOUNTER — Inpatient Hospital Stay (HOSPITAL_COMMUNITY): Payer: Medicaid Other | Admitting: Anesthesiology

## 2018-06-18 ENCOUNTER — Encounter (HOSPITAL_COMMUNITY): Payer: Self-pay

## 2018-06-18 LAB — COMPREHENSIVE METABOLIC PANEL
ALT: 19 U/L (ref 0–44)
AST: 29 U/L (ref 15–41)
Albumin: 2.4 g/dL — ABNORMAL LOW (ref 3.5–5.0)
Alkaline Phosphatase: 116 U/L (ref 38–126)
Anion gap: 8 (ref 5–15)
BUN: 11 mg/dL (ref 6–20)
CO2: 23 mmol/L (ref 22–32)
Calcium: 7.5 mg/dL — ABNORMAL LOW (ref 8.9–10.3)
Chloride: 103 mmol/L (ref 98–111)
Creatinine, Ser: 0.47 mg/dL (ref 0.44–1.00)
GFR calc Af Amer: >60 mL/min (ref >60)
Glucose, Bld: 80 mg/dL (ref 70–99)
Potassium: 4.4 mmol/L (ref 3.5–5.1)
Sodium: 134 mmol/L — ABNORMAL LOW (ref 135–145)
Total Bilirubin: 0.1 mg/dL — ABNORMAL LOW (ref 0.3–1.2)
Total Protein: 5.5 g/dL — ABNORMAL LOW (ref 6.5–8.1)

## 2018-06-18 LAB — CBC
HEMATOCRIT: 36.1 % (ref 36.0–46.0)
Hemoglobin: 11.8 g/dL — ABNORMAL LOW (ref 12.0–15.0)
MCH: 30.1 pg (ref 26.0–34.0)
MCHC: 32.7 g/dL (ref 30.0–36.0)
MCV: 92.1 fL (ref 80.0–100.0)
Platelets: 183 10*3/uL (ref 150–400)
RBC: 3.92 MIL/uL (ref 3.87–5.11)
RDW: 14.1 % (ref 11.5–15.5)
WBC: 10 10*3/uL (ref 4.0–10.5)
nRBC: 0 % (ref 0.0–0.2)

## 2018-06-18 LAB — PROTEIN / CREATININE RATIO, URINE
Creatinine, Urine: 54 mg/dL
Protein Creatinine Ratio: 6.37 mg/mg{Cre} — ABNORMAL HIGH (ref 0.00–0.15)
Total Protein, Urine: 344 mg/dL

## 2018-06-18 MED ORDER — MISOPROSTOL 200 MCG PO TABS
ORAL_TABLET | ORAL | Status: AC
  Administered 2018-06-18: 800 ug via RECTAL
  Filled 2018-06-18: qty 4

## 2018-06-18 MED ORDER — LACTATED RINGERS IV SOLN
500.0000 mL | INTRAVENOUS | Status: DC | PRN
  Administered 2018-06-18: 250 mL via INTRAVENOUS

## 2018-06-18 MED ORDER — IBUPROFEN 600 MG PO TABS
600.0000 mg | ORAL_TABLET | Freq: Four times a day (QID) | ORAL | Status: DC
  Administered 2018-06-18 – 2018-06-21 (×11): 600 mg via ORAL
  Filled 2018-06-18 (×11): qty 1

## 2018-06-18 MED ORDER — LACTATED RINGERS IV SOLN
INTRAVENOUS | Status: DC
  Administered 2018-06-18 – 2018-06-19 (×2): via INTRAVENOUS

## 2018-06-18 MED ORDER — ONDANSETRON HCL 4 MG PO TABS
4.0000 mg | ORAL_TABLET | ORAL | Status: DC | PRN
  Administered 2018-06-18: 4 mg via ORAL
  Filled 2018-06-18: qty 1

## 2018-06-18 MED ORDER — LACTATED RINGERS IV SOLN
INTRAVENOUS | Status: DC
  Administered 2018-06-18: 10:00:00 via INTRAVENOUS

## 2018-06-18 MED ORDER — SODIUM CHLORIDE 0.9 % IV SOLN
5.0000 10*6.[IU] | Freq: Once | INTRAVENOUS | Status: AC
  Administered 2018-06-18: 5 10*6.[IU] via INTRAVENOUS
  Filled 2018-06-18: qty 5

## 2018-06-18 MED ORDER — BENZOCAINE-MENTHOL 20-0.5 % EX AERO
1.0000 "application " | INHALATION_SPRAY | CUTANEOUS | Status: DC | PRN
  Filled 2018-06-18: qty 56

## 2018-06-18 MED ORDER — DIPHENHYDRAMINE HCL 25 MG PO CAPS: 25.0000 mg | ORAL_CAPSULE | Freq: Four times a day (QID) | ORAL | Status: DC | PRN

## 2018-06-18 MED ORDER — OXYCODONE-ACETAMINOPHEN 5-325 MG PO TABS: 2.0000 | ORAL_TABLET | ORAL | Status: DC | PRN

## 2018-06-18 MED ORDER — SOD CITRATE-CITRIC ACID 500-334 MG/5ML PO SOLN: 30.0000 mL | ORAL | Status: DC | PRN

## 2018-06-18 MED ORDER — PHENYLEPHRINE 40 MCG/ML (10ML) SYRINGE FOR IV PUSH (FOR BLOOD PRESSURE SUPPORT)
PREFILLED_SYRINGE | INTRAVENOUS | Status: AC
  Filled 2018-06-18: qty 20

## 2018-06-18 MED ORDER — ONDANSETRON HCL 4 MG/2ML IJ SOLN: 4.0000 mg | Freq: Four times a day (QID) | INTRAMUSCULAR | Status: DC | PRN

## 2018-06-18 MED ORDER — OXYTOCIN 40 UNITS IN LACTATED RINGERS INFUSION - SIMPLE MED
2.5000 [IU]/h | INTRAVENOUS | Status: DC
  Administered 2018-06-18: 2.5 [IU]/h via INTRAVENOUS

## 2018-06-18 MED ORDER — MISOPROSTOL 200 MCG PO TABS: 800.0000 ug | ORAL_TABLET | Freq: Once | ORAL | Status: AC

## 2018-06-18 MED ORDER — ACETAMINOPHEN 325 MG PO TABS: 650.0000 mg | ORAL_TABLET | ORAL | Status: DC | PRN

## 2018-06-18 MED ORDER — PHENYLEPHRINE 40 MCG/ML (10ML) SYRINGE FOR IV PUSH (FOR BLOOD PRESSURE SUPPORT)
80.0000 ug | PREFILLED_SYRINGE | INTRAVENOUS | Status: DC | PRN
  Filled 2018-06-18: qty 10

## 2018-06-18 MED ORDER — PENICILLIN G 3 MILLION UNITS IVPB - SIMPLE MED
3.0000 10*6.[IU] | INTRAVENOUS | Status: DC
  Administered 2018-06-18: 3 10*6.[IU] via INTRAVENOUS
  Filled 2018-06-18 (×4): qty 100

## 2018-06-18 MED ORDER — EPHEDRINE 5 MG/ML INJ
10.0000 mg | INTRAVENOUS | Status: DC | PRN
  Filled 2018-06-18: qty 2

## 2018-06-18 MED ORDER — LISINOPRIL 10 MG PO TABS
10.0000 mg | ORAL_TABLET | Freq: Every day | ORAL | Status: DC
  Administered 2018-06-18: 10 mg via ORAL
  Filled 2018-06-18 (×2): qty 1

## 2018-06-18 MED ORDER — LIDOCAINE HCL (PF) 1 % IJ SOLN
INTRAMUSCULAR | Status: DC | PRN
  Administered 2018-06-18: 13 mL via EPIDURAL

## 2018-06-18 MED ORDER — LIDOCAINE HCL (PF) 1 % IJ SOLN
INTRAMUSCULAR | Status: AC
  Filled 2018-06-18: qty 30

## 2018-06-18 MED ORDER — SIMETHICONE 80 MG PO CHEW: 80.0000 mg | CHEWABLE_TABLET | ORAL | Status: DC | PRN

## 2018-06-18 MED ORDER — OXYTOCIN BOLUS FROM INFUSION
500.0000 mL | Freq: Once | INTRAVENOUS | Status: AC
  Administered 2018-06-18: 500 mL via INTRAVENOUS

## 2018-06-18 MED ORDER — LIDOCAINE HCL (PF) 1 % IJ SOLN
30.0000 mL | INTRAMUSCULAR | Status: DC | PRN
  Filled 2018-06-18: qty 30

## 2018-06-18 MED ORDER — FUROSEMIDE 20 MG PO TABS
20.0000 mg | ORAL_TABLET | Freq: Every day | ORAL | Status: DC
  Administered 2018-06-18 – 2018-06-21 (×4): 20 mg via ORAL
  Filled 2018-06-18 (×5): qty 1

## 2018-06-18 MED ORDER — FENTANYL 2.5 MCG/ML BUPIVACAINE 1/10 % EPIDURAL INFUSION (WH - ANES)
INTRAMUSCULAR | Status: AC
  Filled 2018-06-18: qty 100

## 2018-06-18 MED ORDER — COCONUT OIL OIL
1.0000 "application " | TOPICAL_OIL | Status: DC | PRN
  Filled 2018-06-18: qty 120

## 2018-06-18 MED ORDER — POTASSIUM CHLORIDE CRYS ER 20 MEQ PO TBCR
20.0000 meq | EXTENDED_RELEASE_TABLET | Freq: Every day | ORAL | Status: DC
  Administered 2018-06-18 – 2018-06-21 (×4): 20 meq via ORAL
  Filled 2018-06-18 (×5): qty 1

## 2018-06-18 MED ORDER — TETANUS-DIPHTH-ACELL PERTUSSIS 5-2.5-18.5 LF-MCG/0.5 IM SUSP: 0.5000 mL | Freq: Once | INTRAMUSCULAR | Status: DC

## 2018-06-18 MED ORDER — WITCH HAZEL-GLYCERIN EX PADS: 1.0000 "application " | MEDICATED_PAD | CUTANEOUS | Status: DC | PRN

## 2018-06-18 MED ORDER — OXYCODONE-ACETAMINOPHEN 5-325 MG PO TABS: 1.0000 | ORAL_TABLET | ORAL | Status: DC | PRN

## 2018-06-18 MED ORDER — DIBUCAINE 1 % RE OINT: 1.0000 "application " | TOPICAL_OINTMENT | RECTAL | Status: DC | PRN

## 2018-06-18 MED ORDER — ONDANSETRON HCL 4 MG/2ML IJ SOLN: 4.0000 mg | INTRAMUSCULAR | Status: DC | PRN

## 2018-06-18 MED ORDER — SENNOSIDES-DOCUSATE SODIUM 8.6-50 MG PO TABS
2.0000 | ORAL_TABLET | ORAL | Status: DC
  Administered 2018-06-18 – 2018-06-21 (×3): 2 via ORAL
  Filled 2018-06-18 (×3): qty 2

## 2018-06-18 MED ORDER — TRANEXAMIC ACID-NACL 1000-0.7 MG/100ML-% IV SOLN
1000.0000 mg | INTRAVENOUS | Status: AC
  Administered 2018-06-18: 1000 mg via INTRAVENOUS

## 2018-06-18 MED ORDER — TRANEXAMIC ACID-NACL 1000-0.7 MG/100ML-% IV SOLN
INTRAVENOUS | Status: AC
  Administered 2018-06-18: 1000 mg via INTRAVENOUS
  Filled 2018-06-18: qty 100

## 2018-06-18 MED ORDER — ZOLPIDEM TARTRATE 5 MG PO TABS: 5.0000 mg | ORAL_TABLET | Freq: Every evening | ORAL | Status: DC | PRN

## 2018-06-18 MED ORDER — OXYTOCIN 40 UNITS IN LACTATED RINGERS INFUSION - SIMPLE MED
INTRAVENOUS | Status: AC
  Administered 2018-06-18: 500 mL via INTRAVENOUS
  Filled 2018-06-18: qty 1000

## 2018-06-18 MED ORDER — LACTATED RINGERS IV SOLN: 500.0000 mL | Freq: Once | INTRAVENOUS | Status: DC

## 2018-06-18 MED ORDER — DIPHENHYDRAMINE HCL 50 MG/ML IJ SOLN: 12.5000 mg | INTRAMUSCULAR | Status: DC | PRN

## 2018-06-18 MED ORDER — FENTANYL 2.5 MCG/ML BUPIVACAINE 1/10 % EPIDURAL INFUSION (WH - ANES)
14.0000 mL/h | INTRAMUSCULAR | Status: DC | PRN
  Administered 2018-06-18: 14 mL/h via EPIDURAL

## 2018-06-18 MED ORDER — PRENATAL MULTIVITAMIN CH
1.0000 | ORAL_TABLET | Freq: Every day | ORAL | Status: DC
  Administered 2018-06-19 – 2018-06-20 (×2): 1 via ORAL
  Filled 2018-06-18 (×2): qty 1

## 2018-06-18 NOTE — Progress Notes (Signed)
Patient ID: Rhonda Avila, female   DOB: [birth year], 28 y.o.   MRN: [id number]  Feeling well; cytotec x 3 doses; on mag sulfate, no s/s pre-e; had some bloody show with ? leakage  BP 140/96, other VSS FHR 130s, +accels, no decels Irreg mild ctx Cx FT-1/50%/ant/vtx -2; mod-lg bloody show with a half dollar sized clot on exam  IUP@34 .0wks Severe pre-e Cx unfavorable GBS pos  Cervical foley placed easily Will start PCN- unlikely she has ROM Plan Pit when foley comes out  [name] [facility] 06/18/2018 6:19 AM

## 2018-06-18 NOTE — Anesthesia Procedure Notes (Signed)
Epidural Patient location during procedure: OB Start time: 06/18/2018 10:18 AM End time: 06/18/2018 10:33 AM  Staffing Anesthesiologist: Lowella Curb, MD Performed: anesthesiologist   Preanesthetic Checklist Completed: patient identified, site marked, surgical consent, pre-op evaluation, timeout performed, IV checked, risks and benefits discussed and monitors and equipment checked  Epidural Patient position: sitting Prep: ChloraPrep Patient monitoring: heart rate, cardiac monitor, continuous pulse ox and blood pressure Approach: midline Location: L2-L3 Injection technique: LOR saline  Needle:  Needle type: Tuohy  Needle gauge: 17 G Needle length: 9 cm Needle insertion depth: 6 cm Catheter type: closed end flexible Catheter size: 20 Guage Catheter at skin depth: 10 cm Test dose: negative  Assessment Events: blood not aspirated, injection not painful, no injection resistance, negative IV test and no paresthesia  Additional Notes Reason for block:procedure for pain

## 2018-06-18 NOTE — Progress Notes (Signed)
LABOR PROGRESS NOTE  Lachrisha Melissa Noon is a 28 y.o. G1P0000 at [redacted]w[redacted]d  admitted for IOL for severe Pre-E.   Subjective: Strip note. Discussed plan of care with RN.   Objective: BP (!) 145/91   Pulse 75   Temp 98 F (36.7 C) (Oral)   Resp 18   Ht 5\' 2"  (1.575 m)   Wt 101.6 kg   LMP 10/23/2017 (Exact Date)   SpO2 100%   BMI 40.97 kg/m  or  Vitals:   06/18/18 0803 06/18/18 0830 06/18/18 0845 06/18/18 0900  BP: (!) 159/98 (!) 160/104 (!) 165/110 (!) 145/91  Pulse: 78 80 86 75  Resp: 17 18 19 18   Temp:      TempSrc:      SpO2: 100%     Weight:      Height:        Dilation: 7 Effacement (%): 90 Cervical Position: Middle Station: Plus 1, Plus 2 Presentation: Vertex Exam by:: Craige Cotta, RNC FHT: baseline rate 130, moderate varibility, +acel, no decel; difficult tracing due to maternal movement  Toco: q3 min   Labs: Lab Results  Component Value Date   WBC 10.0 06/18/2018   HGB 11.8 (L) 06/18/2018   HCT 36.1 06/18/2018   MCV 92.1 06/18/2018   PLT 183 06/18/2018    Patient Active Problem List   Diagnosis Date Noted  . Symptoms of upper respiratory infection (URI) 06/17/2018  . Severe preeclampsia, third trimester 06/16/2018  . Group B streptococcal infection in pregnancy 05/18/2018  . UTI (urinary tract infection) during pregnancy, first trimester 01/10/2018  . Supervision of high risk pregnancy, antepartum 01/03/2018  . PCOS (polycystic ovarian syndrome) 01/03/2018  . History of herpes genitalis 06/22/2017    Assessment / Plan: 28 y.o. G1P0000 at [redacted]w[redacted]d here for IOL for severe Pre-E.   Severe Pre-E: Patient asymptomatic. No severe range pressures. Patient on Mg.  Labor: Quick cervical progression after FB out. Expectant management. Will plan for AROM.  Fetal Wellbeing:  Cat I  Pain Control:  Maternal support  Anticipated MOD:  Preterm NSVD. Patient is s/p 2 doses of BMZ.   Marcy Siren, D.O. OB Fellow  06/18/2018, 9:28 AM

## 2018-06-18 NOTE — Anesthesia Preprocedure Evaluation (Signed)
Anesthesia Evaluation  Patient identified by MRN, date of birth, ID band Patient awake    Reviewed: Allergy & Precautions, NPO status , Patient's Chart, lab work & pertinent test results  Airway Mallampati: II  TM Distance: >3 FB Neck ROM: Full    Dental no notable dental hx.    Pulmonary neg pulmonary ROS,    Pulmonary exam normal breath sounds clear to auscultation       Cardiovascular hypertension, negative cardio ROS Normal cardiovascular exam Rhythm:Regular Rate:Normal     Neuro/Psych negative neurological ROS  negative psych ROS   GI/Hepatic negative GI ROS, Neg liver ROS,   Endo/Other  negative endocrine ROS  Renal/GU negative Renal ROS  negative genitourinary   Musculoskeletal negative musculoskeletal ROS (+)   Abdominal (+) + obese,   Peds negative pediatric ROS (+)  Hematology negative hematology ROS (+)   Anesthesia Other Findings   Reproductive/Obstetrics (+) Pregnancy                             Anesthesia Physical Anesthesia Plan  ASA: II  Anesthesia Plan: Epidural   Post-op Pain Management:    Induction:   PONV Risk Score and Plan:   Airway Management Planned:   Additional Equipment:   Intra-op Plan:   Post-operative Plan:   Informed Consent:   Plan Discussed with:   Anesthesia Plan Comments:         Anesthesia Quick Evaluation  

## 2018-06-18 NOTE — Progress Notes (Signed)
Patient ID: Rhonda Avila, female   DOB: [birth year], 28 y.o.   MRN: [id number]  Pt feels well; denies pain; cytotec x 2 doses; on mag sulfate  BP 156/99, P 83 FHR 130s, +accels, no decels Irreg ctx Cx at last cytotec placement was FT/thick/-3  IUP@34 .0 Severe pre-e Cx unfavorable  Continue cytotec placement during the night, and then plan to try cervical foley when able  [name] CNM 06/18/2018 12:54 AM

## 2018-06-19 LAB — CBC
HCT: 33.6 % — ABNORMAL LOW (ref 36.0–46.0)
HEMOGLOBIN: 10.9 g/dL — AB (ref 12.0–15.0)
MCH: 30 pg (ref 26.0–34.0)
MCHC: 32.4 g/dL (ref 30.0–36.0)
MCV: 92.6 fL (ref 80.0–100.0)
Platelets: 161 10*3/uL (ref 150–400)
RBC: 3.63 MIL/uL — ABNORMAL LOW (ref 3.87–5.11)
RDW: 13.8 % (ref 11.5–15.5)
WBC: 10.5 10*3/uL (ref 4.0–10.5)
nRBC: 0 % (ref 0.0–0.2)

## 2018-06-19 MED ORDER — LISINOPRIL 10 MG PO TABS
10.0000 mg | ORAL_TABLET | Freq: Two times a day (BID) | ORAL | Status: DC
  Administered 2018-06-19 – 2018-06-20 (×3): 10 mg via ORAL
  Filled 2018-06-19 (×3): qty 1

## 2018-06-19 NOTE — Progress Notes (Signed)
Post Partum Day 1 s/p SVD after IOL at 7946w1d for severe preeclampsia  Subjective: Patient denies any headaches, visual symptoms, RUQ/epigastric pain. No complaints, up ad lib, voiding and tolerating PO. Breastfeeding. Moderate lochia. Baby is stable in NICU as per patient.  Objective: Blood pressure (!) 148/94, pulse 76, temperature 97.8 F (36.6 C), temperature source Oral, resp. rate 20, height 5\' 2"  (1.575 m), weight 101.6 kg, last menstrual period 10/23/2017, SpO2 98 %.  Patient Vitals for the past 24 hrs:  BP Temp Temp src Pulse Resp SpO2  06/19/18 0600 - - - - 20 -  06/19/18 0500 - - - - 17 -  06/19/18 0410 (!) 148/94 97.8 F (36.6 C) Oral 76 18 98 %  06/19/18 0400 - - - - 18 -  06/19/18 0300 - - - - 17 -  06/19/18 0200 - - - - 20 -  06/19/18 0100 - - - - 18 -  06/19/18 0000 - - - - 18 -  06/18/18 2319 (!) 149/89 98.1 F (36.7 C) Oral 69 18 99 %  06/18/18 2300 - - - - 18 -  06/18/18 2200 - - - - 18 -  06/18/18 2100 - - - - 20 -  06/18/18 1947 (!) 148/87 98.4 F (36.9 C) Oral 62 20 99 %  06/18/18 1606 (!) 144/96 98 F (36.7 C) Oral 61 18 96 %  06/18/18 1459 (!) 152/95 97.9 F (36.6 C) Oral 73 17 99 %  06/18/18 1415 (!) 159/106 - - 68 15 -  06/18/18 1400 (!) 151/88 - - 71 16 -  06/18/18 1345 (!) 155/100 - - 81 16 -  06/18/18 1330 (!) 145/100 - - 96 17 -  06/18/18 1315 (!) 163/95 - - (!) 57 15 -  06/18/18 1300 (!) 168/104 - - 65 15 -  06/18/18 1245 (!) 149/95 - - 75 16 -  06/18/18 1230 (!) 155/103 - - 86 16 -  06/18/18 1130 (!) 158/98 - - 67 15 -  06/18/18 1100 (!) 149/94 98.5 F (36.9 C) Oral 68 16 -  06/18/18 1050 (!) 144/93 - - 75 16 -  06/18/18 1045 (!) 136/93 - - 66 15 -  06/18/18 1040 (!) 138/91 - - 67 15 98 %  06/18/18 1035 (!) 140/92 - - 73 16 99 %  06/18/18 1030 134/82 - - 85 17 99 %  06/18/18 1026 (!) 162/108 - - 78 17 100 %  06/18/18 1006 (!) 171/102 - - 76 18 -  06/18/18 0930 (!) 131/104 98.5 F (36.9 C) Oral 80 18 -  06/18/18 0900 (!) 145/91 - - 75  18 -  06/18/18 0845 (!) 165/110 - - 86 19 -  06/18/18 0830 (!) 160/104 - - 80 18 -  06/18/18 0803 (!) 159/98 - - 78 17 100 %  06/18/18 0742 (!) 157/101 98 F (36.7 C) Oral 70 17 -    Intake/Output Summary (Last 24 hours) at 06/19/2018 0647 Last data filed at 06/19/2018 0600 Gross per 24 hour  Intake 3518.84 ml  Output 5543 ml  Net -2024.16 ml    Physical Exam:  General: alert and no distress Lochia: appropriate Uterine Fundus: firm DVT Evaluation: No evidence of DVT seen on physical exam. Negative Homan's sign. No significant calf/ankle edema.  Recent Labs    06/18/18 0509 06/19/18 0547  HGB 11.8* 10.9*  HCT 36.1 33.6*    Assessment/Plan: Started on Lisinopril 10 mg bid for BP control, will be off magnesium sulfate  at 1230.  Titrate medication as needed. Encourage OOB Breastfeeding and Contraception Undecided Routine postpartum care.   LOS: 3 days   Rhonda CollinsUgonna Shavonn Convey, MD 06/19/2018, 6:45 AM

## 2018-06-19 NOTE — Lactation Note (Signed)
This note was copied from a baby's chart. Lactation Consultation Note  Patient Name: Rhonda Avila Today's Date: 06/19/2018   P1, Baby [redacted]w[redacted]d in NICU.  < 4 lbs. Mother was set up yesterday with DEBP and taught hand expression. She hand expressed drops but states she did not get to NICU to bring colostrum before 3 hours so it expired.  Provided education regarding milk storage, reviewed NICU booklet info and explained room temp 4 hours. Mother has not pumped since then.  Provided education and encouraged her to pump 2.5 -3 hours during day and 2 4 hour apart sessions during the night.  Be sure one session is between 12a-5am.   Mother has labels and colostrum containers. She seems motivated to now start pumping regularly. She states she has WIC appt on Wed this week.  Faxed WIC referral so hopefully they will visit her while here in hospital.   Mom made aware of O/P services, breastfeeding support groups, community resources, and our phone # for post-discharge questions.       Maternal Data    Feeding    LATCH Score                   Interventions    Lactation Tools Discussed/Used     Consult Status      Hardie Pulley 06/19/2018, 9:49 AM

## 2018-06-19 NOTE — Anesthesia Postprocedure Evaluation (Signed)
Anesthesia Post Note  Patient: Rhonda Avila  Procedure(s) Performed: AN AD HOC LABOR EPIDURAL     Patient location during evaluation: [facility] Unit Anesthesia Type: Epidural Level of consciousness: awake and alert Pain management: pain level controlled Vital Signs Assessment: post-procedure vital signs reviewed and stable Respiratory status: spontaneous breathing Cardiovascular status: stable Postop Assessment: no headache, patient able to bend at knees, no backache, no apparent nausea or vomiting, epidural receding, adequate PO intake and able to ambulate Anesthetic complications: no    Last Vitals:  Vitals:   06/19/18 0600 06/19/18 0700  BP:    Pulse:    Resp: 20 18  Temp:    SpO2:      Last Pain:  Vitals:   06/19/18 0700  TempSrc:   PainSc: 0-No pain   Pain Goal: Patients Stated Pain Goal: 2 (06/19/18 0600)               Salome Arnt

## 2018-06-20 ENCOUNTER — Ambulatory Visit (HOSPITAL_COMMUNITY): Admission: RE | Admit: 2018-06-20 | Payer: Self-pay | Source: Ambulatory Visit

## 2018-06-20 DIAGNOSIS — O99824 Streptococcus B carrier state complicating childbirth: Secondary | ICD-10-CM

## 2018-06-20 DIAGNOSIS — Z3A33 33 weeks gestation of pregnancy: Secondary | ICD-10-CM

## 2018-06-20 DIAGNOSIS — O1414 Severe pre-eclampsia complicating childbirth: Secondary | ICD-10-CM

## 2018-06-20 MED ORDER — LISINOPRIL 40 MG PO TABS
40.0000 mg | ORAL_TABLET | Freq: Every day | ORAL | Status: DC
  Administered 2018-06-21: 40 mg via ORAL
  Filled 2018-06-20 (×2): qty 1

## 2018-06-20 NOTE — Progress Notes (Signed)
Post Partum Day 2 Subjective: no complaints, up ad lib, voiding and tolerating PO  Objective: Blood pressure (!) 143/92, pulse 75, temperature 99.7 F (37.6 C), temperature source Oral, resp. rate 20, height 5\' 2"  (1.575 m), weight 101.6 kg, last menstrual period 10/23/2017, SpO2 98 %. Today's Vitals   06/20/18 1030 06/20/18 1130 06/20/18 1231 06/20/18 1541  BP: 139/84  (!) 141/90 (!) 143/92  Pulse: (!) 57  69 75  Resp: 20  20 20   Temp: 98.6 F (37 C)  98.7 F (37.1 C) 99.7 F (37.6 C)  TempSrc: Oral  Oral Oral  SpO2: 100%  100% 98%  Weight:      Height:      PainSc:  0-No pain     Body mass index is 40.97 kg/m.  Physical Exam:  General: alert, cooperative and appears stated age Lochia: appropriate Uterine Fundus: firm DVT Evaluation: Negative Homan's sign.  Recent Labs    06/18/18 0509 06/19/18 0547  HGB 11.8* 10.9*  HCT 36.1 33.6*    Assessment/Plan: Plan for discharge tomorrow, Breastfeeding, Lactation consult and Contraception condoms  Increase Enalapril today. If BP ok, for dc in am.   LOS: 4 days   Reva Bores 06/20/2018, 4:04 PM

## 2018-06-21 MED ORDER — LISINOPRIL 40 MG PO TABS: 40.0000 mg | ORAL_TABLET | Freq: Every day | ORAL | 0 refills | Status: DC

## 2018-06-21 MED ORDER — IBUPROFEN 600 MG PO TABS: 600.0000 mg | ORAL_TABLET | Freq: Four times a day (QID) | ORAL | 0 refills | Status: DC

## 2018-06-21 NOTE — Discharge Instructions (Signed)
Postpartum Care After Vaginal Delivery °This sheet gives you information about how to care for yourself from the time you deliver your baby to up to 6-12 weeks after delivery (postpartum period). Your health care provider may also give you more specific instructions. If you have problems or questions, contact your health care provider. °Follow these instructions at home: °Vaginal bleeding °· It is normal to have vaginal bleeding (lochia) after delivery. Wear a sanitary pad for vaginal bleeding and discharge. °? During the first week after delivery, the amount and appearance of lochia is often similar to a menstrual period. °? Over the next few weeks, it will gradually decrease to a dry, yellow-brown discharge. °? For most women, lochia stops completely by 4-6 weeks after delivery. Vaginal bleeding can vary from woman to woman. °· Change your sanitary pads frequently. Watch for any changes in your flow, such as: °? A sudden increase in volume. °? A change in color. °? Large blood clots. °· If you pass a blood clot from your vagina, save it and call your health care provider to discuss. Do not flush blood clots down the toilet before talking with your health care provider. °· Do not use tampons or douches until your health care provider says this is safe. °· If you are not breastfeeding, your period should return 6-8 weeks after delivery. If you are feeding your child breast milk only (exclusive breastfeeding), your period may not return until you stop breastfeeding. °Perineal care °· Keep the area between the vagina and the anus (perineum) clean and dry as told by your health care provider. Use medicated pads and pain-relieving sprays and creams as directed. °· If you had a cut in the perineum (episiotomy) or a tear in the vagina, check the area for signs of infection until you are healed. Check for: °? More redness, swelling, or pain. °? Fluid or blood coming from the cut or tear. °? Warmth. °? Pus or a bad  smell. °· You may be given a squirt bottle to use instead of wiping to clean the perineum area after you go to the bathroom. As you start healing, you may use the squirt bottle before wiping yourself. Make sure to wipe gently. °· To relieve pain caused by an episiotomy, a tear in the vagina, or swollen veins in the anus (hemorrhoids), try taking a warm sitz bath 2-3 times a day. A sitz bath is a warm water bath that is taken while you are sitting down. The water should only come up to your hips and should cover your buttocks. °Breast care °· Within the first few days after delivery, your breasts may feel heavy, full, and uncomfortable (breast engorgement). Milk may also leak from your breasts. Your health care provider can suggest ways to help relieve the discomfort. Breast engorgement should go away within a few days. °· If you are breastfeeding: °? Wear a bra that supports your breasts and fits you well. °? Keep your nipples clean and dry. Apply creams and ointments as told by your health care provider. °? You may need to use breast pads to absorb milk that leaks from your breasts. °? You may have uterine contractions every time you breastfeed for up to several weeks after delivery. Uterine contractions help your uterus return to its normal size. °? If you have any problems with breastfeeding, work with your health care provider or lactation consultant. °· If you are not breastfeeding: °? Avoid touching your breasts a lot. Doing this can make   your breasts produce more milk. °? Wear a good-fitting bra and use cold packs to help with swelling. °? Do not squeeze out (express) milk. This causes you to make more milk. °Intimacy and sexuality °· Ask your health care provider when you can engage in sexual activity. This may depend on: °? Your risk of infection. °? How fast you are healing. °? Your comfort and desire to engage in sexual activity. °· You are able to get pregnant after delivery, even if you have not had  your period. If desired, talk with your health care provider about methods of birth control (contraception). °Medicines °· Take over-the-counter and prescription medicines only as told by your health care provider. °· If you were prescribed an antibiotic medicine, take it as told by your health care provider. Do not stop taking the antibiotic even if you start to feel better. °Activity °· Gradually return to your normal activities as told by your health care provider. Ask your health care provider what activities are safe for you. °· Rest as much as possible. Try to rest or take a nap while your baby is sleeping. °Eating and drinking ° °· Drink enough fluid to keep your urine pale yellow. °· Eat high-fiber foods every day. These may help prevent or relieve constipation. High-fiber foods include: °? Whole grain cereals and breads. °? Brown rice. °? Beans. °? Fresh fruits and vegetables. °· Do not try to lose weight quickly by cutting back on calories. °· Take your prenatal vitamins until your postpartum checkup or until your health care provider tells you it is okay to stop. °Lifestyle °· Do not use any products that contain nicotine or tobacco, such as cigarettes and e-cigarettes. If you need help quitting, ask your health care provider. °· Do not drink alcohol, especially if you are breastfeeding. °General instructions °· Keep all follow-up visits for you and your baby as told by your health care provider. Most women visit their health care provider for a postpartum checkup within the first 3-6 weeks after delivery. °Contact a health care provider if: °· You feel unable to cope with the changes that your child brings to your life, and these feelings do not go away. °· You feel unusually sad or worried. °· Your breasts become red, painful, or hard. °· You have a fever. °· You have trouble holding urine or keeping urine from leaking. °· You have little or no interest in activities you used to enjoy. °· You have not  breastfed at all and you have not had a menstrual period for 12 weeks after delivery. °· You have stopped breastfeeding and you have not had a menstrual period for 12 weeks after you stopped breastfeeding. °· You have questions about caring for yourself or your baby. °· You pass a blood clot from your vagina. °Get help right away if: °· You have chest pain. °· You have difficulty breathing. °· You have sudden, severe leg pain. °· You have severe pain or cramping in your lower abdomen. °· You bleed from your vagina so much that you fill more than one sanitary pad in one hour. Bleeding should not be heavier than your heaviest period. °· You develop a severe headache. °· You faint. °· You have blurred vision or spots in your vision. °· You have bad-smelling vaginal discharge. °· You have thoughts about hurting yourself or your baby. °If you ever feel like you may hurt yourself or others, or have thoughts about taking your own life, get help   right away. You can go to the nearest emergency department or call: °· Your local emergency services (911 in the [place] °· A suicide crisis helpline, such as the National Suicide Prevention Lifeline at 1-800-273-8255. This is open 24 hours a day. °Summary °· The period of time right after you deliver your newborn up to 6-12 weeks after delivery is called the postpartum period. °· Gradually return to your normal activities as told by your health care provider. °· Keep all follow-up visits for you and your baby as told by your health care provider. °This information is not intended to replace advice given to you by your health care provider. Make sure you discuss any questions you have with your health care provider. °Document Released: 03/29/2007 Document Revised: 03/15/2017 Document Reviewed: 03/15/2017 °Elsevier Interactive Patient Education © 2019 [employer] °Postpartum Hypertension °Postpartum hypertension is high blood pressure that remains higher than normal after  childbirth. You may not realize that you have postpartum hypertension if your blood pressure is not being checked regularly. In most cases, postpartum hypertension will go away on its own, usually within a week of delivery. However, for some women, medical treatment is required to prevent serious complications, such as seizures or stroke. °What are the causes? °This condition may be caused by one or more of the following: °· Hypertension that existed before pregnancy (chronic hypertension). °· Hypertension that comes on as a result of pregnancy (gestational hypertension). °· Hypertensive disorders during pregnancy (preeclampsia) or seizures in women who have high blood pressure during pregnancy (eclampsia). °· A condition in which the liver, platelets, and red blood cells are damaged during pregnancy (HELLP syndrome). °· A condition in which the thyroid produces too much hormones (hyperthyroidism). °· Other rare problems of the nerves (neurological disorders) or blood disorders. °In some cases, the cause may not be known. °What increases the risk? °The following factors may make you more likely to develop this condition: °· Chronic hypertension. In some cases, this may not have been diagnosed before pregnancy. °· Obesity. °· Type 2 diabetes. °· Kidney disease. °· History of preeclampsia or eclampsia. °· Other medical conditions that change the level of hormones in the body (hormonal imbalance). °What are the signs or symptoms? °As with all types of hypertension, postpartum hypertension may not have any symptoms. Depending on how high your blood pressure is, you may experience: °· Headaches. These may be mild, moderate, or severe. They may also be steady, constant, or sudden in onset (thunderclap headache). °· Changes in your ability to see (visual changes). °· Dizziness. °· Shortness of breath. °· Swelling of your hands, feet, lower legs, or face. In some cases, you may have swelling in more than one of these  locations. °· Heart palpitations or a racing heartbeat. °· Difficulty breathing while lying down. °· Decrease in the amount of urine that you pass. °Other rare signs and symptoms may include: °· Sweating more than usual. This lasts longer than a few days after delivery. °· Chest pain. °· Sudden dizziness when you get up from sitting or lying down. °· Seizures. °· Nausea or vomiting. °· Abdominal pain. °How is this diagnosed? °This condition may be diagnosed based on the results of a physical exam, blood pressure measurements, and blood and urine tests. °You may also have other tests, such as a CT scan or an MRI, to check for other problems of postpartum hypertension. °How is this treated? °If blood pressure is high enough to require treatment, your options may include: °· Medicines   to reduce blood pressure (antihypertensives). Tell your health care provider if you are breastfeeding or if you plan to breastfeed. There are many antihypertensive medicines that are safe to take while breastfeeding. °· Stopping medicines that may be causing hypertension. °· Treating medical conditions that are causing hypertension. °· Treating the complications of hypertension, such as seizures, stroke, or kidney problems. °Your health care provider will also continue to monitor your blood pressure closely until it is within a safe range for you. °Follow these instructions at home: °· Take over-the-counter and prescription medicines only as told by your health care provider. °· Return to your normal activities as told by your health care provider. Ask your health care provider what activities are safe for you. °· Do not use any products that contain nicotine or tobacco, such as cigarettes and e-cigarettes. If you need help quitting, ask your health care provider. °· Keep all follow-up visits as told by your health care provider. This is important. °Contact a health care provider if: °· Your symptoms get worse. °· You have new symptoms,  such as: °? A headache that does not get better. °? Dizziness. °? Visual changes. °Get help right away if: °· You suddenly develop swelling in your hands, ankles, or face. °· You have sudden, rapid weight gain. °· You develop difficulty breathing, chest pain, racing heartbeat, or heart palpitations. °· You develop severe pain in your abdomen. °· You have any symptoms of a stroke. "BE FAST" is an easy way to remember the main warning signs of a stroke: °? B - Balance. Signs are dizziness, sudden trouble walking, or loss of balance. °? E - Eyes. Signs are trouble seeing or a sudden change in vision. °? F - Face. Signs are sudden weakness or numbness of the face, or the face or eyelid drooping on one side. °? A - Arms. Signs are weakness or numbness in an arm. This happens suddenly and usually on one side of the body. °? S - Speech. Signs are sudden trouble speaking, slurred speech, or trouble understanding what people say. °? T - Time. Time to call emergency services. Write down what time symptoms started. °· You have other signs of a stroke, such as: °? A sudden, severe headache with no known cause. °? Nausea or vomiting. °? Seizure. °These symptoms may represent a serious problem that is an emergency. Do not wait to see if the symptoms will go away. Get medical help right away. Call your local emergency services (911 in the [place] Do not drive yourself to the hospital. °Summary °· Postpartum hypertension is high blood pressure that remains higher than normal after childbirth. °· In most cases, postpartum hypertension will go away on its own, usually within a week of delivery. °· For some women, medical treatment is required to prevent serious complications, such as seizures or stroke. °This information is not intended to replace advice given to you by your health care provider. Make sure you discuss any questions you have with your health care provider. °Document Released: 02/02/2014 Document Revised: 03/22/2017  Document Reviewed: 03/22/2017 °Elsevier Interactive Patient Education © 2019 Elsevier Inc. ° °

## 2018-06-21 NOTE — Discharge Summary (Signed)
OB Discharge Summary  Patient Name: Rhonda Avila DOB: [birth year] MRN: [id number]  Date of admission: 06/16/2018 Delivering MD: Edd Arbour R   Date of discharge: 06/21/2018  Admitting diagnosis: 33WKS HBP, PAIN FROM KNEE DOWN TO BOTTOM OF LEG Intrauterine pregnancy: [redacted]w[redacted]d     Secondary diagnosis:Principal Problem:   Severe preeclampsia, third trimester Active Problems:   History of herpes genitalis   Supervision of high risk pregnancy, antepartum   Symptoms of upper respiratory infection (URI)  Additional problems: Patient Active Problem List   Diagnosis Date Noted  . Symptoms of upper respiratory infection (URI) 06/17/2018  . Severe preeclampsia, third trimester 06/16/2018  . Group B streptococcal infection in pregnancy 05/18/2018  . UTI (urinary tract infection) during pregnancy, first trimester 01/10/2018  . Supervision of high risk pregnancy, antepartum 01/03/2018  . PCOS (polycystic ovarian syndrome) 01/03/2018  . History of herpes genitalis 06/22/2017        Discharge diagnosis: Preterm Pregnancy Delivered and Preeclampsia (severe)                                                                     Post partum procedures:Magnesium Sulfate  Augmentation: AROM, Cytotec and Foley Balloon  Complications: None  Hospital course:  Induction of Labor With Vaginal Delivery   28 y.o. yo G1P0000 at [redacted]w[redacted]d was admitted to the hospital 06/16/2018 for severe pre-eclampsia at 33 5/7 wks. Started on Magnesium and watched for 2 days. Moved down for induction of labor at 34 wks.  Indication for induction: Preeclampsia.  Patient had an uncomplicated labor course as follows Cytotec, followed by foley balloon and AROM: Membrane Rupture Time/Date: 5:11 AM ,06/18/2018   Intrapartum Procedures: Episiotomy: None [1]                                         Lacerations:  None [1]  Patient had delivery of a Viable infant.  Information for the patient's newborn:  Bettyjean, Swilling [[id number]]  Delivery Method: Vaginal, Spontaneous(Filed from Delivery Summary)   06/18/2018  Details of delivery can be found in separate delivery note.  Patient had a routine postpartum course. Patient is discharged home 06/21/18.  Physical exam  Vitals:   06/20/18 1946 06/20/18 2344 06/21/18 0333 06/21/18 0843  BP: 138/87 130/77 138/82 (!) 159/98  Pulse: 72 68 78 68  Resp: 18 18 18 18   Temp: 98.9 F (37.2 C) 98 F (36.7 C) 98.2 F (36.8 C) 98.2 F (36.8 C)  TempSrc: Oral Oral Oral Oral  SpO2: 100% 99% 100% 100%  Weight:      Height:       General: alert, cooperative and no distress Lochia: appropriate Uterine Fundus: firm DVT Evaluation: No evidence of DVT seen on physical exam. Labs: Lab Results  Component Value Date   WBC 10.5 06/19/2018   HGB 10.9 (L) 06/19/2018   HCT 33.6 (L) 06/19/2018   MCV 92.6 06/19/2018   PLT 161 06/19/2018   CMP Latest Ref Rng & Units 06/18/2018  Glucose 70 - 99 mg/dL 80  BUN 6 - 20 mg/dL 11  Creatinine 1.43 - 8.88 mg/dL 7.57  Sodium 135 - 145 mmol/L 134(L)  Potassium 3.5 - 5.1 mmol/L 4.4  Chloride 98 - 111 mmol/L 103  CO2 22 - 32 mmol/L 23  Calcium 8.9 - 10.3 mg/dL 7.5(L)  Total Protein 6.5 - 8.1 g/dL 8.4(T)  Total Bilirubin 0.3 - 1.2 mg/dL 3.6(I)  Alkaline Phos 38 - 126 U/L 116  AST 15 - 41 U/L 29  ALT 0 - 44 U/L 19    Discharge instruction: per After Visit Summary and "Baby and Me Booklet".  After Visit Meds:  Allergies as of 06/21/2018   No Known Allergies     Medication List    TAKE these medications   ibuprofen 600 MG tablet Commonly known as:  ADVIL,MOTRIN Take 1 tablet (600 mg total) by mouth every 6 (six) hours.   lisinopril 40 MG tablet Commonly known as:  PRINIVIL,ZESTRIL Take 1 tablet (40 mg total) by mouth daily. Start taking on:  June 22, 2018   prenatal multivitamin Tabs tablet Take 1 tablet by mouth daily at 12 noon.   valACYclovir 1000 MG tablet Commonly known as:  VALTREX Take  1 tablet (1,000 mg total) by mouth daily.       Diet: routine diet  Activity: Advance as tolerated. Pelvic rest for 6 weeks.   Outpatient follow up:2 weeks Follow up Appt: Future Appointments  Date Time Provider Department Center  06/27/2018  2:30 PM [facility] NURSE [facility]  07/14/2018  8:30 AM [facility] NURSE [facility]  07/14/2018  9:15 AM Allie Bossier, MD [facility]   Follow up visit: No follow-ups on file.  Postpartum contraception: None  Newborn Data: Live born female  Birth Weight: 3 lb 9.9 oz (1640 g) APGAR: 8, 8  Newborn Delivery   Birth date/time:  06/18/2018 12:24:00 Delivery type:  Vaginal, Spontaneous     Baby Feeding: Breast Disposition:NICU   06/21/2018 Reva Bores, MD

## 2018-06-21 NOTE — Lactation Note (Signed)
This note was copied from a baby's chart. Lactation Consultation Note: Follow up visit with this mom of NICU baby born at 87 w 3 d. Mom reports she pumped 3 times yesterday and did obtain a little more milk than the day before. Is drinking a remedy from [place] that is supposed to increase milk supply. Does not know ingredients. Plans to get pump from [facility] for home. Encouraged to pump 8 times/day to promote a good milk supply. Reviewed taking home all pump pieces and can bring them when she visits baby and pump while here. Has put the baby to the breast a couple of times- reports he did latch and suck some through not for very long. Encouragement given. No questions at present. Reviewed our phone number to call with questions/concerns. To call if wants assist with latch in NICU.   Patient Name: Rhonda Avila [id number] Date: 06/21/2018 Reason for consult: Follow-up assessment;Late-preterm 34-36.6wks   Maternal Data Formula Feeding for Exclusion: Yes Reason for exclusion: Admission to Intensive Care Unit (ICU) post-partum Has patient been taught Hand Expression?: Yes Does the patient have breastfeeding experience prior to this delivery?: No  Feeding Feeding Type: Donor Breast Milk  LATCH Score                   Interventions    Lactation Tools Discussed/Used WIC Program: Yes   Consult Status Consult Status: Complete    Pamelia Hoit 06/21/2018, 9:15 AM

## 2018-06-21 NOTE — Progress Notes (Signed)
Discharge instructions given. Questions answered. Pt states understanding. Given copy of instructions at this time

## 2018-06-24 ENCOUNTER — Ambulatory Visit: Payer: Self-pay

## 2018-06-24 NOTE — Lactation Note (Addendum)
This note was copied from a baby's chart. Lactation Consultation Note  Patient Name: Rhonda Avila [id number] Date: 06/24/2018 Reason for consult: Follow-up assessment;Late-preterm 34-36.6wks;Primapara;1st time breastfeeding;Difficult latch;NICU baby  Visited with mom of a 14 day old LPI NICU female at the pod; she called for BF assistance, she's been trying to latch baby on since yesterday. Baby is still at 34 weeks window and not ready to take the breast at this point. He kept coming off the breast when LC assisted with latch in cross cradle position, no audible swallows noted. All he's doing are attempts, or like LC explained to mom, it's "practice at the breast" mom understands that his main source of nutrition at this point is coming through his NG tube. Mom didn't bring her pump kit to the hospital today, but she pumped before she left home, mom confident that she didn't need the kit because baby is now "breastfeding".   Explained to mom that baby is not able to empty the breast at this point, and that all he's doing is just getting "drops" when we hand express when she has baby at the breast. Encouraged her to keep pumping every 3 hours to protect her supply, per mom she's getting about 7 ounces of breastmilk per pumping session, praised her for her efforts. Discussed normal LPI behavior, mom understands that baby has a learning curve before he can catch up with his FT "peers". GOB present and supportive.  Baby hasn't got any bottles yet, discussed with RN the possibility of starting bottles with breastmilk in order to get baby to suck, he'll suck on a gloved finger but not at the breast even though mom's nipples are very everted, almost like a bottle nipple.   Feeding plan:  1. Encouraged mom to keep pumping every 3 hours and least once at night, a minimum of 8 pumping sessions in 24 hours 2. Mom will continue bringing her EBM to the NICU 3. Mom will keep taking baby to the breast  PRN; discussed the possibility of staring a NS in the next week or two if he's still having difficulties latching on  Mom reported all questions and concerns were answered, she's aware of [place] services and will call PRN.  Maternal Data    Feeding Feeding Type: Breast Milk  LATCH Score Latch: Repeated attempts needed to sustain latch, nipple held in mouth throughout feeding, stimulation needed to elicit sucking reflex.  Audible Swallowing: None  Type of Nipple: Everted at rest and after stimulation  Comfort (Breast/Nipple): Soft / non-tender  Hold (Positioning): Assistance needed to correctly position infant at breast and maintain latch.  LATCH Score: 6  Interventions Interventions: Breast feeding basics reviewed;Assisted with latch;Skin to skin;Breast massage;Hand express;Breast compression;Adjust position  Lactation Tools Discussed/Used     Consult Status Consult Status: PRN Follow-up type: In-patient    Jenesis Suchy Francene Boyers 06/24/2018, 12:42 PM

## 2018-06-27 ENCOUNTER — Ambulatory Visit (INDEPENDENT_AMBULATORY_CARE_PROVIDER_SITE_OTHER): Payer: Self-pay

## 2018-06-27 VITALS — BP 123/80 | HR 61 | Ht 62.0 in | Wt 199.6 lb

## 2018-06-27 DIAGNOSIS — Z0130 Encounter for examination of blood pressure without abnormal findings: Secondary | ICD-10-CM

## 2018-06-27 NOTE — Progress Notes (Signed)
Pt here for BP CHECK, BP was 123/80, pt currently taking Lisinopril. Reviewed with Dr. Micki Riley everything looks good..continue medicine. Pt also states has been constipated, Pt is breastfeeding. Per Dr. Adrian Blackwater pt can take Miralax or Colace. Pt verbalized understanding.Marland Kitchen

## 2018-06-28 NOTE — Progress Notes (Signed)
Chart reviewed - agree with RN documentation.   

## 2018-07-14 ENCOUNTER — Ambulatory Visit: Payer: Self-pay

## 2018-07-14 ENCOUNTER — Ambulatory Visit: Payer: Self-pay | Admitting: Advanced Practice Midwife

## 2018-07-20 ENCOUNTER — Ambulatory Visit (INDEPENDENT_AMBULATORY_CARE_PROVIDER_SITE_OTHER): Payer: Self-pay | Admitting: Nurse Practitioner

## 2018-07-20 DIAGNOSIS — Z6836 Body mass index (BMI) 36.0-36.9, adult: Secondary | ICD-10-CM

## 2018-07-20 DIAGNOSIS — K5909 Other constipation: Secondary | ICD-10-CM

## 2018-07-20 DIAGNOSIS — I10 Essential (primary) hypertension: Secondary | ICD-10-CM

## 2018-07-20 MED ORDER — NORETHINDRONE 0.35 MG PO TABS: 1.0000 | ORAL_TABLET | Freq: Every day | ORAL | 11 refills | Status: DC

## 2018-07-20 MED ORDER — LISINOPRIL 40 MG PO TABS: 40.0000 mg | ORAL_TABLET | Freq: Every day | ORAL | 0 refills | Status: DC

## 2018-07-20 NOTE — Patient Instructions (Addendum)
See a primary care doctor to follow your hypertension Recommend weight loss. Drink at least 8 8-oz glasses of water every day. Eat a high fiber diet - decrease the cheese and tortillias that you eat You can use Miralax every other day for 2 months or take Colace by the package directions (over the counter)   Constipation, Adult Constipation is when a person:  Poops (has a bowel movement) fewer times in a week than normal.  Has a hard time pooping.  Has poop that is dry, hard, or bigger than normal. Follow these instructions at home: Eating and drinking   Eat foods that have a lot of fiber, such as: ? Fresh fruits and vegetables. ? Whole grains. ? Beans.  Eat less of foods that are high in fat, low in fiber, or overly processed, such as: ? [place] fries. ? Hamburgers. ? Cookies. ? Candy. ? Soda.  Drink enough fluid to keep your pee (urine) clear or pale yellow. General instructions  Exercise regularly or as told by your doctor.  Go to the restroom when you feel like you need to poop. Do not hold it in.  Take over-the-counter and prescription medicines only as told by your doctor. These include any fiber supplements.  Do pelvic floor retraining exercises, such as: ? Doing deep breathing while relaxing your lower belly (abdomen). ? Relaxing your pelvic floor while pooping.  Watch your condition for any changes.  Keep all follow-up visits as told by your doctor. This is important. Contact a doctor if:  You have pain that gets worse.  You have a fever.  You have not pooped for 4 days.  You throw up (vomit).  You are not hungry.  You lose weight.  You are bleeding from the anus.  You have thin, pencil-like poop (stool). Get help right away if:  You have a fever, and your symptoms suddenly get worse.  You leak poop or have blood in your poop.  Your belly feels hard or bigger than normal (is bloated).  You have very bad belly pain.  You feel dizzy or  you faint. This information is not intended to replace advice given to you by your health care provider. Make sure you discuss any questions you have with your health care provider. Document Released: 11/18/2007 Document Revised: 12/20/2015 Document Reviewed: 11/20/2015 Elsevier Interactive Patient Education  2019 [employer].

## 2018-07-20 NOTE — Progress Notes (Signed)
Subjective:     Rhonda Avila is a 28 y.o. female who presents for a postpartum visit. She is 5 weeks postpartum following a spontaneous vaginal delivery. I have fully reviewed the prenatal and intrapartum course. The delivery was at 34 gestational weeks. Outcome: spontaneous vaginal delivery. Anesthesia: epidural. Postpartum course has been uneventful. Baby's course has been uneventful. Baby is feeding by both breast and bottle - Similac Neosure. Bleeding no bleeding. Bowel function is abnormal: constipated. Bladder function is normal. Patient is not sexually active. Contraception method is none. Has not had intercourse.  Postpartum depression screening: negative.  The following portions of the patient's history were reviewed and updated as appropriate: allergies, current medications, past family history, past medical history, past social history, past surgical history and problem list.  Review of Systems Pertinent items noted in HPI and remainder of comprehensive ROS otherwise negative.   Objective:    LMP 10/23/2017 (Exact Date)   General:  alert, cooperative and no distress   Breasts:  not examined  Lungs: clear to auscultation bilaterally  Heart:  regular rate and rhythm, S1, S2 normal, no murmur, click, rub or gallop  Abdomen: not examined   Vulva:  not evaluated  Vagina: not evaluated  Cervix:  not evaluated  Corpus: not examined  Adnexa:  not evaluated  Rectal Exam: Not performed.        Assessment:    Normal postpartum exam. Pap smear not done at today's visit.   Plan:    1. Contraception: oral progesterone-only contraceptive.  Advised she will need another type of pill once she is no longer breastfeeding.  Advised no pregnancy for 18 months. 2. Follow up with PCP about hypertension.  Currently continuing medication as BP is stable while on medication.  Refilled for 30 days only.  Reviewed importance of keeping BMI under 25 and keeping BP well controlled as  she is at risk of early cardiac disease due to preterm birth with preeclampsia. 3. Follow up in: 12 months or as needed.    Nolene Bernheim, RN, MSN, NP-BC Nurse Practitioner, [facility] for [employer], [facility] 07/20/2018 7:55 PM

## 2018-08-08 ENCOUNTER — Ambulatory Visit: Payer: Self-pay | Admitting: Family Medicine

## 2018-08-11 ENCOUNTER — Ambulatory Visit: Payer: Self-pay | Attending: Family Medicine | Admitting: Physician Assistant

## 2018-08-11 VITALS — BP 126/80 | HR 86 | Temp 97.6°F | Ht 62.0 in | Wt 202.4 lb

## 2018-08-11 DIAGNOSIS — I10 Essential (primary) hypertension: Secondary | ICD-10-CM

## 2018-08-11 MED ORDER — NORETHINDRONE 0.35 MG PO TABS: 1.0000 | ORAL_TABLET | Freq: Every day | ORAL | 11 refills | Status: DC

## 2018-08-11 MED ORDER — LISINOPRIL 20 MG PO TABS: 20.0000 mg | ORAL_TABLET | Freq: Every day | ORAL | 3 refills | Status: DC

## 2018-08-11 MED FILL — NORETHINDRONE 0.35 MG TAB: 0.35 | 28 days supply | Qty: 28 | Fill #0

## 2018-08-11 MED FILL — LISINOPRIL 20 MG TAB: 20 | 30 days supply | Qty: 30 | Fill #0

## 2018-08-11 NOTE — Patient Instructions (Signed)
Check blood pressure once daily and record.  Bring numbers to next visit.

## 2018-08-11 NOTE — Progress Notes (Signed)
Patient ID: Rhonda Avila, female   DOB: 12-Aug-1990, 28 y.o.   MRN: [id number]   Rhonda Avila, is a 28 y.o. female  [id number]  [id number]  DOB - Dec 01, 1990  Subjective:  Chief Complaint and HPI: Rhonda Avila is a 28 y.o. female here today to establish care and for a follow up visit after being started on BP meds just after delivering her baby about 7 weeks ago.  She was started on Lisinopril 40mg  daily.  Bp has been normal.  She is nursing.  She never had BP issues prior to the end of pregnancy.  Denies HA/CP/SOB.  She has a BP cuff at home.  She is on Micronor for [facility] and has intermittent spotting.    ED/Hospital notes reviewed.    ROS:   Constitutional:  No f/c, No night sweats, No unexplained weight loss. EENT:  No vision changes, No blurry vision, No hearing changes. No mouth, throat, or ear problems.  Respiratory: No cough, No SOB Cardiac: No CP, no palpitations GI:  No abd pain, No N/V/D. GU: No Urinary s/sx Musculoskeletal: No joint pain Neuro: No headache, no dizziness, no motor weakness.  Skin: No rash Endocrine:  No polydipsia. No polyuria.  Psych: Denies SI/HI  No problems updated.  ALLERGIES: No Known Allergies  PAST MEDICAL HISTORY: Past Medical History:  Diagnosis Date  . HSV (herpes simplex virus) infection   . Medical history non-contributory   . Pregnancy induced hypertension     MEDICATIONS AT HOME: Prior to Admission medications   Medication Sig Start Date End Date Taking? Authorizing Provider  norethindrone (MICRONOR,CAMILA,ERRIN) 0.35 MG tablet Take 1 tablet (0.35 mg total) by mouth daily. 08/11/18  Yes Anders Simmonds, PA-C  Prenatal Vit-Fe Fumarate-FA (PRENATAL MULTIVITAMIN) TABS tablet Take 1 tablet by mouth daily at 12 noon.   Yes [provider]  ibuprofen (ADVIL,MOTRIN) 600 MG tablet Take 1 tablet (600 mg total) by mouth every 6 (six) hours. Patient not taking: Reported on 07/20/2018 06/21/18    Reva Bores, MD  lisinopril (PRINIVIL,ZESTRIL) 20 MG tablet Take 1 tablet (20 mg total) by mouth daily. 08/11/18   Anders Simmonds, PA-C  valACYclovir (VALTREX) 1000 MG tablet Take 1 tablet (1,000 mg total) by mouth daily. Patient not taking: Reported on 06/27/2018 06/09/18   [facility], MD     Objective:  EXAM:   Vitals:   08/11/18 1334  BP: 126/80  Pulse: 86  SpO2: 98%  Weight: 202 lb 6.4 oz (91.8 kg)  Height: 5\' 2"  (1.575 m)    General appearance : A&OX3. NAD. Non-toxic-appearing HEENT: Atraumatic and Normocephalic.  PERRLA. EOM intact.  TM clear B. Mouth-MMM, post pharynx WNL w/o erythema, No PND. Neck: supple, no JVD. No cervical lymphadenopathy. No thyromegaly Chest/Lungs:  Breathing-non-labored, Good air entry bilaterally, breath sounds normal without rales, rhonchi, or wheezing  CVS: S1 S2 regular, no murmurs, gallops, rubs  Abdomen: Bowel sounds present, Non tender and not distended with no gaurding, rigidity or rebound. Extremities: Bilateral Lower Ext shows no edema, both legs are warm to touch with = pulse throughout Neurology:  CN II-XII grossly intact, Non focal.   Psych:  TP linear. J/I WNL. Normal speech. Appropriate eye contact and affect.  Skin:  No Rash  Data Review Lab Results  Component Value Date   HGBA1C 4.9 01/03/2018     Assessment & Plan   1. Hypertension, unspecified type Diagnosed in pregnancy.  She is 7 weeks post partum.  We  can try and decrease dose from 40mg  to 20mg .  Check BP daily and record and bring to next visit.  Continue with weight loss/decrease sodium and sugar intake.  May be able to come off meds all together depending on how she does as we titrate down and she continues with healthy lifestyle.   - lisinopril (PRINIVIL,ZESTRIL) 20 MG tablet; Take 1 tablet (20 mg total) by mouth daily.  Dispense: 90 tablet; Refill: 3 We have discussed target BP range and blood pressure goal. I have advised patient to check BP regularly  and to call us back or report to clinic if the numbers are consistently higher than 140/90. We discussed the importance of compliance with medical therapy and DASH diet recommended, consequences of uncontrolled hypertension discussed.    Patient have been counseled extensively about nutrition and exercise  Return in about 1 month (around 09/09/2018) for BP f/up and assign PCP.  The patient was given clear instructions to go to ER or return to medical center if symptoms don't improve, worsen or new problems develop. The patient verbalized understanding. The patient was told to call to get lab results if they haven't heard anything in the next week.     Georgian Co, PA-C [facility] and [facility] [place], [place] [phone]   08/11/2018, 1:51 PM

## 2018-09-09 MED FILL — NORETHINDRONE 0.35 MG TAB: 0.35 | 84 days supply | Qty: 84 | Fill #1

## 2018-09-26 ENCOUNTER — Ambulatory Visit: Payer: Self-pay | Admitting: Nurse Practitioner

## 2018-10-04 ENCOUNTER — Ambulatory Visit: Payer: Self-pay | Attending: Nurse Practitioner | Admitting: Nurse Practitioner

## 2018-10-04 ENCOUNTER — Encounter: Payer: Self-pay | Admitting: Nurse Practitioner

## 2018-10-04 ENCOUNTER — Other Ambulatory Visit: Payer: Self-pay

## 2018-10-04 DIAGNOSIS — I10 Essential (primary) hypertension: Secondary | ICD-10-CM

## 2018-10-04 DIAGNOSIS — R5383 Other fatigue: Secondary | ICD-10-CM

## 2018-10-04 DIAGNOSIS — E871 Hypo-osmolality and hyponatremia: Secondary | ICD-10-CM

## 2018-10-04 NOTE — Progress Notes (Signed)
Virtual Visit via Telephone Note  I connected with Rhonda Avila on 10/04/18  at   2:50 PM EDT  EDT by telephone and verified that I am speaking with the correct person using two identifiers.   Consent I discussed the limitations, risks, security and privacy concerns of performing an evaluation and management service by telephone and the availability of in person appointments. I also discussed with the patient that there may be a patient responsible charge related to this service. The patient expressed understanding and agreed to proceed.   Location of Patient: Patient's home   Location of Provider: [facility]    Persons participating in Telemedicine visit: Bertram Denver FNP-BC [place] CMA Delphia Pura Spice [place] Slider    History of Present Illness: Telemedicine to establish care. She has a history of 3rd trimester pre eclampsia and was started on lisinopril 40 mg 3 months ago.  She was titrated down from 40mg  to 20 mg 2 months ago.  She stopped taking lisinopril 3 weeks ago.  Monitoring blood pressure several times per week at home with Average 120/70-80s. Will continue off lisinopril at this time. She was instructed to notify the office if she notices consistent readings >140/90.  She also has a history of anemia. Endorses fatigue. Denies fever. Currently taking OCPs for birth control and has been experiencing metrorrhagia over the past few months. As she has  just started the OCPs 2 months ago I have instructed her to wait one additional month and if she continues to have irregular cycles will need to have OCPs switched. She is still breastfeeding.  BP Readings from Last 3 Encounters:  08/11/18 126/80  07/20/18 121/83  06/27/18 123/80     Observations/Objective: Awake, alert and oriented x3.     Assessment and Plan: Rhonda Avila was seen today for establish care.  Diagnoses and all orders for this visit:  Essential  hypertension -     Basic Metabolic Panel Resolved Remember to bring in your blood pressure log with you for your follow up appointment.  DASH/Mediterranean Diets are healthier choices for HTN.    Hyponatremia -     Basic Metabolic Panel  Fatigue, unspecified type -     VITAMIN D 25 Hydroxy (Vit-D Deficiency, Fractures) -     TSH -     CBC     Follow Up Instructions Return in about 4 weeks (around 11/01/2018) for metrorrhagia.     I discussed the assessment and treatment plan with the patient. The patient was provided an opportunity to ask questions and all were answered. The patient agreed with the plan and demonstrated an understanding of the instructions.   The patient was advised to call back or seek an in-person evaluation if the symptoms worsen or if the condition fails to improve as anticipated.  I provided 12 minutes of non-face-to-face time during this encounter including median intraservice time, reviewing previous notes, labs, imaging, medications and explaining diagnosis and management.  Claiborne Rigg, FNP-BC

## 2018-10-05 ENCOUNTER — Other Ambulatory Visit: Payer: Self-pay

## 2018-10-05 ENCOUNTER — Ambulatory Visit: Payer: Self-pay | Attending: Nurse Practitioner

## 2018-10-06 LAB — CBC
Hematocrit: 39.7 % (ref 34.0–46.6)
Hemoglobin: 13.4 g/dL (ref 11.1–15.9)
MCH: 29.4 pg (ref 26.6–33.0)
MCHC: 33.8 g/dL (ref 31.5–35.7)
MCV: 87 fL (ref 79–97)
Platelets: 231 10*3/uL (ref 150–450)
RBC: 4.56 x10E6/uL (ref 3.77–5.28)
RDW: 12.3 % (ref 11.7–15.4)
WBC: 7.3 10*3/uL (ref 3.4–10.8)

## 2018-10-06 LAB — BASIC METABOLIC PANEL
BUN/Creatinine Ratio: 12 (ref 9–23)
BUN: 8 mg/dL (ref 6–20)
CO2: 24 mmol/L (ref 20–29)
Calcium: 9.5 mg/dL (ref 8.7–10.2)
Chloride: 104 mmol/L (ref 96–106)
Creatinine, Ser: 0.66 mg/dL (ref 0.57–1.00)
GFR calc Af Amer: 140 mL/min/{1.73_m2} (ref >59)
GFR calc non Af Amer: 121 mL/min/{1.73_m2} (ref >59)
Glucose: 85 mg/dL (ref 65–99)
Potassium: 4.2 mmol/L (ref 3.5–5.2)
Sodium: 138 mmol/L (ref 134–144)

## 2018-10-06 LAB — VITAMIN D 25 HYDROXY (VIT D DEFICIENCY, FRACTURES): Vit D, 25-Hydroxy: 24.1 ng/mL — ABNORMAL LOW (ref 30.0–100.0)

## 2018-10-06 LAB — TSH: TSH: 1.66 u[IU]/mL (ref 0.450–4.500)

## 2018-11-30 ENCOUNTER — Ambulatory Visit: Payer: Self-pay | Attending: Nurse Practitioner | Admitting: Physician Assistant

## 2018-11-30 ENCOUNTER — Other Ambulatory Visit: Payer: Self-pay

## 2018-11-30 VITALS — BP 114/72 | HR 64 | Temp 98.2°F | Ht 62.0 in | Wt 223.0 lb

## 2018-11-30 DIAGNOSIS — R635 Abnormal weight gain: Secondary | ICD-10-CM

## 2018-11-30 DIAGNOSIS — Z30011 Encounter for initial prescription of contraceptive pills: Secondary | ICD-10-CM

## 2018-11-30 DIAGNOSIS — R300 Dysuria: Secondary | ICD-10-CM

## 2018-11-30 LAB — POCT URINALYSIS DIP (CLINITEK)
Bilirubin, UA: NEGATIVE
Blood, UA: NEGATIVE
Glucose, UA: NEGATIVE mg/dL
Ketones, POC UA: NEGATIVE mg/dL
Leukocytes, UA: NEGATIVE
Nitrite, UA: NEGATIVE
POC PROTEIN,UA: NEGATIVE
Spec Grav, UA: 1.01 (ref 1.010–1.025)
Urobilinogen, UA: 0.2 E.U./dL
pH, UA: 5.5 (ref 5.0–8.0)

## 2018-11-30 MED ORDER — ETONOGESTREL-ETHINYL ESTRADIOL 0.12-0.015 MG/24HR VA RING: VAGINAL_RING | VAGINAL | 12 refills | Status: DC

## 2018-11-30 MED FILL — **NUVARING VAGINAL RING: 0.12-0.015 | 28 days supply | Qty: 1 | Fill #0

## 2018-11-30 NOTE — Patient Instructions (Signed)
Preventing Unhealthy Weight Gain, Adult  Staying at a healthy weight is important to your overall health. When fat builds up in your body, you may become overweight or obese. Being overweight or obese increases your risk of developing certain health problems, such as heart disease, diabetes, sleeping problems, joint problems, and some types of cancer.  Unhealthy weight gain is often the result of making unhealthy food choices or not getting enough exercise. You can make changes to your lifestyle to prevent obesity and stay as healthy as possible.  What nutrition changes can be made?    Eat only as much as your body needs. To do this:  Pay attention to signs that you are hungry or full. Stop eating as soon as you feel full.  If you feel hungry, try drinking water first before eating. Drink enough water so your urine is clear or pale yellow.  Eat smaller portions. Pay attention to portion sizes when eating out.  Look at serving sizes on food labels. Most foods contain more than one serving per container.  Eat the recommended number of calories for your gender and activity level. For most active people, a daily total of 2,000 calories is appropriate. If you are trying to lose weight or are not very active, you may need to eat fewer calories. Talk with your health care provider or a diet and nutrition specialist (dietitian) about how many calories you need each day.  Choose healthy foods, such as:  Fruits and vegetables. At each meal, try to fill at least half of your plate with fruits and vegetables.  Whole grains, such as whole-wheat bread, brown rice, and quinoa.  Lean meats, such as chicken or fish.  Other healthy proteins, such as beans, eggs, or tofu.  Healthy fats, such as nuts, seeds, fatty fish, and olive oil.  Low-fat or fat-free dairy products.  Check food labels, and avoid food and drinks that:  Are high in calories.  Have added sugar.  Are high in sodium.  Have saturated fats or trans fats.  Cook foods  in healthier ways, such as by baking, broiling, or grilling.  Make a meal plan for the week, and shop with a grocery list to help you stay on track with your purchases. Try to avoid going to the grocery store when you are hungry.  When grocery shopping, try to shop around the outside of the store first, where the fresh foods are. Doing this helps you to avoid prepackaged foods, which can be high in sugar, salt (sodium), and fat.  What lifestyle changes can be made?    Exercise for 30 or more minutes on 5 or more days each week. Exercising may include brisk walking, yard work, biking, running, swimming, and team sports like basketball and soccer. Ask your health care provider which exercises are safe for you.  Do muscle-strengthening activities, such as lifting weights or using resistance bands, on 2 or more days a week.  Do not use any products that contain nicotine or tobacco, such as cigarettes and e-cigarettes. If you need help quitting, ask your health care provider.  Limit alcohol intake to no more than 1 drink a day for nonpregnant women and 2 drinks a day for men. One drink equals 12 oz of beer, 5 oz of wine, or 1 oz of hard liquor.  Try to get 7-9 hours of sleep each night.  What other changes can be made?  Keep a food and activity journal to keep track of:    What you ate and how many calories you had. Remember to count the calories in sauces, dressings, and side dishes.  Whether you were active, and what exercises you did.  Your calorie, weight, and activity goals.  Check your weight regularly. Track any changes. If you notice you have gained weight, make changes to your diet or activity routine.  Avoid taking weight-loss medicines or supplements. Talk to your health care provider before starting any new medicine or supplement.  Talk to your health care provider before trying any new diet or exercise plan.  Why are these changes important?  Eating healthy, staying active, and having healthy habits can help  you to prevent obesity. Those changes also:  Help you manage stress and emotions.  Help you connect with friends and family.  Improve your self-esteem.  Improve your sleep.  Prevent long-term health problems.  What can happen if changes are not made?  Being obese or overweight can cause you to develop joint or bone problems, which can make it hard for you to stay active or do activities you enjoy. Being obese or overweight also puts stress on your heart and lungs and can lead to health problems like diabetes, heart disease, and some cancers.  Where to find more information  Talk with your health care provider or a dietitian about healthy eating and healthy lifestyle choices. You may also find information from:  U.S. Department of Agriculture, MyPlate: www.choosemyplate.gov  American Heart Association: www.heart.org  Centers for Disease Control and Prevention: www.cdc.gov  Summary  Staying at a healthy weight is important to your overall health. It helps you to prevent certain diseases and health problems, such as heart disease, diabetes, joint problems, sleep disorders, and some types of cancer.  Being obese or overweight can cause you to develop joint or bone problems, which can make it hard for you to stay active or do activities you enjoy.  You can prevent unhealthy weight gain by eating a healthy diet, exercising regularly, not smoking, limiting alcohol, and getting enough sleep.  Talk with your health care provider or a dietitian for guidance about healthy eating and healthy lifestyle choices.  This information is not intended to replace advice given to you by your health care provider. Make sure you discuss any questions you have with your health care provider.  Document Released: 06/02/2016 Document Revised: 03/12/2017 Document Reviewed: 07/08/2016  Elsevier Interactive Patient Education  2019 [employer]

## 2018-11-30 NOTE — Progress Notes (Signed)
Rhonda Avila, is a 28 y.o. female  [id number]  [id number]  DOB - [birth year]  Subjective:  Chief Complaint and HPI: Rhonda Avila is a 28 y.o. female here today for several issues-on and off dysuria for about 1 week.  No f/c.  No flank/abdominal pain.  No N/V.  Denies vaginal d/c. Monogamous with husband.  No urinary frequency No longer nursing.  Wants to switch BC back to Nuva-ring. Baby was born in January.    Also concerned about weight gain.  Labs reviewed from about 6 weeks ago and normal.    ROS:   Constitutional:  No f/c, No night sweats, No unexplained weight loss. EENT:  No vision changes, No blurry vision, No hearing changes. No mouth, throat, or ear problems.  Respiratory: No cough, No SOB Cardiac: No CP, no palpitations GI:  No abd pain, No N/V/D. GU: see above Musculoskeletal: No joint pain Neuro: No headache, no dizziness, no motor weakness.  Skin: No rash Endocrine:  No polydipsia. No polyuria.  Psych: Denies SI/HI  No problems updated.  ALLERGIES: No Known Allergies  PAST MEDICAL HISTORY: Past Medical History:  Diagnosis Date  . HSV (herpes simplex virus) infection   . Medical history non-contributory   . Pregnancy induced hypertension     MEDICATIONS AT HOME: Prior to Admission medications   Medication Sig Start Date End Date Taking? Authorizing Provider  etonogestrel-ethinyl estradiol (NUVARING) 0.12-0.015 MG/24HR vaginal ring Insert vaginally and leave in place for 3 consecutive weeks, then remove for 1 week. 11/30/18   Anders SimmondsMcClung, Allex Madia M, PA-C  Prenatal Vit-Fe Fumarate-FA (PRENATAL MULTIVITAMIN) TABS tablet Take 1 tablet by mouth daily at 12 noon.    [provider]     Objective:  EXAM:   Vitals:   11/30/18 1423  BP: 114/72  Pulse: 64  Temp: 98.2 F (36.8 C)  TempSrc: Oral  SpO2: 97%  Weight: 223 lb (101.2 kg)  Height: 5\' 2"  (1.575 m)    General appearance : A&OX3. NAD. Non-toxic-appearing.   Limited exam to mitigate Covid risk HEENT: Atraumatic and Normocephalic.  PERRLA. EOM intact.  Chest/Lungs:  Breathing-non-labored, Good air entry bilaterally, breath sounds normal without rales, rhonchi, or wheezing  CVS: S1 S2 regular, no murmurs, gallops, rubs  Neurology:  CN II-XII grossly intact, Non focal.   Psych:  TP linear. J/I WNL. Normal speech. Appropriate eye contact and affect.  Skin:  No Rash  Data Review Lab Results  Component Value Date   HGBA1C 4.9 01/03/2018     Assessment & Plan   1. Dysuria UA dip WNL.  Drink 80-100 ounces water daily - POCT URINALYSIS DIP (CLINITEK) - Urine cytology ancillary only - Urine Culture  2. Family planning, BCP (birth control pills) initial prescription Stop Micronor and start nuva-ring.  Use condomsX 2 weeks during transition - etonogestrel-ethinyl estradiol (NUVARING) 0.12-0.015 MG/24HR vaginal ring; Insert vaginally and leave in place for 3 consecutive weeks, then remove for 1 week.  Dispense: 1 each; Refill: 12  3. Weight gain Spent >5830mins face to face on proper diet and exercise.  Limit sugar/white carbs.       Patient have been counseled extensively about nutrition and exercise  Return if symptoms worsen or fail to improve.  The patient was given clear instructions to go to ER or return to medical center if symptoms don't improve, worsen or new problems develop. The patient verbalized understanding. The patient was told to call to get lab results if they haven't heard anything  in the next week.     Freeman Caldron, PA-C [facility] [place], [place]   11/30/2018, 2:45 PM

## 2018-12-01 LAB — URINE CYTOLOGY ANCILLARY ONLY
Chlamydia: NEGATIVE
Neisseria Gonorrhea: NEGATIVE
Trichomonas: NEGATIVE

## 2018-12-02 LAB — URINE CYTOLOGY ANCILLARY ONLY: Candida vaginitis: NEGATIVE

## 2018-12-02 LAB — URINE CULTURE

## 2018-12-05 ENCOUNTER — Other Ambulatory Visit: Payer: Self-pay | Admitting: Physician Assistant

## 2018-12-05 MED ORDER — METRONIDAZOLE 500 MG PO TABS: 500.0000 mg | ORAL_TABLET | Freq: Two times a day (BID) | ORAL | 0 refills | Status: DC

## 2018-12-06 MED FILL — metroNIDAZOLE 500 MG TABS: 500 | 7 days supply | Qty: 14 | Fill #0

## 2018-12-27 MED FILL — **NUVARING VAGINAL RING: 0.12-0.015 | 28 days supply | Qty: 1 | Fill #1

## 2018-12-29 ENCOUNTER — Other Ambulatory Visit: Payer: Self-pay

## 2018-12-29 DIAGNOSIS — Z20822 Contact with and (suspected) exposure to covid-19: Secondary | ICD-10-CM

## 2018-12-31 LAB — NOVEL CORONAVIRUS, NAA: SARS-CoV-2, NAA: NOT DETECTED

## 2019-01-07 ENCOUNTER — Encounter: Payer: Self-pay | Admitting: Physician Assistant

## 2019-01-24 MED FILL — NUVARING VAGINAL RING: 0.12-0.015 | 28 days supply | Qty: 1 | Fill #2

## 2019-01-31 IMAGING — US US MFM UA CORD DOPPLER
1 series · 13 of 28 positions shown · non-contrast
Comparison: none

[Series 1: us mfm ua cord doppler · 47 acquisitions, 13 frames shown]
[im 2/47]
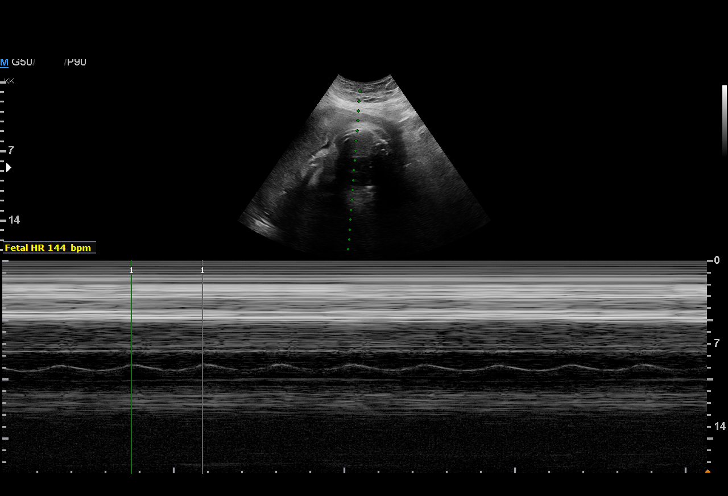
[im 6/47]
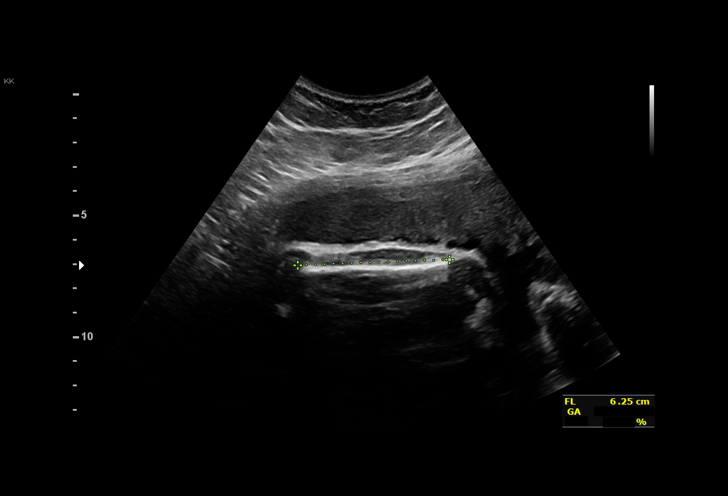
[im 9/47]
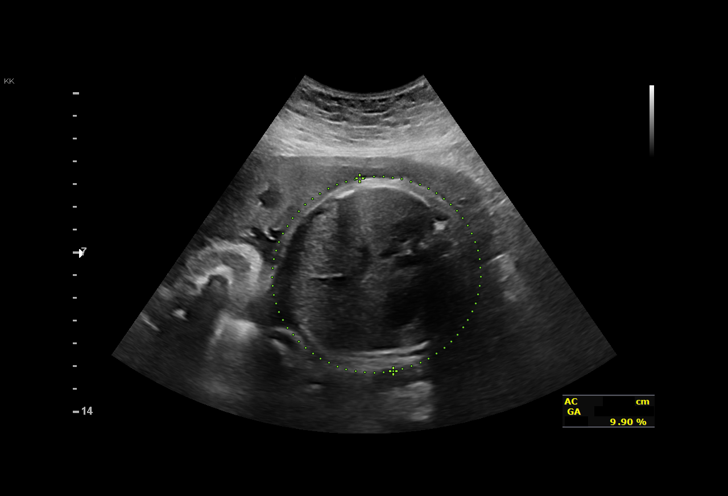
[im 12/47]
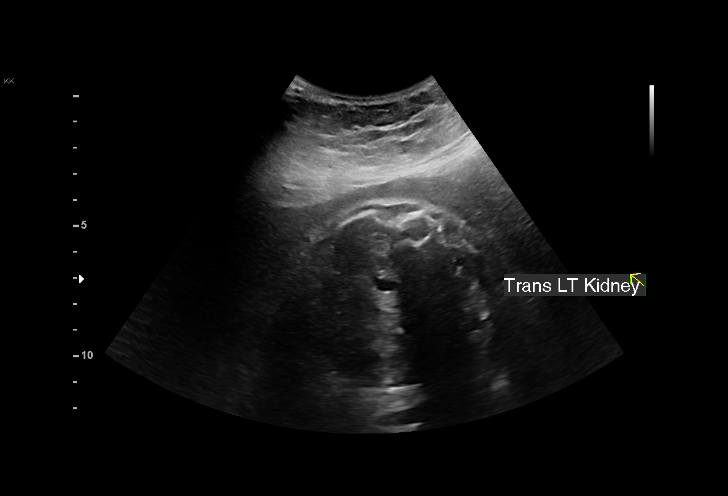
[im 16/47]
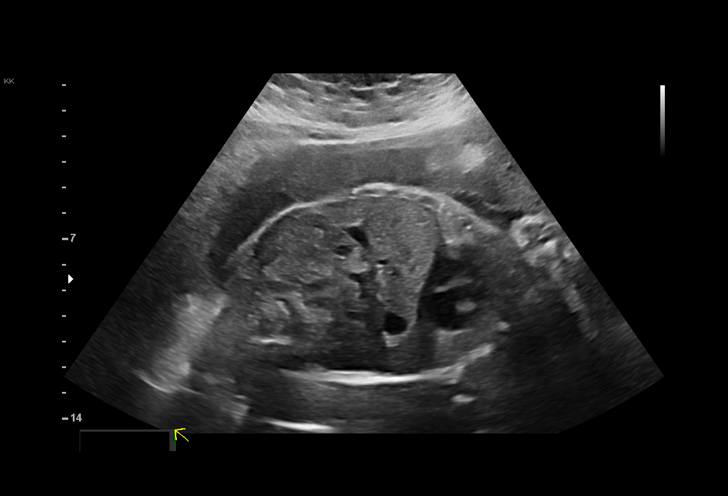
[im 19/47]
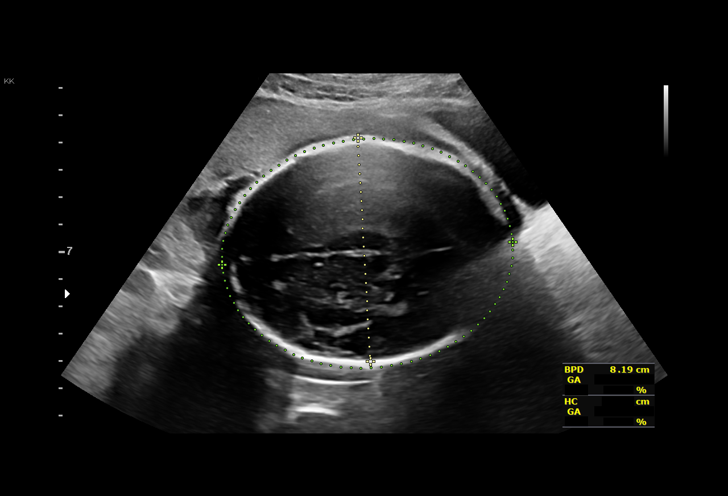
[im 24/47]
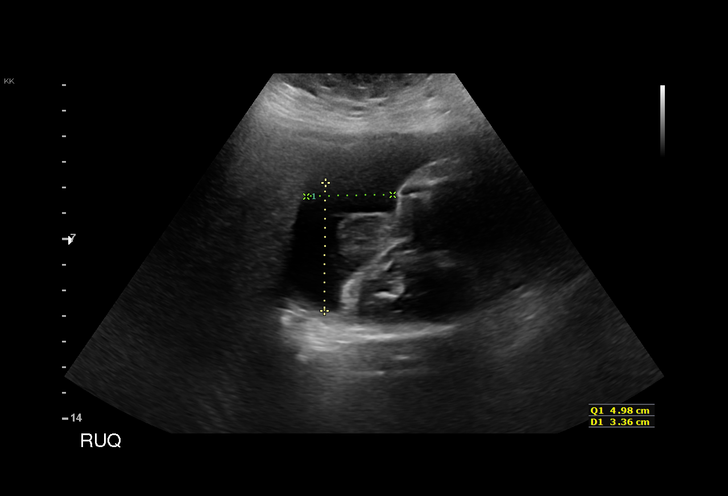
[im 28/47]
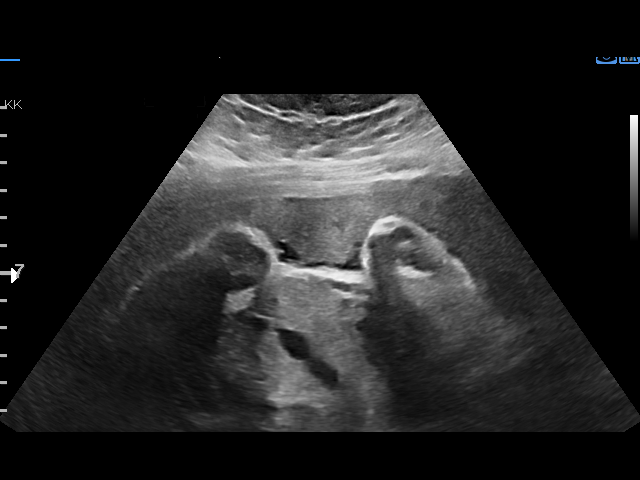
[im 31/47]
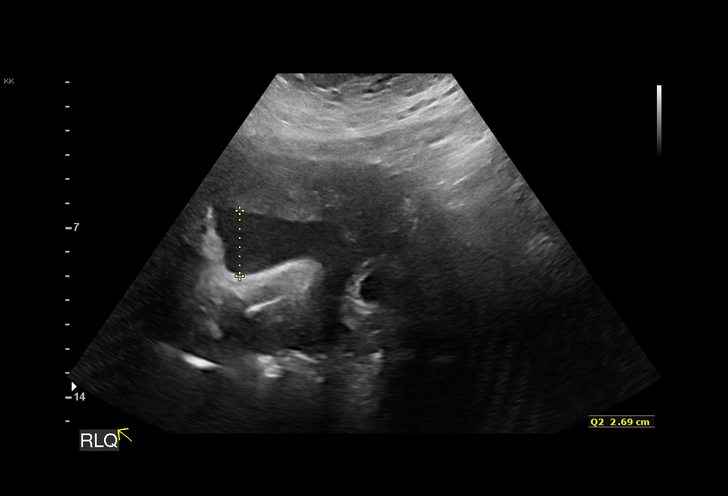
[im 35/47]
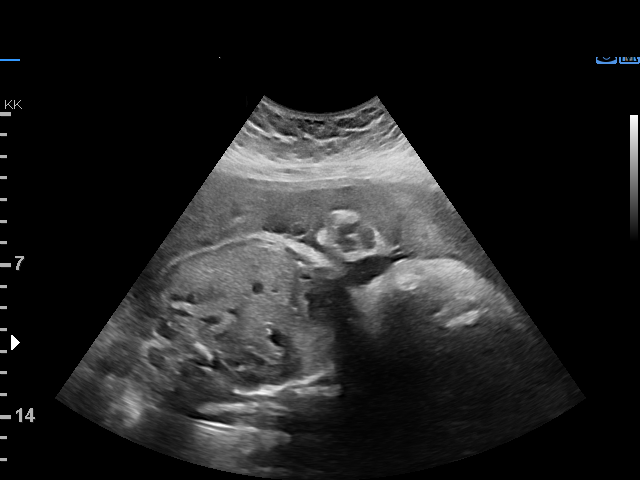
[im 38/47]
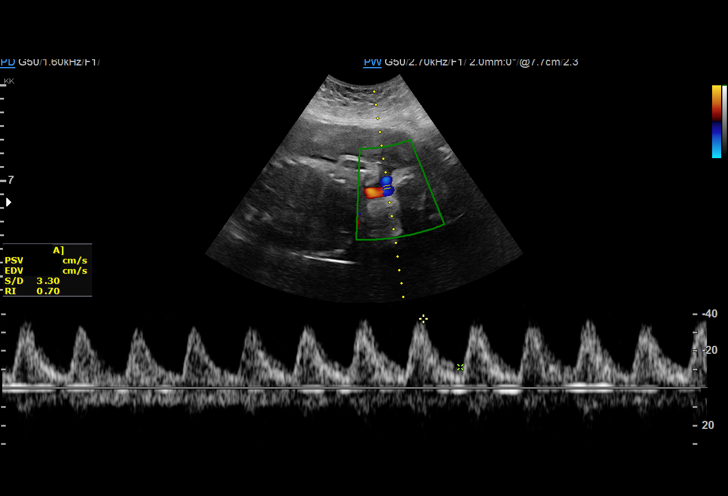
[im 41/47]
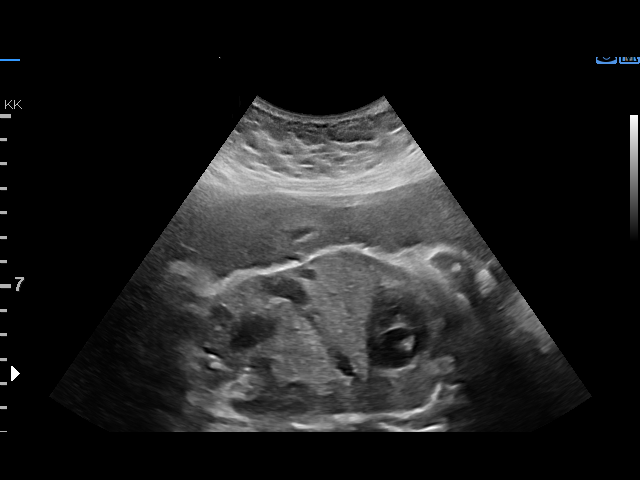
[im 45/47]
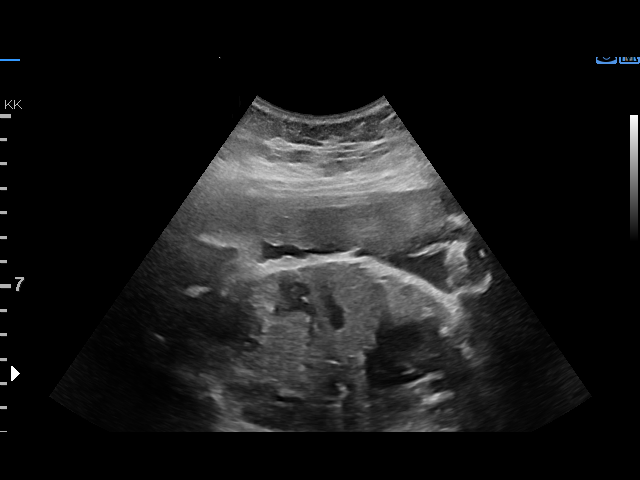

[13 of 28 positions shown; findings below may reference images not displayed]

[facility]

                                                            [facility]
 Attending:        Amandeep Perrone        Secondary Phy.:   3rd Nursing- [employer]
                                                            OB

  3  US MFM UA CORD DOPPLER               [id number]     KLPIGBB MOOLMAN
 ----------------------------------------------------------------------

 ----------------------------------------------------------------------
Indications

  Hypertension - Gestational
  Severe preeclampsia, third trimester
  Medical complication of pregnancy (PCOS -
  metformin)
  33 weeks gestation of pregnancy
 ----------------------------------------------------------------------
Fetal Evaluation

 Num Of Fetuses:         1
 Fetal Heart Rate(bpm):  144
 Cardiac Activity:       Observed
 Presentation:           Cephalic
 Placenta:               Anterior
 P. Cord Insertion:      Previously Visualized

 Amniotic Fluid
 AFI FV:      Within normal limits

 AFI Sum(cm)     %Tile       Largest Pocket(cm)
 13.62           45
 RUQ(cm)       RLQ(cm)       LUQ(cm)        LLQ(cm)

Biophysical Evaluation

 Amniotic F.V:   Pocket => 2 cm two         F. Tone:        Observed
                 planes
 F. Movement:    Observed                   Score:          [DATE]
 F. Breathing:   Observed
Biometry

 BPD:      81.9  mm     G. Age:  33w 0d         24  %    CI:        75.28   %    70 - 86
                                                         FL/HC:      20.8   %    19.4 -
 HC:      299.4  mm     G. Age:  33w 1d          9  %    HC/AC:      1.10        0.96 -
 AC:      271.8  mm     G. Age:  31w 2d          4  %    FL/BPD:     75.9   %    71 - 87
 FL:       62.2  mm     G. Age:  32w 1d         10  %    FL/AC:      22.9   %    20 - 24

 Est. FW:    6121  gm      4 lb 2 oz     25  %
OB History

 Gravidity:    1         Term:   0        Prem:   0        SAB:   0
 TOP:          0       Ectopic:  0        Living: 0
Gestational Age

 LMP:           33w 5d        Date:  10/23/17                 EDD:   07/30/18
 U/S Today:     32w 3d                                        EDD:   08/08/18
 Best:          33w 5d     Det. By:  LMP  (10/23/17)          EDD:   07/30/18
Anatomy

 Cranium:               Appears normal         Aortic Arch:            Previously seen
 Cavum:                 Previously seen        Ductal Arch:            Previously seen
 Ventricles:            Appears normal         Diaphragm:              Appears normal
 Choroid Plexus:        Previously seen        Stomach:                Appears normal, left
                                                                       sided
 Cerebellum:            Previously seen        Abdomen:                Appears normal
 Posterior Fossa:       Previously seen        Abdominal Wall:         Previously seen
 Nuchal Fold:           Previously seen        Cord Vessels:           Previously seen
 Face:                  Orbits and profile     Kidneys:                Appear normal
                        previously seen
 Lips:                  Previously seen        Bladder:                Appears normal
 Thoracic:              Appears normal         Spine:                  Previously seen
 Heart:                 Previously seen        Upper Extremities:      Previously seen
 RVOT:                  Previously seen        Lower Extremities:      Previously seen
 LVOT:                  Previously seen

 Other:  Heels, 5th digit, and Nasal bone previously visualized. Technically
         difficult due to fetal position. Technically difficult due to maternal
         habitus.
Doppler - Fetal Vessels

 Umbilical Artery
  S/D     %tile     RI              PI                     ADFV    RDFV
 3.35       87     0.7            1.19                        No      No
Cervix Uterus Adnexa

 Cervix
 Not visualized (advanced GA >54wks)
Impression

 Patient is admitted with severe range blood pressures.
 Increased proteinuria confirms the diagnosis of preeclampsia
 with severe features. Labs including liver enzymes, creatinine
 and platelets are normal.
 Fetal growth is appropriate for gestational age. Abdominal
 circumference measurement is at less than the 5th
 percentile. Amniotic fluid is normal and good fetal activity is
 seen. Antenatal testing is reassuring. BPP [DATE]. Umbilical
 artery Doppler showed normal diastolic flow.
 Discussed with Dr. Aujla.
Recommendations

 -Recommend delivery at 34 weeks.
                 Berra, Hanini

## 2019-02-03 ENCOUNTER — Ambulatory Visit: Payer: Self-pay

## 2019-02-08 ENCOUNTER — Other Ambulatory Visit: Payer: Self-pay

## 2019-02-08 DIAGNOSIS — Z20822 Contact with and (suspected) exposure to covid-19: Secondary | ICD-10-CM

## 2019-02-09 LAB — NOVEL CORONAVIRUS, NAA: SARS-CoV-2, NAA: NOT DETECTED

## 2019-02-13 ENCOUNTER — Other Ambulatory Visit: Payer: Self-pay

## 2019-02-13 DIAGNOSIS — Z20822 Contact with and (suspected) exposure to covid-19: Secondary | ICD-10-CM

## 2019-02-15 LAB — NOVEL CORONAVIRUS, NAA: SARS-CoV-2, NAA: DETECTED — AB

## 2019-02-24 MED FILL — NUVARING VAGINAL RING: 0.12-0.015 | 28 days supply | Qty: 1 | Fill #3

## 2019-03-20 MED FILL — NUVARING VAGINAL RING: 0.12-0.015 | 28 days supply | Qty: 1 | Fill #4

## 2019-04-12 MED FILL — NUVARING VAGINAL RING: 0.12-0.015 | 28 days supply | Qty: 1 | Fill #5

## 2019-04-18 ENCOUNTER — Other Ambulatory Visit: Payer: Self-pay

## 2019-04-18 ENCOUNTER — Ambulatory Visit: Payer: Self-pay | Attending: Nurse Practitioner

## 2019-04-20 ENCOUNTER — Ambulatory Visit: Payer: Self-pay | Admitting: Pharmacist

## 2019-04-20 ENCOUNTER — Other Ambulatory Visit: Payer: Self-pay

## 2019-04-20 DIAGNOSIS — Z23 Encounter for immunization: Secondary | ICD-10-CM

## 2019-04-20 NOTE — Progress Notes (Signed)
Patient presents for vaccination against influenza per orders of Zelda. Consent given. Counseling provided. No contraindications exists. Vaccine administered without incident.   

## 2019-05-04 ENCOUNTER — Encounter: Payer: Self-pay | Admitting: Nurse Practitioner

## 2019-05-22 MED FILL — NUVARING VAGINAL RING: 0.12-0.015 | 28 days supply | Qty: 1 | Fill #6

## 2019-05-23 ENCOUNTER — Other Ambulatory Visit: Payer: Self-pay

## 2019-05-23 ENCOUNTER — Encounter: Payer: Self-pay | Admitting: Nurse Practitioner

## 2019-05-23 ENCOUNTER — Ambulatory Visit: Payer: Self-pay | Attending: Nurse Practitioner | Admitting: Nurse Practitioner

## 2019-05-23 VITALS — BP 123/74 | HR 75 | Temp 98.8°F | Ht 62.0 in | Wt 225.0 lb

## 2019-05-23 DIAGNOSIS — R635 Abnormal weight gain: Secondary | ICD-10-CM

## 2019-05-23 DIAGNOSIS — B379 Candidiasis, unspecified: Secondary | ICD-10-CM

## 2019-05-23 MED ORDER — PHENTERMINE HCL 37.5 MG PO TABS: 37.5000 mg | ORAL_TABLET | Freq: Every day | ORAL | 1 refills | Status: DC

## 2019-05-23 MED ORDER — NYSTATIN 100000 UNIT/GM EX CREA: 1.0000 "application " | TOPICAL_CREAM | Freq: Two times a day (BID) | CUTANEOUS | 1 refills | Status: DC

## 2019-05-23 MED FILL — NYSTATIN 100,000 UNIT/GM CR: 100000 | 30 days supply | Qty: 60 | Fill #0

## 2019-05-23 NOTE — Progress Notes (Signed)
Assessment & Plan:  Rhonda Avila was seen today for lack of energy.  Diagnoses and all orders for this visit:  Weight gain -     phentermine (ADIPEX-P) 37.5 MG tablet; Take 1 tablet (37.5 mg total) by mouth daily before breakfast. Discussed diet and exercise for person with BMI >40. Instructed: You must burn more calories than you eat. Losing 5 percent of your body weight should be considered a success. In the longer term, losing more than 15 percent of your body weight and staying at this weight is an extremely good result. However, keep in mind that even losing 5 percent of your body weight leads to important health benefits, so try not to get discouraged if you're not able to lose more than this. Will recheck weight in 3-6 months.  Candidiasis -     nystatin cream (MYCOSTATIN); Apply 1 application topically 2 (two) times daily.    Patient has been counseled on age-appropriate routine health concerns for screening and prevention. These are reviewed and up-to-date. Referrals have been placed accordingly. Immunizations are up-to-date or declined.    Subjective:   Chief Complaint  Patient presents with  . Lack of energy    Pt. stated she's been having lack of energy, not wanting to do things.   HPI Rhonda Avila 28 y.o. female presents to office today for follow up.  This note is not being shared with the patient for the following reason: To respect privacy (The patient or proxy has requested that the information not be shared). Sensitive behavioral health information.   Endorsing fatigue and weight gain. Has an 38 month old child. Not exercising. Home with baby all day. Has supportive family. PHQ9 is elevated. She declines SSRI today. We discussed exercise, clean eating and meditation as alternatives today. Her weight is also a factor in her mood. Would like to try weight loss medication in conjunction with exercise. BMI 41.  Depression screen [facility] 2/9 05/23/2019 11/30/2018  08/11/2018 06/01/2018 03/29/2018  Decreased Interest 3 1 0 2 1  Down, Depressed, Hopeless 3 0 0 2 1  PHQ - 2 Score 6 1 0 4 2  Altered sleeping 0 0 0 3 1  Tired, decreased energy 3 3 0 3 1  Change in appetite 3 0 1 2 3   Feeling bad or failure about yourself  0 0 0 0 0  Trouble concentrating 0 0 0 1 0  Moving slowly or fidgety/restless 3 0 0 3 1  Suicidal thoughts 0 0 0 0 0  PHQ-9 Score 15 4 1 16 8   Difficult doing work/chores - - - - -  Some recent data might be hidden   Rash: Patient complains of rash involving the intertrigunous areas (Breast, groin areas). Rash is chronic and intermittent in nature.  Discomfort associated with rash: is pruritic.  Associated symptoms: none. Patient has not had previous evaluation of rash. Patient has not had previous treatment.     Review of Systems  Constitutional: Negative for fever, malaise/fatigue and weight loss.  HENT: Negative.  Negative for nosebleeds.   Eyes: Negative.  Negative for blurred vision, double vision and photophobia.  Respiratory: Negative.  Negative for cough and shortness of breath.   Cardiovascular: Negative.  Negative for chest pain, palpitations and leg swelling.  Gastrointestinal: Negative.  Negative for heartburn, nausea and vomiting.  Musculoskeletal: Negative.  Negative for myalgias.  Neurological: Negative.  Negative for dizziness, focal weakness, seizures and headaches.  Psychiatric/Behavioral: Positive for depression. Negative for  suicidal ideas.    Past Medical History:  Diagnosis Date  . HSV (herpes simplex virus) infection   . Medical history non-contributory   . Pregnancy induced hypertension     Past Surgical History:  Procedure Laterality Date  . NO PAST SURGERIES      Family History  Problem Relation Age of Onset  . Hypertension Maternal Grandmother   . Hypertension Mother     Social History Reviewed with no changes to be made today.   Outpatient Medications Prior to Visit  Medication Sig  Dispense Refill  . etonogestrel-ethinyl estradiol (NUVARING) 0.12-0.015 MG/24HR vaginal ring Insert vaginally and leave in place for 3 consecutive weeks, then remove for 1 week. 1 each 12  . metroNIDAZOLE (FLAGYL) 500 MG tablet Take 1 tablet (500 mg total) by mouth 2 (two) times daily. 14 tablet 0  . Prenatal Vit-Fe Fumarate-FA (PRENATAL MULTIVITAMIN) TABS tablet Take 1 tablet by mouth daily at 12 Avila.     No facility-administered medications prior to visit.    No Known Allergies     Objective:    BP 123/74 (BP Location: Left Arm, Patient Position: Sitting, Cuff Size: Large)   Pulse 75   Temp 98.8 F (37.1 C) (Oral)   Ht 5\' 2"  (1.575 m)   Wt 225 lb (102.1 kg)   LMP  (Approximate)   SpO2 98%   BMI 41.15 kg/m  Wt Readings from Last 3 Encounters:  05/23/19 225 lb (102.1 kg)  11/30/18 223 lb (101.2 kg)  08/11/18 202 lb 6.4 oz (91.8 kg)    Physical Exam Vitals and nursing note reviewed.  Constitutional:      Appearance: She is well-developed.  HENT:     Head: Normocephalic and atraumatic.  Cardiovascular:     Rate and Rhythm: Normal rate and regular rhythm.     Heart sounds: Normal heart sounds. No murmur. No friction rub. No gallop.   Pulmonary:     Effort: Pulmonary effort is normal. No tachypnea or respiratory distress.     Breath sounds: Normal breath sounds. No decreased breath sounds, wheezing, rhonchi or rales.  Chest:     Chest wall: No tenderness.  Abdominal:     General: Bowel sounds are normal.     Palpations: Abdomen is soft.  Musculoskeletal:        General: Normal range of motion.     Cervical back: Normal range of motion.  Skin:    General: Skin is moist.     Findings: Rash present. Rash is macular.       Neurological:     Mental Status: She is alert and oriented to person, place, and time.     Coordination: Coordination normal.  Psychiatric:        Behavior: Behavior normal. Behavior is cooperative.        Thought Content: Thought content normal.         Judgment: Judgment normal.          Patient has been counseled extensively about nutrition and exercise as well as the importance of adherence with medications and regular follow-up. The patient was given clear instructions to go to ER or return to medical center if symptoms don't improve, worsen or new problems develop. The patient verbalized understanding.   Follow-up: Return in about 6 weeks (around 07/04/2019) for weight loss 6 weeks  and schedule PAP for feb. Gildardo Pounds, FNP-BC [facility] and [facility] [place], [place]   05/25/2019,  10:44 AM

## 2019-05-25 ENCOUNTER — Encounter: Payer: Self-pay | Admitting: Nurse Practitioner

## 2019-06-01 ENCOUNTER — Encounter: Payer: Self-pay | Admitting: Nurse Practitioner

## 2019-06-01 NOTE — Telephone Encounter (Signed)
Please assist patient in scheduling with PCP for concern

## 2019-06-07 NOTE — Telephone Encounter (Signed)
Spoke to patient. She is using Hydrocortisone cream and is not helping. Pt. Stated she have itchiness dryness on the top of her finger to the back of the hand. Pt. Stated it spread down to her wrist.   Pt. Was informed to go to Urgent Care if it gets painful.  Pt. Understood.

## 2019-06-10 ENCOUNTER — Other Ambulatory Visit: Payer: Self-pay | Admitting: Nurse Practitioner

## 2019-06-11 ENCOUNTER — Other Ambulatory Visit: Payer: Self-pay | Admitting: Nurse Practitioner

## 2019-06-11 MED ORDER — CLOTRIMAZOLE-BETAMETHASONE 1-0.05 % EX LOTN: TOPICAL_LOTION | Freq: Two times a day (BID) | CUTANEOUS | 0 refills | Status: DC

## 2019-06-13 ENCOUNTER — Other Ambulatory Visit: Payer: Self-pay

## 2019-06-13 MED ORDER — CLOTRIMAZOLE-BETAMETHASONE 1-0.05 % EX CREA: 1.0000 "application " | TOPICAL_CREAM | Freq: Two times a day (BID) | CUTANEOUS | 1 refills | Status: DC

## 2019-06-13 MED FILL — CLOTRIMAZOLE-BETAMETHASONE: 1-0.05 | 15 days supply | Qty: 30 | Fill #0

## 2019-06-13 NOTE — Progress Notes (Signed)
Lotrisone cream was sent and discontinue the lotion due to pricing was expensive.

## 2019-06-14 MED FILL — NUVARING VAGINAL RING: 0.12-0.015 | 28 days supply | Qty: 1 | Fill #7

## 2019-07-10 ENCOUNTER — Ambulatory Visit: Payer: Self-pay | Admitting: Nurse Practitioner

## 2019-07-11 ENCOUNTER — Ambulatory Visit: Payer: Self-pay | Admitting: Nurse Practitioner

## 2019-07-17 MED FILL — NUVARING VAGINAL RING: 0.12-0.015 | 28 days supply | Qty: 1 | Fill #8

## 2019-07-25 ENCOUNTER — Other Ambulatory Visit: Payer: Self-pay

## 2019-07-25 ENCOUNTER — Ambulatory Visit: Payer: Self-pay | Attending: Nurse Practitioner | Admitting: Nurse Practitioner

## 2019-07-25 ENCOUNTER — Encounter: Payer: Self-pay | Admitting: Nurse Practitioner

## 2019-07-25 VITALS — BP 122/77 | HR 86 | Temp 97.5°F | Ht 62.0 in | Wt 212.0 lb

## 2019-07-25 DIAGNOSIS — E669 Obesity, unspecified: Secondary | ICD-10-CM

## 2019-07-25 DIAGNOSIS — Z124 Encounter for screening for malignant neoplasm of cervix: Secondary | ICD-10-CM

## 2019-07-25 MED ORDER — PHENTERMINE HCL 37.5 MG PO TABS: 37.5000 mg | ORAL_TABLET | Freq: Every day | ORAL | 2 refills | Status: DC

## 2019-07-25 NOTE — Progress Notes (Signed)
Assessment & Plan:  Rhonda Avila was seen today for gynecologic exam.  Diagnoses and all orders for this visit:  Encounter for Papanicolaou smear for cervical cancer screening -     Cytology - PAP -     Cervicovaginal ancillary only  Obesity, Class II, BMI 35-39.9 -     phentermine (ADIPEX-P) 37.5 MG tablet; Take 1 tablet (37.5 mg total) by mouth daily before breakfast. Doing well. 13 lbs lost since December.   Patient has been counseled on age-appropriate routine health concerns for screening and prevention. These are reviewed and up-to-date. Referrals have been placed accordingly. Immunizations are up-to-date or declined.    Subjective:   Chief Complaint  Patient presents with  . Gynecologic Exam    Pt. is here for a pap smear.    HPI Robertta Ammie Dalton 29 y.o. female presents to office today for PAP SMEAR.   Review of Systems  Constitutional: Negative.  Negative for chills, fever, malaise/fatigue and weight loss.  Respiratory: Negative.  Negative for cough, shortness of breath and wheezing.   Cardiovascular: Negative.  Negative for chest pain, orthopnea and leg swelling.  Gastrointestinal: Negative for abdominal pain.  Genitourinary: Negative.  Negative for flank pain.  Skin: Negative.  Negative for rash.  Psychiatric/Behavioral: Negative for suicidal ideas.    Past Medical History:  Diagnosis Date  . HSV (herpes simplex virus) infection   . Medical history non-contributory   . Pregnancy induced hypertension     Past Surgical History:  Procedure Laterality Date  . NO PAST SURGERIES      Family History  Problem Relation Age of Onset  . Hypertension Maternal Grandmother   . Hypertension Mother     Social History Reviewed with no changes to be made today.   Outpatient Medications Prior to Visit  Medication Sig Dispense Refill  . etonogestrel-ethinyl estradiol (NUVARING) 0.12-0.015 MG/24HR vaginal ring Insert vaginally and leave in place for 3  consecutive weeks, then remove for 1 week. 1 each 12  . nystatin cream (MYCOSTATIN) Apply 1 application topically 2 (two) times daily. 60 g 1  . clotrimazole-betamethasone (LOTRISONE) cream Apply 1 application topically 2 (two) times daily. (Patient not taking: Reported on 07/25/2019) 30 g 1  . phentermine (ADIPEX-P) 37.5 MG tablet Take 1 tablet (37.5 mg total) by mouth daily before breakfast. 30 tablet 1   No facility-administered medications prior to visit.    No Known Allergies     Objective:    BP 122/77 (BP Location: Left Arm, Patient Position: Sitting, Cuff Size: Large)   Pulse 86   Temp (!) 97.5 F (36.4 C) (Temporal)   Ht 5\' 2"  (1.575 m)   Wt 212 lb (96.2 kg)   LMP 07/15/2019   SpO2 97%   BMI 38.78 kg/m  Wt Readings from Last 3 Encounters:  07/25/19 212 lb (96.2 kg)  05/23/19 225 lb (102.1 kg)  11/30/18 223 lb (101.2 kg)    Physical Exam Exam conducted with a chaperone present.  Constitutional:      Appearance: She is well-developed.  HENT:     Head: Normocephalic.  Cardiovascular:     Rate and Rhythm: Normal rate and regular rhythm.     Heart sounds: Normal heart sounds.  Pulmonary:     Effort: Pulmonary effort is normal.     Breath sounds: Normal breath sounds.  Abdominal:     General: Bowel sounds are normal.     Palpations: Abdomen is soft.     Hernia: There is  no hernia in the left inguinal area.  Genitourinary:    Exam position: Lithotomy position.     Labia:        Right: No rash, tenderness, lesion or injury.        Left: No rash, tenderness, lesion or injury.      Vagina: Normal. No signs of injury and foreign body. No vaginal discharge, erythema, tenderness or bleeding.     Cervix: Cervical bleeding (minimal) present. No discharge or lesion.     Uterus: Not deviated and not enlarged.      Adnexa:        Right: No mass, tenderness or fullness.         Left: No mass, tenderness or fullness.       Rectum: Normal. No external hemorrhoid.    Lymphadenopathy:     Lower Body: No right inguinal adenopathy. No left inguinal adenopathy.  Skin:    General: Skin is warm and dry.  Neurological:     Mental Status: She is alert and oriented to person, place, and time.  Psychiatric:        Behavior: Behavior normal.        Thought Content: Thought content normal.        Judgment: Judgment normal.          Patient has been counseled extensively about nutrition and exercise as well as the importance of adherence with medications and regular follow-up. The patient was given clear instructions to go to ER or return to medical center if symptoms don't improve, worsen or new problems develop. The patient verbalized understanding.   Follow-up: Return if symptoms worsen or fail to improve.   Claiborne Rigg, FNP-BC [facility] and [facility] [place], [place] [phone]   07/25/2019, 9:52 AM

## 2019-07-25 NOTE — Patient Instructions (Signed)
Health Maintenance, Female Adopting a healthy lifestyle and getting preventive care are important in promoting health and wellness. Ask your health care provider about:  The right schedule for you to have regular tests and exams.  Things you can do on your own to prevent diseases and keep yourself healthy. What should I know about diet, weight, and exercise? Eat a healthy diet   Eat a diet that includes plenty of vegetables, fruits, low-fat dairy products, and lean protein.  Do not eat a lot of foods that are high in solid fats, added sugars, or sodium. Maintain a healthy weight Body mass index (BMI) is used to identify weight problems. It estimates body fat based on height and weight. Your health care provider can help determine your BMI and help you achieve or maintain a healthy weight. Get regular exercise Get regular exercise. This is one of the most important things you can do for your health. Most adults should:  Exercise for at least 150 minutes each week. The exercise should increase your heart rate and make you sweat (moderate-intensity exercise).  Do strengthening exercises at least twice a week. This is in addition to the moderate-intensity exercise.  Spend less time sitting. Even light physical activity can be beneficial. Watch cholesterol and blood lipids Have your blood tested for lipids and cholesterol at 29 years of age, then have this test every 5 years. Have your cholesterol levels checked more often if:  Your lipid or cholesterol levels are high.  You are older than 29 years of age.  You are at high risk for heart disease. What should I know about cancer screening? Depending on your health history and family history, you may need to have cancer screening at various ages. This may include screening for:  Breast cancer.  Cervical cancer.  Colorectal cancer.  Skin cancer.  Lung cancer. What should I know about heart disease, diabetes, and high blood  pressure? Blood pressure and heart disease  High blood pressure causes heart disease and increases the risk of stroke. This is more likely to develop in people who have high blood pressure readings, are of African descent, or are overweight.  Have your blood pressure checked: ? Every 3-5 years if you are 18-39 years of age. ? Every year if you are 40 years old or older. Diabetes Have regular diabetes screenings. This checks your fasting blood sugar level. Have the screening done:  Once every three years after age 40 if you are at a normal weight and have a low risk for diabetes.  More often and at a younger age if you are overweight or have a high risk for diabetes. What should I know about preventing infection? Hepatitis B If you have a higher risk for hepatitis B, you should be screened for this virus. Talk with your health care provider to find out if you are at risk for hepatitis B infection. Hepatitis C Testing is recommended for:  Everyone born from 1945 through 1965.  Anyone with known risk factors for hepatitis C. Sexually transmitted infections (STIs)  Get screened for STIs, including gonorrhea and chlamydia, if: ? You are sexually active and are younger than 29 years of age. ? You are older than 29 years of age and your health care provider tells you that you are at risk for this type of infection. ? Your sexual activity has changed since you were last screened, and you are at increased risk for chlamydia or gonorrhea. Ask your health care provider if   you are at risk.  Ask your health care provider about whether you are at high risk for HIV. Your health care provider may recommend a prescription medicine to help prevent HIV infection. If you choose to take medicine to prevent HIV, you should first get tested for HIV. You should then be tested every 3 months for as long as you are taking the medicine. Pregnancy  If you are about to stop having your period (premenopausal) and  you may become pregnant, seek counseling before you get pregnant.  Take 400 to 800 micrograms (mcg) of folic acid every day if you become pregnant.  Ask for birth control (contraception) if you want to prevent pregnancy. Osteoporosis and menopause Osteoporosis is a disease in which the bones lose minerals and strength with aging. This can result in bone fractures. If you are 65 years old or older, or if you are at risk for osteoporosis and fractures, ask your health care provider if you should:  Be screened for bone loss.  Take a calcium or vitamin D supplement to lower your risk of fractures.  Be given hormone replacement therapy (HRT) to treat symptoms of menopause. Follow these instructions at home: Lifestyle  Do not use any products that contain nicotine or tobacco, such as cigarettes, e-cigarettes, and chewing tobacco. If you need help quitting, ask your health care provider.  Do not use street drugs.  Do not share needles.  Ask your health care provider for help if you need support or information about quitting drugs. Alcohol use  Do not drink alcohol if: ? Your health care provider tells you not to drink. ? You are pregnant, may be pregnant, or are planning to become pregnant.  If you drink alcohol: ? Limit how much you use to 0-1 drink a day. ? Limit intake if you are breastfeeding.  Be aware of how much alcohol is in your drink. In the [place] one drink equals one 12 oz bottle of beer (355 mL), one 5 oz glass of wine (148 mL), or one 1 oz glass of hard liquor (44 mL). General instructions  Schedule regular health, dental, and eye exams.  Stay current with your vaccines.  Tell your health care provider if: ? You often feel depressed. ? You have ever been abused or do not feel safe at home. Summary  Adopting a healthy lifestyle and getting preventive care are important in promoting health and wellness.  Follow your health care provider's instructions about healthy  diet, exercising, and getting tested or screened for diseases.  Follow your health care provider's instructions on monitoring your cholesterol and blood pressure. This information is not intended to replace advice given to you by your health care provider. Make sure you discuss any questions you have with your health care provider. Document Revised: 05/25/2018 Document Reviewed: 05/25/2018 Elsevier Patient Education  2020 [employer]  

## 2019-07-26 LAB — CERVICOVAGINAL ANCILLARY ONLY
Bacterial Vaginitis (gardnerella): POSITIVE — AB
Candida Glabrata: NEGATIVE
Candida Vaginitis: NEGATIVE
Chlamydia: NEGATIVE
Comment: NEGATIVE
Comment: NEGATIVE
Comment: NEGATIVE
Comment: NEGATIVE
Comment: NEGATIVE
Comment: NORMAL
Neisseria Gonorrhea: NEGATIVE
Trichomonas: NEGATIVE

## 2019-07-26 LAB — BASIC METABOLIC PANEL
BUN/Creatinine Ratio: 9 (ref 9–23)
BUN: 7 mg/dL (ref 6–20)
CO2: 19 mmol/L — ABNORMAL LOW (ref 20–29)
Calcium: 9.5 mg/dL (ref 8.7–10.2)
Chloride: 103 mmol/L (ref 96–106)
Creatinine, Ser: 0.79 mg/dL (ref 0.57–1.00)
GFR calc Af Amer: 118 mL/min/{1.73_m2} (ref >59)
GFR calc non Af Amer: 102 mL/min/{1.73_m2} (ref >59)
Glucose: 81 mg/dL (ref 65–99)
Potassium: 4.3 mmol/L (ref 3.5–5.2)
Sodium: 137 mmol/L (ref 134–144)

## 2019-07-26 LAB — CBC
Hematocrit: 39.9 % (ref 34.0–46.6)
Hemoglobin: 13.8 g/dL (ref 11.1–15.9)
MCH: 30 pg (ref 26.6–33.0)
MCHC: 34.6 g/dL (ref 31.5–35.7)
MCV: 87 fL (ref 79–97)
Platelets: 217 10*3/uL (ref 150–450)
RBC: 4.6 x10E6/uL (ref 3.77–5.28)
RDW: 12.9 % (ref 11.7–15.4)
WBC: 7.5 10*3/uL (ref 3.4–10.8)

## 2019-07-28 LAB — CYTOLOGY - PAP
Comment: NEGATIVE
Diagnosis: UNDETERMINED — AB
High risk HPV: NEGATIVE

## 2019-07-30 ENCOUNTER — Other Ambulatory Visit: Payer: Self-pay | Admitting: Nurse Practitioner

## 2019-07-30 MED ORDER — METRONIDAZOLE 500 MG PO TABS: 500.0000 mg | ORAL_TABLET | Freq: Two times a day (BID) | ORAL | 0 refills | Status: AC

## 2019-07-31 MED FILL — ?METRONIDAZOLE 500 MG TABS: 500 | 7 days supply | Qty: 14 | Fill #0

## 2019-08-07 ENCOUNTER — Ambulatory Visit: Payer: Self-pay | Admitting: Nurse Practitioner

## 2019-08-11 MED FILL — NUVARING VAGINAL RING: 0.12-0.015 | 28 days supply | Qty: 1 | Fill #9

## 2019-09-07 MED FILL — NUVARING VAGINAL RING: 0.12-0.015 | 28 days supply | Qty: 1 | Fill #10

## 2019-12-05 ENCOUNTER — Other Ambulatory Visit: Payer: Self-pay

## 2019-12-05 ENCOUNTER — Other Ambulatory Visit: Payer: Self-pay | Admitting: Nurse Practitioner

## 2019-12-05 ENCOUNTER — Encounter: Payer: Self-pay | Admitting: Nurse Practitioner

## 2019-12-05 ENCOUNTER — Ambulatory Visit: Payer: Self-pay | Admitting: Nurse Practitioner

## 2019-12-05 DIAGNOSIS — Z30018 Encounter for initial prescription of other contraceptives: Secondary | ICD-10-CM

## 2019-12-05 DIAGNOSIS — R5383 Other fatigue: Secondary | ICD-10-CM

## 2019-12-05 DIAGNOSIS — E669 Obesity, unspecified: Secondary | ICD-10-CM

## 2019-12-05 DIAGNOSIS — N939 Abnormal uterine and vaginal bleeding, unspecified: Secondary | ICD-10-CM

## 2019-12-05 DIAGNOSIS — Z131 Encounter for screening for diabetes mellitus: Secondary | ICD-10-CM

## 2019-12-05 MED ORDER — ETONOGESTREL-ETHINYL ESTRADIOL 0.12-0.015 MG/24HR VA RING: VAGINAL_RING | VAGINAL | 12 refills | Status: DC

## 2019-12-05 MED ORDER — PHENTERMINE HCL 37.5 MG PO TABS: 37.5000 mg | ORAL_TABLET | Freq: Every day | ORAL | 1 refills | Status: DC

## 2019-12-05 NOTE — Progress Notes (Signed)
Virtual Visit via Telephone Note Due to national recommendations of social distancing due to [place] 19, telehealth visit is felt to be most appropriate for this patient at this time.  I discussed the limitations, risks, security and privacy concerns of performing an evaluation and management service by telephone and the availability of in person appointments. I also discussed with the patient that there may be a patient responsible charge related to this service. The patient expressed understanding and agreed to proceed.    I connected with Rhonda Avila on 12/05/19  at   3:50 PM EDT  EDT by telephone and verified that I am speaking with the correct person using two identifiers.   Consent I discussed the limitations, risks, security and privacy concerns of performing an evaluation and management service by telephone and the availability of in person appointments. I also discussed with the patient that there may be a patient responsible charge related to this service. The patient expressed understanding and agreed to proceed.   Location of Patient: Private  Residence   Location of Provider: [facility] [employer] Office    Persons participating in Telemedicine visit: Geryl Rankins FNP-BC [place]    History of Present Illness: Telemedicine visit for: Multiple concerns  States her gums seem more discolored and pale. She does not see  Dentist. Has been waiting to be seen at one of the local free dental clinics. Denies any dental or gum pain.   AUB Requesting serum pregnancy test. She does endorse using her birth control (nuva ring) as prescribed. She had a menstrual cycle on a Sunday last month and then on a Saturday the month before. I have instructed her that using a hormonal form of birth control could potentially cause her to have irregular cycles however a one day difference between the past 2 months would not be considered  irregular.   Associated symptoms include mittleschmertz one week prior to her cycle and a week or so after her cycle    GU symptom Feels pressure in the rectal area. Has a history of external hemorrhoid. Noted onset after giving birth last year. Pressure sensation makes her feel like she has to have a bowel movement however when she attempts to have a bowel movement she is unable to. Denies any constipation. Will have her return to the office for rectal exam.   Obesity Requesting to restart phentermine for weight loss. She does not have a routine exercise routine but reports she is active at work. She has lost about 7lbs over the past several months. I have recommended she increase her activity outside of work.    Past Medical History:  Diagnosis Date  . HSV (herpes simplex virus) infection   . Medical history non-contributory   . Pregnancy induced hypertension     Past Surgical History:  Procedure Laterality Date  . NO PAST SURGERIES      Family History  Problem Relation Age of Onset  . Hypertension Maternal Grandmother   . Hypertension Mother     Social History   Socioeconomic History  . Marital status: Single    Spouse name: Not on file  . Number of children: Not on file  . Years of education: Not on file  . Highest education level: Not on file  Occupational History  . Not on file  Tobacco Use  . Smoking status: Never Smoker  . Smokeless tobacco: Never Used  Substance and Sexual Activity  . Alcohol  use: No  . Drug use: Not Currently  . Sexual activity: Yes    Birth control/protection: Pill  Other Topics Concern  . Not on file  Social History Narrative  . Not on file   Social Determinants of Health   Financial Resource Strain:   . Difficulty of Paying Living Expenses:   Food Insecurity:   . Worried About Charity fundraiser in the Last Year:   . Arboriculturist in the Last Year:   Transportation Needs:   . Film/video editor (Medical):   Marland Kitchen Lack of  Transportation (Non-Medical):   Physical Activity:   . Days of Exercise per Week:   . Minutes of Exercise per Session:   Stress:   . Feeling of Stress :   Social Connections:   . Frequency of Communication with Friends and Family:   . Frequency of Social Gatherings with Friends and Family:   . Attends Religious Services:   . Active Member of Clubs or Organizations:   . Attends Archivist Meetings:   Marland Kitchen Marital Status:      Observations/Objective: Awake, alert and oriented x 3   Review of Systems  Constitutional: Negative for fever, malaise/fatigue and weight loss.  HENT: Negative for nosebleeds.        SEE HPI  Eyes: Negative.  Negative for blurred vision, double vision and photophobia.  Respiratory: Negative.  Negative for cough and shortness of breath.   Cardiovascular: Negative.  Negative for chest pain, palpitations and leg swelling.  Gastrointestinal: Negative.  Negative for heartburn, nausea and vomiting.  Musculoskeletal: Negative.  Negative for myalgias.  Neurological: Negative.  Negative for dizziness, focal weakness, seizures and headaches.  Psychiatric/Behavioral: Negative.  Negative for suicidal ideas.    Assessment and Plan: Rhonda Avila was seen today for flank pain and medication refill.  Diagnoses and all orders for this visit:  Encounter for prescription for nuvaring -     etonogestrel-ethinyl estradiol (NUVARING) 0.12-0.015 MG/24HR vaginal ring; Insert vaginally and leave in place for 3 consecutive weeks, then remove for 1 week.  Obesity, Class II, BMI 35-39.9 -     Lipid panel; Future -     TSH; Future -     phentermine (ADIPEX-P) 37.5 MG tablet; Take 1 tablet (37.5 mg total) by mouth daily before breakfast.   Fatigue, unspecified type -     CBC; Future -     CMP14+EGFR; Future Encouraged to take a daily MVI  Encounter for screening for diabetes mellitus -     Hemoglobin A1c; Future     Follow Up Instructions Return in about 4 weeks  (around 01/02/2020) for Rectal exam.     I discussed the assessment and treatment plan with the patient. The patient was provided an opportunity to ask questions and all were answered. The patient agreed with the plan and demonstrated an understanding of the instructions.   The patient was advised to call back or seek an in-person evaluation if the symptoms worsen or if the condition fails to improve as anticipated.  I provided 17 minutes of non-face-to-face time during this encounter including median intraservice time, reviewing previous notes, labs, imaging, medications and explaining diagnosis and management.  Gildardo Pounds, FNP-BC

## 2019-12-06 ENCOUNTER — Ambulatory Visit: Payer: Self-pay

## 2019-12-06 ENCOUNTER — Other Ambulatory Visit: Payer: Self-pay

## 2019-12-06 DIAGNOSIS — R5383 Other fatigue: Secondary | ICD-10-CM

## 2019-12-06 DIAGNOSIS — E669 Obesity, unspecified: Secondary | ICD-10-CM

## 2019-12-06 DIAGNOSIS — N939 Abnormal uterine and vaginal bleeding, unspecified: Secondary | ICD-10-CM

## 2019-12-06 DIAGNOSIS — Z131 Encounter for screening for diabetes mellitus: Secondary | ICD-10-CM

## 2019-12-06 MED FILL — NUVARING VAGINAL RING: 0.12-0.015 | 28 days supply | Qty: 1 | Fill #0

## 2019-12-07 LAB — CMP14+EGFR
ALT: 63 IU/L — ABNORMAL HIGH (ref 0–32)
AST: 41 IU/L — ABNORMAL HIGH (ref 0–40)
Albumin/Globulin Ratio: 1.4 (ref 1.2–2.2)
Albumin: 4.1 g/dL (ref 3.9–5.0)
Alkaline Phosphatase: 88 IU/L (ref 48–121)
BUN/Creatinine Ratio: 12 (ref 9–23)
BUN: 8 mg/dL (ref 6–20)
Bilirubin Total: 0.2 mg/dL (ref 0.0–1.2)
CO2: 24 mmol/L (ref 20–29)
Calcium: 9.3 mg/dL (ref 8.7–10.2)
Chloride: 102 mmol/L (ref 96–106)
Creatinine, Ser: 0.67 mg/dL (ref 0.57–1.00)
GFR calc Af Amer: 138 mL/min/{1.73_m2} (ref >59)
GFR calc non Af Amer: 120 mL/min/{1.73_m2} (ref >59)
Globulin, Total: 3 g/dL (ref 1.5–4.5)
Glucose: 74 mg/dL (ref 65–99)
Potassium: 3.9 mmol/L (ref 3.5–5.2)
Sodium: 140 mmol/L (ref 134–144)
Total Protein: 7.1 g/dL (ref 6.0–8.5)

## 2019-12-07 LAB — CBC
Hematocrit: 38.6 % (ref 34.0–46.6)
Hemoglobin: 13.5 g/dL (ref 11.1–15.9)
MCH: 30.3 pg (ref 26.6–33.0)
MCHC: 35 g/dL (ref 31.5–35.7)
MCV: 87 fL (ref 79–97)
Platelets: 253 10*3/uL (ref 150–450)
RBC: 4.45 x10E6/uL (ref 3.77–5.28)
RDW: 12.2 % (ref 11.7–15.4)
WBC: 7.6 10*3/uL (ref 3.4–10.8)

## 2019-12-07 LAB — LIPID PANEL
Chol/HDL Ratio: 2.9 ratio (ref 0.0–4.4)
Cholesterol, Total: 215 mg/dL — ABNORMAL HIGH (ref 100–199)
HDL: 73 mg/dL (ref >39)
LDL Chol Calc (NIH): 121 mg/dL — ABNORMAL HIGH (ref 0–99)
Triglycerides: 118 mg/dL (ref 0–149)
VLDL Cholesterol Cal: 21 mg/dL (ref 5–40)

## 2019-12-07 LAB — HUMAN CHORIONIC GONADOTROPIN(HCG),B-SUBUNIT,QUANTITATIVE): HCG, Beta Chain, Quant, S: <1 m[IU]/mL

## 2019-12-07 LAB — TSH: TSH: 0.976 u[IU]/mL (ref 0.450–4.500)

## 2019-12-07 LAB — HEMOGLOBIN A1C
Est. average glucose Bld gHb Est-mCnc: 100 mg/dL
Hgb A1c MFr Bld: 5.1 % (ref 4.8–5.6)

## 2020-01-10 ENCOUNTER — Ambulatory Visit: Payer: Self-pay | Admitting: Nurse Practitioner

## 2020-01-15 MED FILL — NUVARING VAGINAL RING: 0.12-0.015 | 28 days supply | Qty: 1 | Fill #1

## 2020-02-20 ENCOUNTER — Ambulatory Visit: Payer: Self-pay | Admitting: Nurse Practitioner

## 2020-02-21 MED FILL — NUVARING 0.12-0.015 MG/24HR: 0.12-0.015 | 28 days supply | Qty: 1 | Fill #2

## 2020-03-01 ENCOUNTER — Ambulatory Visit: Payer: Self-pay | Admitting: Family Medicine

## 2020-03-27 MED FILL — NUVARING 0.12-0.015 MG/24HR: 0.12-0.015 | 28 days supply | Qty: 1 | Fill #3

## 2020-04-01 ENCOUNTER — Other Ambulatory Visit: Payer: Self-pay | Admitting: Family

## 2020-04-01 ENCOUNTER — Other Ambulatory Visit: Payer: Self-pay

## 2020-04-01 ENCOUNTER — Ambulatory Visit: Payer: Self-pay | Attending: Family | Admitting: Family

## 2020-04-01 VITALS — BP 118/75 | HR 80 | Ht 62.0 in | Wt 207.6 lb

## 2020-04-01 DIAGNOSIS — I10 Essential (primary) hypertension: Secondary | ICD-10-CM

## 2020-04-01 DIAGNOSIS — A6004 Herpesviral vulvovaginitis: Secondary | ICD-10-CM

## 2020-04-01 MED ORDER — VALACYCLOVIR HCL 1 G PO TABS: 1000.0000 mg | ORAL_TABLET | Freq: Every day | ORAL | 0 refills | Status: DC

## 2020-04-01 NOTE — Progress Notes (Signed)
Patient had elevated BP at last 2 dentist appointments.  Had problems with BP while pregnant.

## 2020-04-01 NOTE — Progress Notes (Addendum)
Patient ID: Rhonda Avila, female    DOB: 1990-12-04  MRN: [id number]  CC: Hypertension Follow-Up  Subjective: Rhonda Avila is a 29 y.o. female with history of hypertension, and polycystic ovarian syndrome who presents for hypertension follow-up.  1. BLOOD PRESSURE FOLLOW-UP: Currently taking: not prescribed any medications  Adherence with salt restriction: [x]  Yes    []  No Exercise: Yes []  No [x]  Home Monitoring?: [x]  Yes    []  No Home BP results range: 132/80 Smoking []  Yes [x]  No SOB? []  Yes    []  No Chest Pain?: []  Yes    [x]  No Leg swelling?: []  Yes    [x]  No Headaches?: [x]  Yes, over-the-counter migraine pills and helps Dizziness? []  Yes    []  No Was taking Lisinopril beginning at 6 weeks postpartum in January/February 2020 through 3 months postpartum.   2. RASH VAGINAL AREA: Began 3 weeks ago near vaginal area . Bumps look irritated, itching, bleeding from scratching so much.    Patient Active Problem List   Diagnosis Date Noted  . Hypertension 07/20/2018  . UTI (urinary tract infection) during pregnancy, first trimester 01/10/2018  . PCOS (polycystic ovarian syndrome) 01/03/2018     Current Outpatient Medications on File Prior to Visit  Medication Sig Dispense Refill  . etonogestrel-ethinyl estradiol (NUVARING) 0.12-0.015 MG/24HR vaginal ring Insert vaginally and leave in place for 3 consecutive weeks, then remove for 1 week. 1 each 12  . nystatin cream (MYCOSTATIN) Apply 1 application topically 2 (two) times daily. (Patient not taking: Reported on 04/01/2020) 60 g 1  . phentermine (ADIPEX-P) 37.5 MG tablet Take 1 tablet (37.5 mg total) by mouth daily before breakfast. 30 tablet 1   No current facility-administered medications on file prior to visit.    No Known Allergies  Social History   Socioeconomic History  . Marital status: Single    Spouse name: Not on file  . Number of children: Not on file  . Years of education: Not on  file  . Highest education level: Not on file  Occupational History  . Not on file  Tobacco Use  . Smoking status: Never Smoker  . Smokeless tobacco: Never Used  Substance and Sexual Activity  . Alcohol use: No  . Drug use: Not Currently  . Sexual activity: Yes    Birth control/protection: Pill  Other Topics Concern  . Not on file  Social History Narrative  . Not on file   Social Determinants of Health   Financial Resource Strain:   . Difficulty of Paying Living Expenses: Not on file  Food Insecurity:   . Worried About in the Last Year: Not on file  . Ran Out of Food in the Last Year: Not on file  Transportation Needs:   . Lack of Transportation (Medical): Not on file  . Lack of Transportation (Non-Medical): Not on file  Physical Activity:   . Days of Exercise per Week: Not on file  . Minutes of Exercise per Session: Not on file  Stress:   . Feeling of Stress : Not on file  Social Connections:   . Frequency of Communication with Friends and Family: Not on file  . Frequency of Social Gatherings with Friends and Family: Not on file  . Attends Religious Services: Not on file  . Active Member of Clubs or Organizations: Not on file  . Attends Meetings: Not on file  . Marital Status: Not on file  Intimate Partner Violence:   . Fear of Current or Ex-Partner: Not on file  . Emotionally Abused: Not on file  . Physically Abused: Not on file  . Sexually Abused: Not on file    Family History  Problem Relation Age of Onset  . Hypertension Maternal Grandmother   . Hypertension Mother     Past Surgical History:  Procedure Laterality Date  . NO PAST SURGERIES      ROS: Review of Systems Negative except as stated above  PHYSICAL EXAM: BP 118/75   Pulse 80   Ht 5\' 2"  (1.575 m)   Wt 207 lb 9.6 oz (94.2 kg)   SpO2 99%   BMI 37.97 kg/m   Physical Exam General appearance - alert, well appearing, and in no distress and oriented  to person, place, and time Neck - supple, no significant adenopathy Lymphatics - no palpable lymphadenopathy, no hepatosplenomegaly Chest - clear to auscultation, no wheezes, rales or rhonchi, symmetric air entry, no tachypnea, retractions or cyanosis Heart - normal rate, regular rhythm, normal S1, S2, no murmurs, rubs, clicks or gallops Pelvic - exam declined by the patient Neurological - alert, oriented, normal speech, no focal findings or movement disorder noted Skin - exam declined by the patient   ASSESSMENT AND PLAN: 1. Essential hypertension: - Patient with history of hypertension after giving birth in 2020. Was on Lisinopril for about 3 months during that time and none since then.  - Blood pressure today normal. Discussed that normal blood pressure would be less than 120/80. - Encouraged patient to keep blood pressure diary and to follow-up with primary provider in 4 to 6 weeks. Patient verbalized understanding.    2. 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  - Valacyclovir for likely herpes infection outbreak. Not currently breastfeeding. - Follow-up with primary provider if symptoms do not improve or worsens. Patient verbalized understanding. - valACYclovir (VALTREX) 1000 MG tablet; Take 1 tablet (1,000 mg total) by mouth daily for 5 days.  Dispense: 5 tablet; Refill: 0  Patient was given the opportunity to ask questions.  Patient verbalized understanding of the plan and was able to repeat key elements of the plan. Patient was given clear instructions to go to Emergency Department or return to medical center if symptoms don't improve, worsen, or new problems develop.The patient verbalized understanding.    Requested Prescriptions   Signed Prescriptions Disp Refills  . valACYclovir (VALTREX) 1000 MG tablet 5 tablet 0    Sig: Take 1 tablet (1,000 mg total) by mouth daily for 5 days.    2021, NP

## 2020-04-01 NOTE — Patient Instructions (Addendum)
Monitor blood pressure at home and bring to next appointment with primary provider. Valtrex for herpes outbreak. Follow-up as needed. Hypertension, Adult Hypertension is another name for high blood pressure. High blood pressure forces your heart to work harder to pump blood. This can cause problems over time. There are two numbers in a blood pressure reading. There is a top number (systolic) over a bottom number (diastolic). It is best to have a blood pressure that is below 120/80. Healthy choices can help lower your blood pressure, or you may need medicine to help lower it. What are the causes? The cause of this condition is not known. Some conditions may be related to high blood pressure. What increases the risk?  Smoking.  Having type 2 diabetes mellitus, high cholesterol, or both.  Not getting enough exercise or physical activity.  Being overweight.  Having too much fat, sugar, calories, or salt (sodium) in your diet.  Drinking too much alcohol.  Having long-term (chronic) kidney disease.  Having a family history of high blood pressure.  Age. Risk increases with age.  Race. You may be at higher risk if you are African American.  Gender. Men are at higher risk than women before age 60. After age 27, women are at higher risk than men.  Having obstructive sleep apnea.  Stress. What are the signs or symptoms?  High blood pressure may not cause symptoms. Very high blood pressure (hypertensive crisis) may cause: ? Headache. ? Feelings of worry or nervousness (anxiety). ? Shortness of breath. ? Nosebleed. ? A feeling of being sick to your stomach (nausea). ? Throwing up (vomiting). ? Changes in how you see. ? Very bad chest pain. ? Seizures. How is this treated?  This condition is treated by making healthy lifestyle changes, such as: ? Eating healthy foods. ? Exercising more. ? Drinking less alcohol.  Your health care provider may prescribe medicine if lifestyle  changes are not enough to get your blood pressure under control, and if: ? Your top number is above 130. ? Your bottom number is above 80.  Your personal target blood pressure may vary. Follow these instructions at home: Eating and drinking   If told, follow the DASH eating plan. To follow this plan: ? Fill one half of your plate at each meal with fruits and vegetables. ? Fill one fourth of your plate at each meal with whole grains. Whole grains include whole-wheat pasta, brown rice, and whole-grain bread. ? Eat or drink low-fat dairy products, such as skim milk or low-fat yogurt. ? Fill one fourth of your plate at each meal with low-fat (lean) proteins. Low-fat proteins include fish, chicken without skin, eggs, beans, and tofu. ? Avoid fatty meat, cured and processed meat, or chicken with skin. ? Avoid pre-made or processed food.  Eat less than 1,500 mg of salt each day.  Do not drink alcohol if: ? Your doctor tells you not to drink. ? You are pregnant, may be pregnant, or are planning to become pregnant.  If you drink alcohol: ? Limit how much you use to:  0-1 drink a day for women.  0-2 drinks a day for men. ? Be aware of how much alcohol is in your drink. In the [place] one drink equals one 12 oz bottle of beer (355 mL), one 5 oz glass of wine (148 mL), or one 1 oz glass of hard liquor (44 mL). Lifestyle   Work with your doctor to stay at a healthy weight or to lose  weight. Ask your doctor what the best weight is for you.  Get at least 30 minutes of exercise most days of the week. This may include walking, swimming, or biking.  Get at least 30 minutes of exercise that strengthens your muscles (resistance exercise) at least 3 days a week. This may include lifting weights or doing Pilates.  Do not use any products that contain nicotine or tobacco, such as cigarettes, e-cigarettes, and chewing tobacco. If you need help quitting, ask your doctor.  Check your blood pressure at  home as told by your doctor.  Keep all follow-up visits as told by your doctor. This is important. Medicines  Take over-the-counter and prescription medicines only as told by your doctor. Follow directions carefully.  Do not skip doses of blood pressure medicine. The medicine does not work as well if you skip doses. Skipping doses also puts you at risk for problems.  Ask your doctor about side effects or reactions to medicines that you should watch for. Contact a doctor if you:  Think you are having a reaction to the medicine you are taking.  Have headaches that keep coming back (recurring).  Feel dizzy.  Have swelling in your ankles.  Have trouble with your vision. Get help right away if you:  Get a very bad headache.  Start to feel mixed up (confused).  Feel weak or numb.  Feel faint.  Have very bad pain in your: ? Chest. ? Belly (abdomen).  Throw up more than once.  Have trouble breathing. Summary  Hypertension is another name for high blood pressure.  High blood pressure forces your heart to work harder to pump blood.  For most people, a normal blood pressure is less than 120/80.  Making healthy choices can help lower blood pressure. If your blood pressure does not get lower with healthy choices, you may need to take medicine. This information is not intended to replace advice given to you by your health care provider. Make sure you discuss any questions you have with your health care provider. Document Revised: 02/09/2018 Document Reviewed: 02/09/2018 Elsevier Patient Education  2020 [employer].

## 2020-04-02 MED FILL — valACYclovir HCL 1 GM TABS: 1 | 5 days supply | Qty: 5 | Fill #0

## 2020-04-18 ENCOUNTER — Other Ambulatory Visit (HOSPITAL_COMMUNITY)
Admission: RE | Admit: 2020-04-18 | Discharge: 2020-04-18 | Disposition: A | Discharge status: Home / Self Care | Payer: Self-pay | Source: Ambulatory Visit | Attending: Physician Assistant | Admitting: Physician Assistant

## 2020-04-18 ENCOUNTER — Ambulatory Visit: Payer: Self-pay | Attending: Physician Assistant | Admitting: Physician Assistant

## 2020-04-18 ENCOUNTER — Encounter: Payer: Self-pay | Admitting: Physician Assistant

## 2020-04-18 ENCOUNTER — Other Ambulatory Visit: Payer: Self-pay

## 2020-04-18 ENCOUNTER — Other Ambulatory Visit: Payer: Self-pay | Admitting: Physician Assistant

## 2020-04-18 VITALS — BP 123/74 | HR 87 | Ht 62.0 in | Wt 205.8 lb

## 2020-04-18 DIAGNOSIS — N898 Other specified noninflammatory disorders of vagina: Secondary | ICD-10-CM | POA: Insufficient documentation

## 2020-04-18 DIAGNOSIS — R21 Rash and other nonspecific skin eruption: Secondary | ICD-10-CM

## 2020-04-18 MED ORDER — VALACYCLOVIR HCL 1 G PO TABS: 1000.0000 mg | ORAL_TABLET | Freq: Every day | ORAL | 1 refills | Status: DC

## 2020-04-18 MED FILL — valACYclovir HCL 1 GM TABS: 1 | 90 days supply | Qty: 90 | Fill #0

## 2020-04-18 NOTE — Progress Notes (Signed)
Patient ID: Rhonda Avila, female   DOB: 29-Jul-1990, 29 y.o.   MRN: [id number]     Rhonda Avila, is a 29 y.o. female  PPI:[id number]  AYT:[id number]  DOB - 21-May-1991  Subjective:  Chief Complaint and HPI: Rhonda Avila is a 29 y.o. female here today Rash for about 6 weeks on upper inner L thigh.  Itches and burns and even bleeds at times.  Improved about 90% after starting valtrex at last OV.  No fever.  No vaginal discharge but she does have a vaginal odor.  No pelvic pain.  Same partner X 6  years. He has not had a rash or any issues.  Uses vaginal contraception ring.    ROS:   Constitutional:  No f/c, No night sweats, No unexplained weight loss. EENT:  No vision changes, No blurry vision, No hearing changes. No mouth, throat, or ear problems.  Respiratory: No cough, No SOB Cardiac: No CP, no palpitations GI:  No abd pain, No N/V/D. GU: No Urinary s/sx Musculoskeletal: No joint pain Neuro: No headache, no dizziness, no motor weakness.  Skin: No rash Endocrine:  No polydipsia. No polyuria.  Psych: Denies SI/HI  No problems updated.  ALLERGIES: No Known Allergies  PAST MEDICAL HISTORY: Past Medical History:  Diagnosis Date  . HSV (herpes simplex virus) infection   . Medical history non-contributory   . Pregnancy induced hypertension     MEDICATIONS AT HOME: Prior to Admission medications   Medication Sig Start Date End Date Taking? Authorizing Provider  etonogestrel-ethinyl estradiol (NUVARING) 0.12-0.015 MG/24HR vaginal ring Insert vaginally and leave in place for 3 consecutive weeks, then remove for 1 week. 12/05/19  Yes Claiborne Rigg, NP  nystatin cream (MYCOSTATIN) Apply 1 application topically 2 (two) times daily. Patient not taking: Reported on 04/01/2020 05/23/19   Claiborne Rigg, NP  phentermine (ADIPEX-P) 37.5 MG tablet Take 1 tablet (37.5 mg total) by mouth daily before breakfast. 12/05/19 01/04/20  Claiborne Rigg, NP    valACYclovir (VALTREX) 1000 MG tablet Take 1 tablet (1,000 mg total) by mouth daily. 04/18/20   Anders Simmonds, PA-C     Objective:  EXAM:   Vitals:   04/18/20 1356  BP: 123/74  Pulse: 87  SpO2: 99%  Weight: 205 lb 12.8 oz (93.4 kg)  Height: 5\' 2"  (1.575 m)    General appearance : A&OX3. NAD. Non-toxic-appearing HEENT: Atraumatic and Normocephalic.  PERRLA. EOM intact.   Chest/Lungs:  Breathing-non-labored, Good air entry bilaterally, breath sounds normal without rales, rhonchi, or wheezing  CVS: S1 S2 regular, no murmurs, gallops, rubs  GU:  external genitalia with one resolving red lesion/ulceration(~31mm) on L labia with some surrounding hyperpigmentation.  Otherwise external genitalia WNL.  Speculum inserted.  No abnormal discharge.  Swab of the cervix taken.  Bimanual unremarkable;  No CMT or adnexal tenderness/fullness.  Extremities: Bilateral Lower Ext shows no edema, both legs are warm to touch with = pulse throughout Neurology:  CN II-XII grossly intact, Non focal.   Psych:  TP linear. J/I WNL. Normal speech. Appropriate eye contact and affect.  Skin:  No Rash  Data Review Lab Results  Component Value Date   HGBA1C 5.1 12/06/2019   HGBA1C 4.9 01/03/2018     Assessment & Plan   1. Rash and nonspecific skin eruption Concern for resolving HSV 1 bc responded to valtrex.   - HSV(herpes simplex vrs) 1+2 ab-IgG - valACYclovir (VALTREX) 1000 MG tablet; Take 1 tablet (1,000 mg total)  by mouth daily.  Dispense: 90 tablet; Refill: 1  2. Vaginal odor - Cervicovaginal ancillary only   Patient have been counseled extensively about nutrition and exercise  Return if symptoms worsen or fail to improve.  The patient was given clear instructions to go to ER or return to medical center if symptoms don't improve, worsen or new problems develop. The patient verbalized understanding. The patient was told to call to get lab results if they haven't heard anything in the next week.      Georgian Co, PA-C [facility] [place], [place] [phone]   04/18/2020, 3:16 PM

## 2020-04-19 LAB — HSV(HERPES SIMPLEX VRS) I + II AB-IGG
HSV 1 Glycoprotein G Ab, IgG: <0.91 index (ref 0.00–0.90)
HSV 2 IgG, Type Spec: >23.6 index — ABNORMAL HIGH (ref 0.00–0.90)

## 2020-04-22 LAB — CERVICOVAGINAL ANCILLARY ONLY
Bacterial Vaginitis (gardnerella): POSITIVE — AB
Candida Glabrata: NEGATIVE
Candida Vaginitis: NEGATIVE
Chlamydia: NEGATIVE
Comment: NEGATIVE
Comment: NEGATIVE
Comment: NEGATIVE
Comment: NEGATIVE
Comment: NEGATIVE
Comment: NORMAL
Neisseria Gonorrhea: NEGATIVE
Trichomonas: NEGATIVE

## 2020-04-23 MED FILL — NUVARING 0.12-0.015 MG/24HR: 0.12-0.015 | 84 days supply | Qty: 3 | Fill #4

## 2020-04-24 ENCOUNTER — Other Ambulatory Visit: Payer: Self-pay | Admitting: Physician Assistant

## 2020-04-24 MED ORDER — METRONIDAZOLE 500 MG PO TABS
500.0000 mg | ORAL_TABLET | Freq: Two times a day (BID) | ORAL | 0 refills | Status: DC
Start: 2020-04-24 — End: 2020-07-29

## 2020-04-24 MED ORDER — FLUCONAZOLE 150 MG PO TABS
150.0000 mg | ORAL_TABLET | Freq: Once | ORAL | 0 refills | Status: DC
Start: 2020-04-24 — End: 2020-04-24

## 2020-04-24 MED FILL — ?METRONIDAZOLE 500 MG TABS: 500 | 7 days supply | Qty: 14 | Fill #0

## 2020-04-24 MED FILL — FLUCONAZOLE 150 MG TABLET: 150 | 1 days supply | Qty: 1 | Fill #0

## 2020-05-28 ENCOUNTER — Telehealth: Payer: Self-pay | Admitting: Nurse Practitioner

## 2020-05-28 NOTE — Telephone Encounter (Signed)
Called patient and voicemail was full so could not leave a message. If patient calls back, I Was calling to let patient know that Rhonda Avila is booked for next week so we will not be able to schedule an appointment until Monday 06/03/2020 for financial counseling. Patient can come into the office and pick up the application.

## 2020-05-28 NOTE — Telephone Encounter (Signed)
Patient called back and was informed.

## 2020-07-03 ENCOUNTER — Ambulatory Visit: Payer: Self-pay | Attending: Nurse Practitioner

## 2020-07-03 ENCOUNTER — Other Ambulatory Visit: Payer: Self-pay

## 2020-07-09 MED FILL — NUVARING 0.12-0.015 MG/24HR: 0.12-0.015 | 84 days supply | Qty: 3 | Fill #5

## 2020-07-29 ENCOUNTER — Ambulatory Visit: Payer: Self-pay | Attending: Nurse Practitioner | Admitting: Nurse Practitioner

## 2020-07-29 ENCOUNTER — Other Ambulatory Visit: Payer: Self-pay | Admitting: Nurse Practitioner

## 2020-07-29 ENCOUNTER — Encounter: Payer: Self-pay | Admitting: Nurse Practitioner

## 2020-07-29 ENCOUNTER — Other Ambulatory Visit: Payer: Self-pay

## 2020-07-29 VITALS — BP 131/87 | HR 78 | Temp 97.8°F | Ht 62.0 in | Wt 215.0 lb

## 2020-07-29 DIAGNOSIS — Z114 Encounter for screening for human immunodeficiency virus [HIV]: Secondary | ICD-10-CM

## 2020-07-29 DIAGNOSIS — A6004 Herpesviral vulvovaginitis: Secondary | ICD-10-CM

## 2020-07-29 DIAGNOSIS — E669 Obesity, unspecified: Secondary | ICD-10-CM

## 2020-07-29 DIAGNOSIS — R21 Rash and other nonspecific skin eruption: Secondary | ICD-10-CM

## 2020-07-29 DIAGNOSIS — N939 Abnormal uterine and vaginal bleeding, unspecified: Secondary | ICD-10-CM

## 2020-07-29 LAB — POCT URINE PREGNANCY: Preg Test, Ur: NEGATIVE

## 2020-07-29 MED ORDER — VALACYCLOVIR HCL 1 G PO TABS: 1000.0000 mg | ORAL_TABLET | Freq: Every day | ORAL | 1 refills | Status: DC

## 2020-07-29 MED ORDER — PHENTERMINE HCL 37.5 MG PO TABS: 37.5000 mg | ORAL_TABLET | Freq: Every day | ORAL | 1 refills | Status: DC

## 2020-07-29 MED ORDER — ACYCLOVIR 400 MG PO TABS: 400.0000 mg | ORAL_TABLET | Freq: Two times a day (BID) | ORAL | 0 refills | Status: DC

## 2020-07-29 MED FILL — valACYclovir HCL 1 GM TABS: 1 | 90 days supply | Qty: 90 | Fill #0

## 2020-07-29 MED FILL — ACYCLOVIR 400 MG TABLET: 400 | 10 days supply | Qty: 20 | Fill #0

## 2020-07-29 NOTE — Progress Notes (Signed)
Assessment & Plan:  Lakelyn was seen today for rash.  Diagnoses and all orders for this visit:  Abnormal uterine bleeding (AUB) -     POCT urine pregnancy  Rash and nonspecific skin eruption -     Discontinue: valACYclovir (VALTREX) 1000 MG tablet; Take 1 tablet (1,000 mg total) by mouth daily. -     Cervicovaginal ancillary only -     NuSwab Vaginitis Plus (VG+)  Obesity, Class II, BMI 35-39.9 -     phentermine (ADIPEX-P) 37.5 MG tablet; Take 1 tablet (37.5 mg total) by mouth daily before breakfast. Discussed diet and exercise for person with BMI >39. Instructed: You must burn more calories than you eat. Losing 5 percent of your body weight should be considered a success. In the longer term, losing more than 15 percent of your body weight and staying at this weight is an extremely good result. However, keep in mind that even losing 5 percent of your body weight leads to important health benefits, so try not to get discouraged if you're not able to lose more than this. 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    Patient has been counseled on age-appropriate routine health concerns for screening and prevention. These are reviewed and up-to-date. Referrals have been placed accordingly. Immunizations are up-to-date or declined.    Subjective:   Chief Complaint  Patient presents with  . Rash    Patient is still having a rash on her upper inner thighs.    HPI Rhonda Avila 30 y.o. female presents to office today with complaints of painful lesions between the fold in the right groin area. She does have a history of HSV 2 and states she was prescribed medication in the past (valtrex) however the lesions did not completely resolve.   She has also  been experiencing abnormal uterine bleeding. States menstrual cycle is now occurring a day later and lasting several days instead of 1 day. I have instructed her that weight fluctuations can cause irregular bleeding with certain contraceptives.  Wt Readings from Last 3 Encounters:  07/29/20 215 lb (97.5 kg)  04/18/20 205 lb 12.8 oz (93.4 kg)  04/01/20 207 lb 9.6 oz (94.2 kg)    Obesity Would like to try weight loss medication. She has tried phentermine in the past and would to try again. BMI 39    Review of Systems  Constitutional: Negative for fever, malaise/fatigue and weight loss.  HENT: Negative.  Negative for nosebleeds.   Eyes: Negative.  Negative for blurred vision, double vision and photophobia.  Respiratory: Positive for cough (intermittent and infrequent). Negative for shortness of breath.   Cardiovascular: Negative.  Negative for chest pain, palpitations and leg swelling.  Gastrointestinal: Negative.  Negative for heartburn, nausea and vomiting.  Genitourinary:       SEE HPI  Musculoskeletal: Negative.  Negative for myalgias.  Neurological: Negative.  Negative for dizziness, focal weakness, seizures and headaches.  Psychiatric/Behavioral: Negative.  Negative for suicidal ideas.    Past Medical History:  Diagnosis Date  . HSV (herpes simplex virus) infection   . Medical history non-contributory   . Pregnancy induced hypertension     Past Surgical History:  Procedure Laterality Date  .  NO PAST SURGERIES      Family History  Problem Relation Age of Onset  . Hypertension Maternal Grandmother   . Hypertension Mother     Social History Reviewed with no changes to be made today.   Outpatient Medications Prior to Visit  Medication Sig Dispense Refill  . etonogestrel-ethinyl estradiol (NUVARING) 0.12-0.015 MG/24HR vaginal ring Insert vaginally and leave in place for 3 consecutive weeks, then remove for 1 week. 1 each 12  . metroNIDAZOLE (FLAGYL) 500 MG tablet Take 1  tablet (500 mg total) by mouth 2 (two) times daily. 14 tablet 0  . valACYclovir (VALTREX) 1000 MG tablet Take 1 tablet (1,000 mg total) by mouth daily. 90 tablet 1  . nystatin cream (MYCOSTATIN) Apply 1 application topically 2 (two) times daily. (Patient not taking: No sig reported) 60 g 1  . phentermine (ADIPEX-P) 37.5 MG tablet Take 1 tablet (37.5 mg total) by mouth daily before breakfast. 30 tablet 1   No facility-administered medications prior to visit.    No Known Allergies     Objective:    BP 131/87 (BP Location: Right Arm, Patient Position: Sitting, Cuff Size: Large)   Pulse 78   Temp 97.8 F (36.6 C) (Oral)   Ht 5\' 2"  (1.575 m)   Wt 215 lb (97.5 kg)   SpO2 100%   BMI 39.32 kg/m  Wt Readings from Last 3 Encounters:  07/29/20 215 lb (97.5 kg)  04/18/20 205 lb 12.8 oz (93.4 kg)  04/01/20 207 lb 9.6 oz (94.2 kg)    Physical Exam Vitals and nursing note reviewed.  Constitutional:      Appearance: She is well-developed and well-nourished.  HENT:     Head: Normocephalic and atraumatic.  Eyes:     Extraocular Movements: EOM normal.  Cardiovascular:     Rate and Rhythm: Normal rate and regular rhythm.     Pulses: Intact distal pulses.     Heart sounds: Normal heart sounds. No murmur heard. No friction rub. No gallop.   Pulmonary:     Effort: Pulmonary effort is normal. No tachypnea or respiratory distress.     Breath sounds: Normal breath sounds. No decreased breath sounds, wheezing, rhonchi or rales.  Chest:     Chest wall: No tenderness.  Abdominal:     General: Bowel sounds are normal.     Palpations: Abdomen is soft.  Genitourinary:    Exam position: Lithotomy position.     Pubic Area: Rash present.    Musculoskeletal:        General: No edema. Normal range of motion.     Cervical back: Normal range of motion.  Skin:    General: Skin is warm and dry.  Neurological:     Mental Status: She is alert and oriented to person, place, and time.      Coordination: Coordination normal.  Psychiatric:        Mood and Affect: Mood and affect normal.        Behavior: Behavior normal. Behavior is cooperative.        Thought Content: Thought content normal.        Judgment: Judgment normal.          Patient has been counseled extensively about nutrition and exercise as well as the importance of adherence with medications and regular follow-up. The patient was given clear instructions to go to ER or return to medical center if symptoms don't improve, worsen or new problems develop. The patient verbalized understanding.  Follow-up: Return in about 6 weeks (around 09/09/2020) for weight check/cough.   Claiborne Rigg, FNP-BC [facility] and [facility] [place], [place] [phone]   08/04/2020, 10:39 PM

## 2020-07-30 LAB — HIV ANTIBODY (ROUTINE TESTING W REFLEX): HIV Screen 4th Generation wRfx: NONREACTIVE

## 2020-07-31 LAB — CERVICOVAGINAL ANCILLARY ONLY
Bacterial Vaginitis (gardnerella): NEGATIVE
Candida Glabrata: NEGATIVE
Candida Vaginitis: NEGATIVE
Chlamydia: NEGATIVE
Comment: NEGATIVE
Comment: NEGATIVE
Comment: NEGATIVE
Comment: NEGATIVE
Comment: NEGATIVE
Comment: NORMAL
Neisseria Gonorrhea: NEGATIVE
Trichomonas: NEGATIVE

## 2020-08-04 ENCOUNTER — Encounter: Payer: Self-pay | Admitting: Nurse Practitioner

## 2020-09-13 ENCOUNTER — Ambulatory Visit: Payer: Self-pay | Admitting: Nurse Practitioner

## 2020-09-23 ENCOUNTER — Ambulatory Visit: Payer: Self-pay | Admitting: Nurse Practitioner

## 2020-10-07 ENCOUNTER — Other Ambulatory Visit: Payer: Self-pay

## 2020-10-07 MED FILL — Etonogestrel-Ethinyl Estradiol VA Ring 0.12-0.015 MG/24HR: VAGINAL | 28 days supply | Qty: 1 | Fill #0 | Status: AC

## 2020-10-09 ENCOUNTER — Other Ambulatory Visit: Payer: Self-pay

## 2020-10-30 ENCOUNTER — Other Ambulatory Visit: Payer: Self-pay

## 2020-10-30 MED FILL — Etonogestrel-Ethinyl Estradiol VA Ring 0.12-0.015 MG/24HR: VAGINAL | 28 days supply | Qty: 1 | Fill #1 | Status: AC

## 2020-11-01 ENCOUNTER — Other Ambulatory Visit: Payer: Self-pay

## 2020-11-13 ENCOUNTER — Other Ambulatory Visit: Payer: Self-pay

## 2020-11-13 ENCOUNTER — Encounter: Payer: Self-pay | Admitting: Nurse Practitioner

## 2020-11-13 ENCOUNTER — Ambulatory Visit: Payer: Self-pay | Attending: Nurse Practitioner | Admitting: Nurse Practitioner

## 2020-11-13 DIAGNOSIS — E669 Obesity, unspecified: Secondary | ICD-10-CM

## 2020-11-13 DIAGNOSIS — N760 Acute vaginitis: Secondary | ICD-10-CM

## 2020-11-13 DIAGNOSIS — A6004 Herpesviral vulvovaginitis: Secondary | ICD-10-CM

## 2020-11-13 DIAGNOSIS — N939 Abnormal uterine and vaginal bleeding, unspecified: Secondary | ICD-10-CM

## 2020-11-13 MED ORDER — ACYCLOVIR 400 MG PO TABS
400.0000 mg | ORAL_TABLET | Freq: Two times a day (BID) | ORAL | 0 refills | Status: AC
  Filled 2020-11-13 (×2): qty 60, 30d supply, fill #0

## 2020-11-13 MED ORDER — FLUCONAZOLE 150 MG PO TABS
150.0000 mg | ORAL_TABLET | ORAL | 0 refills | Status: DC
  Filled 2020-11-13: qty 3, 9d supply, fill #0

## 2020-11-13 NOTE — Progress Notes (Signed)
Virtual Visit via Telephone Note Due to national recommendations of social distancing due to [place], telehealth visit is felt to be most appropriate for this patient at this time.  I discussed the limitations, risks, security and privacy concerns of performing an evaluation and management service by telephone and the availability of in person appointments. I also discussed with the patient that there may be a patient responsible charge related to this service. The patient expressed understanding and agreed to proceed.    I connected with Ginny Forth on 11/13/20  at   2:30 PM EDT  EDT by telephone and verified that I am speaking with the correct person using two identifiers.   Consent I discussed the limitations, risks, security and privacy concerns of performing an evaluation and management service by telephone and the availability of in person appointments. I also discussed with the patient that there may be a patient responsible charge related to this service. The patient expressed understanding and agreed to proceed.   Location of Patient: Private Residence   Location of Provider: [place] and [employer] Office    Persons participating in Telemedicine visit: Geryl Rankins FNP-BC [place]    History of Present Illness: Telemedicine visit for: Weight loss  She has complaints of vaginal itching and irritation. This has been ongoing for several months now. She did test positive for HSV antibodies. Unclear if this is yeast or active herpes lesions.    She is concerned about fertility and wants to make sure she is healthy enough to began trying to conceive. She has a history of pre eclampsia and PCOS.  Requesting a physical.  Past Medical History:  Diagnosis Date  . HSV (herpes simplex virus) infection   . Medical history non-contributory   . Pregnancy induced hypertension     Past Surgical History:  Procedure  Laterality Date  . NO PAST SURGERIES      Family History  Problem Relation Age of Onset  . Hypertension Maternal Grandmother   . Hypertension Mother     Social History   Socioeconomic History  . Marital status: Single    Spouse name: Not on file  . Number of children: Not on file  . Years of education: Not on file  . Highest education level: Not on file  Occupational History  . Not on file  Tobacco Use  . Smoking status: Never Smoker  . Smokeless tobacco: Never Used  Substance and Sexual Activity  . Alcohol use: No  . Drug use: Not Currently  . Sexual activity: Yes    Birth control/protection: Pill  Other Topics Concern  . Not on file  Social History Narrative  . Not on file   Social Determinants of Health   Financial Resource Strain: Not on file  Food Insecurity: Not on file  Transportation Needs: Not on file  Physical Activity: Not on file  Stress: Not on file  Social Connections: Not on file     Observations/Objective: Awake, alert and oriented x 3   Review of Systems  Constitutional: Negative for fever, malaise/fatigue and weight loss.  HENT: Negative.  Negative for nosebleeds.   Eyes: Negative.  Negative for blurred vision, double vision and photophobia.  Respiratory: Negative.  Negative for cough and shortness of breath.   Cardiovascular: Negative.  Negative for chest pain, palpitations and leg swelling.  Gastrointestinal: Negative.  Negative for heartburn, nausea and vomiting.  Genitourinary:       SEE HPI  Musculoskeletal: Negative.  Negative for myalgias.  Neurological: Negative.  Negative for dizziness, focal weakness, seizures and headaches.  Psychiatric/Behavioral: Negative.  Negative for suicidal ideas.    Assessment and Plan: Diagnoses and all orders for this visit:  Acute vaginitis -     fluconazole (DIFLUCAN) 150 MG tablet; Take 1 tablet (150 mg total) by mouth every 3 (three) days. -     Cervicovaginal ancillary only; Future  Type 2  herpes simplex infection of vulvovaginal region -     acyclovir (ZOVIRAX) 400 MG tablet; Take 1 tablet (400 mg total) by mouth 2 (two) times daily. -     HSV(herpes simplex vrs) 1+2 ab-IgM; Future  Obesity (BMI 30-39.9) -     Hemoglobin A1c; Future -     CMP14+EGFR; Future -     Lipid panel; Future  Abnormal uterine bleeding (AUB) -     CBC; Future     Follow Up Instructions Return for physical.     I discussed the assessment and treatment plan with the patient. The patient was provided an opportunity to ask questions and all were answered. The patient agreed with the plan and demonstrated an understanding of the instructions.   The patient was advised to call back or seek an in-person evaluation if the symptoms worsen or if the condition fails to improve as anticipated.  I provided 18 minutes of non-face-to-face time during this encounter including median intraservice time, reviewing previous notes, labs, imaging, medications and explaining diagnosis and management.  Gildardo Pounds, FNP-BC

## 2020-11-14 ENCOUNTER — Other Ambulatory Visit: Payer: Self-pay

## 2020-11-14 NOTE — Addendum Note (Signed)
Addended byMemory Dance on: 11/14/2020 09:09 AM   Modules accepted: Orders

## 2020-11-15 ENCOUNTER — Other Ambulatory Visit: Payer: Self-pay

## 2020-11-15 DIAGNOSIS — N760 Acute vaginitis: Secondary | ICD-10-CM

## 2020-11-16 LAB — LIPID PANEL
Chol/HDL Ratio: 2.4 ratio (ref 0.0–4.4)
Cholesterol, Total: 183 mg/dL (ref 100–199)
HDL: 76 mg/dL (ref >39)
LDL Chol Calc (NIH): 84 mg/dL (ref 0–99)
Triglycerides: 137 mg/dL (ref 0–149)
VLDL Cholesterol Cal: 23 mg/dL (ref 5–40)

## 2020-11-16 LAB — CMP14+EGFR
ALT: 33 IU/L — ABNORMAL HIGH (ref 0–32)
AST: 15 IU/L (ref 0–40)
Albumin/Globulin Ratio: 1.4 (ref 1.2–2.2)
Albumin: 4.2 g/dL (ref 3.9–5.0)
Alkaline Phosphatase: 83 IU/L (ref 44–121)
BUN/Creatinine Ratio: 15 (ref 9–23)
BUN: 11 mg/dL (ref 6–20)
Bilirubin Total: 0.3 mg/dL (ref 0.0–1.2)
CO2: 21 mmol/L (ref 20–29)
Calcium: 9.3 mg/dL (ref 8.7–10.2)
Chloride: 102 mmol/L (ref 96–106)
Creatinine, Ser: 0.72 mg/dL (ref 0.57–1.00)
Globulin, Total: 2.9 g/dL (ref 1.5–4.5)
Glucose: 80 mg/dL (ref 65–99)
Potassium: 4.5 mmol/L (ref 3.5–5.2)
Sodium: 139 mmol/L (ref 134–144)
Total Protein: 7.1 g/dL (ref 6.0–8.5)
eGFR: 116 mL/min/{1.73_m2} (ref >59)

## 2020-11-16 LAB — CBC
Hematocrit: 41.1 % (ref 34.0–46.6)
Hemoglobin: 13.6 g/dL (ref 11.1–15.9)
MCH: 29.4 pg (ref 26.6–33.0)
MCHC: 33.1 g/dL (ref 31.5–35.7)
MCV: 89 fL (ref 79–97)
Platelets: 256 10*3/uL (ref 150–450)
RBC: 4.63 x10E6/uL (ref 3.77–5.28)
RDW: 12.4 % (ref 11.7–15.4)
WBC: 9.6 10*3/uL (ref 3.4–10.8)

## 2020-11-16 LAB — HEMOGLOBIN A1C
Est. average glucose Bld gHb Est-mCnc: 105 mg/dL
Hgb A1c MFr Bld: 5.3 % (ref 4.8–5.6)

## 2020-11-16 LAB — HSV(HERPES SIMPLEX VRS) I + II AB-IGM: HSVI/II Comb IgM: <0.91 Ratio (ref 0.00–0.90)

## 2020-11-18 ENCOUNTER — Encounter: Payer: Self-pay | Admitting: Nurse Practitioner

## 2020-11-19 ENCOUNTER — Other Ambulatory Visit: Payer: Self-pay | Admitting: Nurse Practitioner

## 2020-11-19 ENCOUNTER — Other Ambulatory Visit: Payer: Self-pay

## 2020-11-19 MED ORDER — LIDOCAINE 3.75 % EX CREA
1.0000 "application " | TOPICAL_CREAM | Freq: Every day | CUTANEOUS | 0 refills | Status: DC | PRN
  Filled 2020-11-19 – 2020-11-27 (×2): qty 60, fill #0

## 2020-11-21 LAB — CERVICOVAGINAL ANCILLARY ONLY
Bacterial Vaginitis (gardnerella): NEGATIVE
Candida Glabrata: NEGATIVE
Candida Vaginitis: NEGATIVE
Chlamydia: NEGATIVE
Comment: NEGATIVE
Comment: NEGATIVE
Comment: NEGATIVE
Comment: NEGATIVE
Comment: NEGATIVE
Comment: NEGATIVE
Comment: NORMAL
HSV1: NEGATIVE
HSV2: NEGATIVE
Neisseria Gonorrhea: NEGATIVE
Trichomonas: NEGATIVE

## 2020-11-27 ENCOUNTER — Other Ambulatory Visit: Payer: Self-pay

## 2020-11-27 MED FILL — Etonogestrel-Ethinyl Estradiol VA Ring 0.12-0.015 MG/24HR: VAGINAL | 28 days supply | Qty: 1 | Fill #2 | Status: AC

## 2020-11-29 ENCOUNTER — Other Ambulatory Visit: Payer: Self-pay

## 2020-12-25 ENCOUNTER — Other Ambulatory Visit: Payer: Self-pay

## 2020-12-25 ENCOUNTER — Ambulatory Visit: Payer: Self-pay | Attending: Nurse Practitioner | Admitting: Nurse Practitioner

## 2020-12-25 VITALS — BP 125/76 | HR 86 | Ht 62.0 in | Wt 226.4 lb

## 2020-12-25 DIAGNOSIS — Z0000 Encounter for general adult medical examination without abnormal findings: Secondary | ICD-10-CM

## 2020-12-25 DIAGNOSIS — N760 Acute vaginitis: Secondary | ICD-10-CM

## 2020-12-25 MED ORDER — LIDOCAINE 3.75 % EX CREA
1.0000 "application " | TOPICAL_CREAM | Freq: Every day | CUTANEOUS | 1 refills | Status: DC | PRN
  Filled 2020-12-25: qty 60, fill #0

## 2020-12-25 MED ORDER — FLUCONAZOLE 150 MG PO TABS
150.0000 mg | ORAL_TABLET | ORAL | 1 refills | Status: DC
  Filled 2020-12-25: qty 3, 9d supply, fill #0

## 2020-12-25 MED ORDER — LIDOCAINE 3 % EX CREA
TOPICAL_CREAM | CUTANEOUS | 1 refills | Status: DC
  Filled 2020-12-25 – 2021-08-13 (×3): qty 30, fill #0

## 2020-12-25 NOTE — Progress Notes (Signed)
Assessment & Plan:  Rhonda Avila was seen today for annual exam.  Diagnoses and all orders for this visit:  Encounter for annual physical exam  Acute vaginitis -     fluconazole (DIFLUCAN) 150 MG tablet; Take 1 tablet (150 mg total) by mouth every 3 (three) days. -     Lidocaine 3.75 % CREA; Apply 1 application topically daily as needed. Apply to external vagina only   Patient has been counseled on age-appropriate routine health concerns for screening and prevention. These are reviewed and up-to-date. Referrals have been placed accordingly. Immunizations are up-to-date or declined.    Subjective:   Chief Complaint  Patient presents with   Annual Exam   HPI Rhonda Avila 30 y.o. female presents to office today for annual physical  She has chronic vaginal and groin skin irritation. Denies vaginal odor. Diflucan PO does help to relieve her symptoms.   Review of Systems  Constitutional:  Negative for fever, malaise/fatigue and weight loss.  HENT: Negative.  Negative for nosebleeds.   Eyes: Negative.  Negative for blurred vision, double vision and photophobia.  Respiratory: Negative.  Negative for cough and shortness of breath.   Cardiovascular: Negative.  Negative for chest pain, palpitations and leg swelling.  Gastrointestinal: Negative.  Negative for heartburn, nausea and vomiting.  Genitourinary:        SEE HPI  Musculoskeletal: Negative.  Negative for myalgias.  Skin: Negative.   Neurological: Negative.  Negative for dizziness, focal weakness, seizures and headaches.  Endo/Heme/Allergies: Negative.   Psychiatric/Behavioral: Negative.  Negative for suicidal ideas.    Past Medical History:  Diagnosis Date   HSV (herpes simplex virus) infection    Medical history non-contributory    Pregnancy induced hypertension     Past Surgical History:  Procedure Laterality Date   NO PAST SURGERIES      Family History  Problem Relation Age of Onset   Hypertension  Maternal Grandmother    Hypertension Mother     Social History Reviewed with no changes to be made today.   Outpatient Medications Prior to Visit  Medication Sig Dispense Refill   etonogestrel-ethinyl estradiol (NUVARING) 0.12-0.015 MG/24HR vaginal ring INSERT VAGINALLY AND LEAVE IN PLACE FOR 3 CONSECUTIVE WEEKS, THEN REMOVE FOR 1 WEEK. (Patient not taking: Reported on 12/25/2020) 1 each 12   fluconazole (DIFLUCAN) 150 MG tablet Take 1 tablet (150 mg total) by mouth every 3 (three) days. (Patient not taking: Reported on 12/25/2020) 3 tablet 0   Lidocaine 3.75 % CREA Apply 1 application topically daily as needed. Apply to external vagina only (Patient not taking: Reported on 12/25/2020) 60 g 0   No facility-administered medications prior to visit.    No Known Allergies     Objective:    BP 125/76   Pulse 86   Ht 5\' 2"  (1.575 m)   Wt 226 lb 6.4 oz (102.7 kg)   LMP 12/01/2020   SpO2 99%   BMI 41.41 kg/m  Wt Readings from Last 3 Encounters:  12/25/20 226 lb 6.4 oz (102.7 kg)  07/29/20 215 lb (97.5 kg)  04/18/20 205 lb 12.8 oz (93.4 kg)    Physical Exam Vitals and nursing note reviewed.  Constitutional:      Appearance: She is well-developed.  HENT:     Head: Normocephalic and atraumatic.     Right Ear: External ear normal.     Left Ear: External ear normal.     Nose: Nose normal.     Mouth/Throat:  Pharynx: No oropharyngeal exudate.  Eyes:     General: No scleral icterus.       Right eye: No discharge.     Conjunctiva/sclera: Conjunctivae normal.     Pupils: Pupils are equal, round, and reactive to light.  Neck:     Thyroid: No thyromegaly.     Trachea: No tracheal deviation.  Cardiovascular:     Rate and Rhythm: Normal rate and regular rhythm.     Heart sounds: Normal heart sounds. No murmur heard.   No friction rub. No gallop.  Pulmonary:     Effort: Pulmonary effort is normal. No tachypnea, accessory muscle usage or respiratory distress.     Breath sounds:  Normal breath sounds. No decreased breath sounds, wheezing, rhonchi or rales.  Abdominal:     General: Bowel sounds are normal. There is no distension.     Palpations: Abdomen is soft. There is no mass.     Tenderness: There is no abdominal tenderness. There is no guarding or rebound.  Musculoskeletal:        General: No tenderness or deformity. Normal range of motion.     Cervical back: Normal range of motion and neck supple.  Lymphadenopathy:     Cervical: No cervical adenopathy.  Skin:    General: Skin is warm and dry.     Findings: No erythema.  Neurological:     Mental Status: She is alert and oriented to person, place, and time.     Cranial Nerves: No cranial nerve deficit.     Coordination: Coordination normal.     Deep Tendon Reflexes: Reflexes are normal and symmetric.  Psychiatric:        Speech: Speech normal.        Behavior: Behavior normal. Behavior is cooperative.        Thought Content: Thought content normal.        Judgment: Judgment normal.         Patient has been counseled extensively about nutrition and exercise as well as the importance of adherence with medications and regular follow-up. The patient was given clear instructions to go to ER or return to medical center if symptoms don't improve, worsen or new problems develop. The patient verbalized understanding.   Follow-up: Return if symptoms worsen or fail to improve.   Claiborne Rigg, FNP-BC [facility] and [facility] [place], [place] [phone]   12/25/2020, 3:19 PM

## 2020-12-26 ENCOUNTER — Encounter: Payer: Self-pay | Admitting: Nurse Practitioner

## 2020-12-30 ENCOUNTER — Ambulatory Visit: Payer: Self-pay

## 2021-01-01 ENCOUNTER — Other Ambulatory Visit: Payer: Self-pay

## 2021-01-01 ENCOUNTER — Ambulatory Visit: Payer: Self-pay | Attending: Nurse Practitioner

## 2021-01-14 ENCOUNTER — Other Ambulatory Visit (HOSPITAL_BASED_OUTPATIENT_CLINIC_OR_DEPARTMENT_OTHER): Payer: Self-pay

## 2021-01-20 ENCOUNTER — Ambulatory Visit: Payer: Self-pay | Attending: Internal Medicine | Admitting: Internal Medicine

## 2021-01-20 ENCOUNTER — Encounter: Payer: Self-pay | Admitting: Internal Medicine

## 2021-01-20 ENCOUNTER — Other Ambulatory Visit: Payer: Self-pay

## 2021-01-20 DIAGNOSIS — K6289 Other specified diseases of anus and rectum: Secondary | ICD-10-CM

## 2021-01-20 MED ORDER — HYDROCORTISONE (PERIANAL) 2.5 % EX CREA
1.0000 | TOPICAL_CREAM | Freq: Two times a day (BID) | CUTANEOUS | 0 refills | Status: DC
Start: 2021-01-20 — End: 2021-03-24
  Filled 2021-01-20: qty 30, 10d supply, fill #0

## 2021-01-20 NOTE — Progress Notes (Signed)
Virtual Visit via Video Note  I connected with Rhonda Avila on 01/20/21 at  9:10 AM EDT by a video enabled telemedicine application and verified that I am speaking with the correct person using two identifiers.  Location: Patient: home Provider: office   I discussed the limitations of evaluation and management by telemedicine and the availability of in person appointments. The patient expressed understanding and agreed to proceed.  History of Present Illness: This is an urgent care visit for rectal pain.  PCP is NP Bertram [place].  Patient complains of rectal pain that started 5 days ago.  She reports having a small ball outside of the rectum which has been present since she gave birth to last child in January 2020.  It increased in size a little last year but it has never been painful until 5 days ago.  She reports that bowel movements have been soft.  As a matter of fact more soft than usual and more frequent.  Normally she would have 1 bowel movement a day.  However over the past 5 days she has been having 3-5 soft stools per day.  No blood mixed in with the stools but she does notice a little blood on the toilet paper after she has had about 3 bowel movements in a day.  She has not had any abdominal pain, nausea or vomiting.  No unexplained weight changes.  No family history of cancer or inflammatory bowel disease.    Outpatient Encounter Medications as of 01/20/2021  Medication Sig   hydrocortisone (ANUSOL-HC) 2.5 % rectal cream Place 1 application rectally 2 (two) times daily.   fluconazole (DIFLUCAN) 150 MG tablet Take 1 tablet (150 mg total) by mouth every 3 (three) days.   Lidocaine 3 % CREA Apply 1 application topically daily as needed. Apply to external vagina only   No facility-administered encounter medications on file as of 01/20/2021.      Observations/Objective: Patient appears to be a known overweight female in NAD.  Sitting in a chair  Assessment and  Plan: 1. Rectal pain She likely has hemorrhoid that flared up due to recent frequent bowel movements. Other possibility is IBD.   I recommend taking some Pepto-Bismol or Imodium. Sitz baths once a day for 10 minutes discussed and recommended. I will prescribe some Anusol suppository for her to use twice a day. Follow-up with her PCP in 2 to 4 weeks to see if problem has resolved. - hydrocortisone (ANUSOL-HC) 2.5 % rectal cream; Place 1 application rectally 2 (two) times daily.  Dispense: 30 g; Refill: 0   Follow Up Instructions: With PCP in 2-4 wks   I discussed the assessment and treatment plan with the patient. The patient was provided an opportunity to ask questions and all were answered. The patient agreed with the plan and demonstrated an understanding of the instructions.   The patient was advised to call back or seek an in-person evaluation if the symptoms worsen or if the condition fails to improve as anticipated.  I spent 14 minutes dedicated to the care of this patient on the date of this encounter to include face-to-face time with patient discussing her symptoms, diagnosis and treatment and postvisit ordering of medication.     Jonah Blue, MD

## 2021-01-22 ENCOUNTER — Other Ambulatory Visit: Payer: Self-pay

## 2021-02-18 ENCOUNTER — Ambulatory Visit: Payer: Self-pay | Admitting: *Deleted

## 2021-02-18 ENCOUNTER — Encounter: Payer: Self-pay | Admitting: Nurse Practitioner

## 2021-02-18 NOTE — Telephone Encounter (Signed)
I sent her a my chart message.

## 2021-02-18 NOTE — Telephone Encounter (Signed)
Pt requesting call from PCP pt declined the ed from nurse triage.

## 2021-02-18 NOTE — Telephone Encounter (Signed)
Reason for Disposition  Numbness (i.e., loss of sensation) in hand or fingers (Exception: just tingling; numbness present > 2 weeks)  Answer Assessment - Initial Assessment Questions 1. ONSET: "When did the pain start?"     2 weeks ago  2. LOCATION: "Where is the pain located?"     Bilateral hands to elbows numbness 3. PAIN: "How bad is the pain?" (Scale 1-10; or mild, moderate, severe)   - MILD (1-3): doesn't interfere with normal activities   - MODERATE (4-7): interferes with normal activities (e.g., work or school) or awakens from sleep   - SEVERE (8-10): excruciating pain, unable to use hand at all     Pain 10 . Interferes with normal activities 4. WORK OR EXERCISE: "Has there been any recent work or exercise that involved this part (i.e., hand or wrist) of the body?"     No  5. CAUSE: "What do you think is causing the pain?"     Not sure  6. AGGRAVATING FACTORS: "What makes the pain worse?" (e.g., using computer)     Picking up heavy objects or metal makes worse. 7. OTHER SYMPTOMS: "Do you have any other symptoms?" (e.g., neck pain, swelling, rash, numbness, fever)     Numbness and tingling 8. PREGNANCY: "Is there any chance you are pregnant?" "When was your last menstrual period?"     Na  Protocols used: Hand and Wrist Pain-A-AH

## 2021-02-18 NOTE — Telephone Encounter (Signed)
The patient would like to be contacted regarding numbness in their hands   The patient shares that they've been experiencing a lack of feeling from the elbow down in both of their hands but primarily their right   The patient shares that they've experienced the discomfort all day long at times   The patient has been experiencing the discomfort for roughly two weeks   Please contact further when possible   Called patient to review symptoms. C/o numbness and tingling from hands to elbows x 2 weeks. C/o level 10 pain, and "feels like electricity". Difficulty picking up heavy objects, like she will drop something. Numbness comes and goes but is constant throughout the day. Difficulty brushing teeth and combing hair, needs to take a break during activity to complete. Earliest appt 03/24/21. Encouraged patient to go to ED. Patient would like a call back from PCP. Care advise given. Patient verbalized understanding of care advise and to call back or go to [facility] or ED if symptoms worsen.

## 2021-02-19 ENCOUNTER — Other Ambulatory Visit: Payer: Self-pay

## 2021-02-19 ENCOUNTER — Ambulatory Visit: Payer: Self-pay | Admitting: Physician Assistant

## 2021-02-19 VITALS — BP 135/70 | HR 73 | Temp 98.2°F | Resp 18 | Ht 62.0 in | Wt 232.0 lb

## 2021-02-19 DIAGNOSIS — G5601 Carpal tunnel syndrome, right upper limb: Secondary | ICD-10-CM

## 2021-02-19 NOTE — Progress Notes (Signed)
Established Patient Office Visit  Subjective:  Patient ID: Rhonda Avila, female    DOB: 12/12/1990  Age: 30 y.o. MRN: [id number]  CC:  Chief Complaint  Patient presents with   right hand pain     HPI [name] states that she has been having intermittent numbness in her right hand for the past 2 weeks.  States that she will notice tingling and sharp pains in her hand and wrist, sometimes traveling up towards her elbow, states that these occur while she is doing repetitive motions, or talking on the phone.  Does endorse that it seems to be worse in the morning.  States that she is right-handed, works as a Secretary/administrator.  States that she has tried Tylenol with a little relief.  Reports that she also has started noticing it in her left hand, but "not as bad." Denies injury or trauma to hands or wrists.  T     Past Medical History:  Diagnosis Date   HSV (herpes simplex virus) infection    Medical history non-contributory    Pregnancy induced hypertension     Past Surgical History:  Procedure Laterality Date   NO PAST SURGERIES      Family History  Problem Relation Age of Onset   Hypertension Maternal Grandmother    Hypertension Mother     Social History   Socioeconomic History   Marital status: Single    Spouse name: Not on file   Number of children: Not on file   Years of education: Not on file   Highest education level: Not on file  Occupational History   Not on file  Tobacco Use   Smoking status: Never   Smokeless tobacco: Never  Substance and Sexual Activity   Alcohol use: No   Drug use: Not Currently   Sexual activity: Yes    Comment: same partner  Other Topics Concern   Not on file  Social History Narrative   Not on file   Social Determinants of Health   Financial Resource Strain: Not on file  Food Insecurity: Not on file  Transportation Needs: Not on file  Physical Activity: Not on file  Stress: Not on file   Social Connections: Not on file  Intimate Partner Violence: Not on file    Outpatient Medications Prior to Visit  Medication Sig Dispense Refill   hydrocortisone (ANUSOL-HC) 2.5 % rectal cream Place 1 application rectally 2 (two) times daily. 30 g 0   Lidocaine 3 % CREA Apply 1 application topically daily as needed. Apply to external vagina only 30 g 1   fluconazole (DIFLUCAN) 150 MG tablet Take 1 tablet (150 mg total) by mouth every 3 (three) days. 3 tablet 1   No facility-administered medications prior to visit.    No Known Allergies  ROS Review of Systems  Constitutional: Negative.   HENT: Negative.    Eyes: Negative.   Respiratory:  Negative for shortness of breath.   Cardiovascular:  Negative for chest pain.  Gastrointestinal: Negative.   Endocrine: Negative.   Genitourinary: Negative.   Musculoskeletal:  Negative for joint swelling.  Skin: Negative.   Allergic/Immunologic: Negative.   Neurological: Negative.   Hematological: Negative.   Psychiatric/Behavioral: Negative.       Objective:    Physical Exam Vitals and nursing note reviewed.  Constitutional:      Appearance: Normal appearance.  HENT:     Head: Normocephalic.     Right Ear: External ear normal.  Left Ear: External ear normal.     Nose: Nose normal.     Mouth/Throat:     Mouth: Mucous membranes are moist.     Pharynx: Oropharynx is clear.  Eyes:     Extraocular Movements: Extraocular movements intact.     Conjunctiva/sclera: Conjunctivae normal.     Pupils: Pupils are equal, round, and reactive to light.  Cardiovascular:     Rate and Rhythm: Normal rate and regular rhythm.     Pulses: Normal pulses.     Heart sounds: Normal heart sounds.  Pulmonary:     Effort: Pulmonary effort is normal.     Breath sounds: Normal breath sounds.  Musculoskeletal:     Right wrist: Tenderness present. No swelling. Normal range of motion.     Left wrist: Normal.     Right hand: Tenderness present. No  swelling. Decreased range of motion. Decreased strength. Normal sensation. Normal pulse.     Left hand: Normal.     Cervical back: Normal range of motion and neck supple.  Skin:    General: Skin is warm and dry.  Neurological:     General: No focal deficit present.     Mental Status: She is alert and oriented to person, place, and time.  Psychiatric:        Mood and Affect: Mood normal.        Behavior: Behavior normal.        Thought Content: Thought content normal.        Judgment: Judgment normal.    BP 135/70 (BP Location: Left Arm, Patient Position: Sitting, Cuff Size: Normal)   Pulse 73   Temp 98.2 F (36.8 C) (Oral)   Resp 18   Ht 5' 2"  (1.575 m)   Wt 232 lb (105.2 kg)   LMP 12/27/2020 Comment: stopped Nuva Ring on 7/12  SpO2 100%   BMI 42.43 kg/m  Wt Readings from Last 3 Encounters:  02/19/21 232 lb (105.2 kg)  12/25/20 226 lb 6.4 oz (102.7 kg)  07/29/20 215 lb (97.5 kg)     Health Maintenance Due  Topic Date Due   Hepatitis C Screening  Never done   COVID-19 Vaccine (3 - Booster) 02/17/2020   INFLUENZA VACCINE  01/13/2021    There are no preventive care reminders to display for this patient.  Lab Results  Component Value Date   TSH 0.976 12/06/2019   Lab Results  Component Value Date   WBC 9.6 11/14/2020   HGB 13.6 11/14/2020   HCT 41.1 11/14/2020   MCV 89 11/14/2020   PLT 256 11/14/2020   Lab Results  Component Value Date   NA 139 11/14/2020   K 4.5 11/14/2020   CO2 21 11/14/2020   GLUCOSE 80 11/14/2020   BUN 11 11/14/2020   CREATININE 0.72 11/14/2020   BILITOT 0.3 11/14/2020   ALKPHOS 83 11/14/2020   AST 15 11/14/2020   ALT 33 (H) 11/14/2020   PROT 7.1 11/14/2020   ALBUMIN 4.2 11/14/2020   CALCIUM 9.3 11/14/2020   ANIONGAP 8 06/18/2018   EGFR 116 11/14/2020   Lab Results  Component Value Date   CHOL 183 11/14/2020   Lab Results  Component Value Date   HDL 76 11/14/2020   Lab Results  Component Value Date   LDLCALC 84  11/14/2020   Lab Results  Component Value Date   TRIG 137 11/14/2020   Lab Results  Component Value Date   CHOLHDL 2.4 11/14/2020   Lab Results  Component Value Date   HGBA1C 5.3 11/14/2020      Assessment & Plan:   Problem List Items Addressed This Visit       Nervous and Auditory   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  1. Carpal tunnel syndrome of right wrist  Patient education given on supportive care, over-the-counter pain relievers, ice, stretching, bracing.  Red flags given for prompt reevaluation.   I have reviewed the patient's medical history (PMH, PSH, Social History, Family History, Medications, and allergies) , and have been updated if relevant. I spent 20 minutes reviewing chart and  face to face time with patient.    Follow-up: Return if symptoms worsen or fail to improve.    Loraine Grip Mayers, PA-C

## 2021-02-19 NOTE — Patient Instructions (Signed)
I encourage you to start wearing a brace while doing your normal activities during the day, and wearing a brace while sleeping.  Use ibuprofen 200 to 400 mg every 8 hours as needed, ice can also be very helpful.  Please let us know if there is anything else we can do for you  Roney Jaffe, PA-C Physician Assistant [facility] [email or web address]   Carpal Tunnel Syndrome Carpal tunnel syndrome is a condition that causes pain, numbness, and weakness in your hand and fingers. The carpal tunnel is a narrow area located on the palm side of your wrist. Repeated wrist motion or certain diseases may cause swelling within the tunnel. This swelling pinches the main nerve in the wrist. The main nerve in the wrist is called the median nerve. What are the causes? This condition may be caused by: Repeated and forceful wrist and hand motions. Wrist injuries. Arthritis. A cyst or tumor in the carpal tunnel. Fluid buildup during pregnancy. Use of tools that vibrate. Sometimes the cause of this condition is not known. What increases the risk? The following factors may make you more likely to develop this condition: Having a job that requires you to repeatedly or forcefully move your wrist or hand or requires you to use tools that vibrate. This may include jobs that involve using computers, working on an [employer], or working with power tools such as Radiographer, therapeutic. Being a woman. Having certain conditions, such as: Diabetes. Obesity. An underactive thyroid (hypothyroidism). Kidney failure. Rheumatoid arthritis. What are the signs or symptoms? Symptoms of this condition include: A tingling feeling in your fingers, especially in your thumb, index, and middle fingers. Tingling or numbness in your hand. An aching feeling in your entire arm, especially when your wrist and elbow are bent for a long time. Wrist pain that goes up your arm to  your shoulder. Pain that goes down into your palm or fingers. A weak feeling in your hands. You may have trouble grabbing and holding items. Your symptoms may feel worse during the night. How is this diagnosed? This condition is diagnosed with a medical history and physical exam. You may also have tests, including: Electromyogram (EMG). This test measures electrical signals sent by your nerves into the muscles. Nerve conduction study. This test measures how well electrical signals pass through your nerves. Imaging tests, such as X-rays, ultrasound, and MRI. These tests check for possible causes of your condition. How is this treated? This condition may be treated with: Lifestyle changes. It is important to stop or change the activity that caused your condition. Doing exercise and activities to strengthen and stretch your muscles and tendons (physical therapy). Making lifestyle changes to help with your condition and learning how to do your daily activities safely (occupational therapy). Medicines for pain and inflammation. This may include medicine that is injected into your wrist. A wrist splint or brace. Surgery. Follow these instructions at home: If you have a splint or brace: Wear the splint or brace as told by your health care provider. Remove it only as told by your health care provider. Loosen the splint or brace if your fingers tingle, become numb, or turn cold and blue. Keep the splint or brace clean. If the splint or brace is not waterproof: Do not let it get wet. Cover it with a watertight covering when you take a bath or shower. Managing pain, stiffness, and swelling If directed, put ice on the painful area. To do this: If you  have a removeable splint or brace, remove it as told by your health care provider. Put ice in a plastic bag. Place a towel between your skin and the bag or between the splint or brace and the bag. Leave the ice on for 20 minutes, 2-3 times a day. Do  not fall asleep with the cold pack on your skin. Remove the ice if your skin turns bright red. This is very important. If you cannot feel pain, heat, or cold, you have a greater risk of damage to the area. Move your fingers often to reduce stiffness and swelling. General instructions Take over-the-counter and prescription medicines only as told by your health care provider. Rest your wrist and hand from any activity that may be causing your pain. If your condition is work related, talk with your employer about changes that can be made, such as getting a wrist pad to use while typing. Do any exercises as told by your health care provider, physical therapist, or occupational therapist. Keep all follow-up visits. This is important. Contact a health care provider if: You have new symptoms. Your pain is not controlled with medicines. Your symptoms get worse. Get help right away if: You have severe numbness or tingling in your wrist or hand. Summary Carpal tunnel syndrome is a condition that causes pain, numbness, and weakness in your hand and fingers. It is usually caused by repeated wrist motions. Lifestyle changes and medicines are used to treat carpal tunnel syndrome. Surgery may be recommended. Follow your health care provider's instructions about wearing a splint, resting from activity, keeping follow-up visits, and calling for help. This information is not intended to replace advice given to you by your health care provider. Make sure you discuss any questions you have with your health care provider. Document Revised: 10/12/2019 Document Reviewed: 10/12/2019 Elsevier Patient Education  2022 [employer].

## 2021-02-19 NOTE — Progress Notes (Signed)
Patient complains of numbness in the right hand starting 2 weeks ago with left hand now presenting sensation. Patient reports the pain as tingling and sharp pains. Patient grips and balls fist to keep ligaments and muscles moving.

## 2021-02-20 DIAGNOSIS — G5601 Carpal tunnel syndrome, right upper limb: Secondary | ICD-10-CM | POA: Insufficient documentation

## 2021-02-24 ENCOUNTER — Ambulatory Visit: Payer: Self-pay

## 2021-03-24 ENCOUNTER — Other Ambulatory Visit: Payer: Self-pay

## 2021-03-24 ENCOUNTER — Encounter: Payer: Self-pay | Admitting: Nurse Practitioner

## 2021-03-24 ENCOUNTER — Ambulatory Visit: Payer: Self-pay | Attending: Nurse Practitioner | Admitting: Nurse Practitioner

## 2021-03-24 VITALS — BP 124/79 | HR 79 | Ht 62.0 in | Wt 231.4 lb

## 2021-03-24 DIAGNOSIS — Z23 Encounter for immunization: Secondary | ICD-10-CM

## 2021-03-24 DIAGNOSIS — B372 Candidiasis of skin and nail: Secondary | ICD-10-CM

## 2021-03-24 DIAGNOSIS — R6882 Decreased libido: Secondary | ICD-10-CM

## 2021-03-24 DIAGNOSIS — A6004 Herpesviral vulvovaginitis: Secondary | ICD-10-CM

## 2021-03-24 DIAGNOSIS — K6289 Other specified diseases of anus and rectum: Secondary | ICD-10-CM

## 2021-03-24 DIAGNOSIS — N912 Amenorrhea, unspecified: Secondary | ICD-10-CM

## 2021-03-24 DIAGNOSIS — G5601 Carpal tunnel syndrome, right upper limb: Secondary | ICD-10-CM

## 2021-03-24 MED ORDER — HYDROCORTISONE (PERIANAL) 2.5 % EX CREA
1.0000 "application " | TOPICAL_CREAM | Freq: Two times a day (BID) | CUTANEOUS | 0 refills | Status: DC
  Filled 2021-03-24: qty 60, 20d supply, fill #0

## 2021-03-24 MED ORDER — GABAPENTIN 300 MG PO CAPS
300.0000 mg | ORAL_CAPSULE | Freq: Every day | ORAL | 0 refills | Status: DC
  Filled 2021-03-24: qty 30, 30d supply, fill #0
  Filled 2021-06-11: qty 30, 30d supply, fill #1
  Filled 2021-08-12: qty 30, 30d supply, fill #2
  Filled 2021-08-13: qty 30, 30d supply, fill #0

## 2021-03-24 MED ORDER — VALACYCLOVIR HCL 1 G PO TABS
1000.0000 mg | ORAL_TABLET | Freq: Every day | ORAL | 3 refills | Status: DC
  Filled 2021-03-24: qty 30, 30d supply, fill #0

## 2021-03-24 MED ORDER — CLOTRIMAZOLE 1 % EX CREA
1.0000 "application " | TOPICAL_CREAM | Freq: Two times a day (BID) | CUTANEOUS | 1 refills | Status: DC
  Filled 2021-03-24: qty 60, 30d supply, fill #0

## 2021-03-24 NOTE — Progress Notes (Signed)
Assessment & Plan:  Rhonda Avila was seen today for numbness and herpes zoster.  Diagnoses and all orders for this visit:  Carpal tunnel syndrome of right wrist -     gabapentin (NEURONTIN) 300 MG capsule; Take 1 capsule (300 mg total) by mouth at bedtime.  Amenorrhea -     Beta hCG quant (ref lab)  Low libido -     Testosterone  Rectal pain -     hydrocortisone (ANUSOL-HC) 2.5 % rectal cream; Place 1 application rectally 2 (two) times daily.  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  Need for immunization against influenza -     Flu Vaccine QUAD 31mo+IM (Fluarix, Fluzone & Alfiuria Quad PF)  Candidiasis, intertrigo -     clotrimazole (LOTRIMIN) 1 % cream; Apply 1 application topically 2 (two) times daily.   Patient has been counseled on age-appropriate routine health concerns for screening and prevention. These are reviewed and up-to-date. Referrals have been placed accordingly. Immunizations are up-to-date or declined.    Subjective:   Chief Complaint  Patient presents with   Numbness    From hand to elbow.    Herpes Zoster   HPI Rhonda Avila 30 y.o. female presents to office today for follow up to carpal tunnel. She has a past medical history of HSV infection and Pregnancy induced hypertension.   Carpal Tunnel Symptoms She has been wearing a hand brace x 5 weeks. Has noticed some improvement with brace and taking ibuprofen. R>L.  Right hand pain  ongoing for 8 weeks. Associated symptoms: Numbness in the fingertips of the right hand.    Amenorrhea No period since July. Home pregnancy tests have been negative.   GU On vaginal exam today she has an active outbreak of herpes lesions. Also noted for candidiasis in the skin folds of groin.   Review of Systems  Constitutional:  Negative for fever, malaise/fatigue and weight loss.  HENT: Negative.   Negative for nosebleeds.   Eyes: Negative.  Negative for blurred vision, double vision and photophobia.  Respiratory: Negative.  Negative for cough and shortness of breath.   Cardiovascular: Negative.  Negative for chest pain, palpitations and leg swelling.  Gastrointestinal: Negative.  Negative for abdominal pain, blood in stool, constipation, diarrhea, heartburn, melena, nausea and vomiting.       History of rectal pain. No pain at this time but requesting refill of medication today  Genitourinary:        SEE HPI  Musculoskeletal:  Positive for joint pain. Negative for myalgias.  Skin:  Positive for itching and rash.  Neurological:  Positive for sensory change. Negative for dizziness, focal weakness, seizures and headaches.  Psychiatric/Behavioral: Negative.  Negative for suicidal ideas.    Past Medical History:  Diagnosis Date   HSV (herpes simplex virus) infection    Pregnancy induced hypertension     Past Surgical History:  Procedure Laterality Date   NO PAST SURGERIES      Family History  Problem Relation Age of Onset   Hypertension Maternal Grandmother    Hypertension Mother     Social History Reviewed with no changes to be made today.   Outpatient Medications Prior to Visit  Medication Sig Dispense Refill   Lidocaine 3 % CREA Apply 1 application topically daily as needed. Apply to external vagina only (Patient not taking: Reported on 03/24/2021) 30 g 1  hydrocortisone (ANUSOL-HC) 2.5 % rectal cream Place 1 application rectally 2 (two) times daily. (Patient not taking: Reported on 03/24/2021) 30 g 0   No facility-administered medications prior to visit.    No Known Allergies     Objective:    BP 124/79   Pulse 79   Ht 5\' 2"  (1.575 m)   Wt 231 lb 6 oz (105 kg)   LMP 12/27/2020 (Exact Date)   SpO2 98%   BMI 42.32 kg/m  Wt Readings from Last 3 Encounters:  03/24/21 231 lb 6 oz (105 kg)  02/19/21 232 lb (105.2 kg)  12/25/20 226 lb 6.4 oz (102.7 kg)     Physical Exam Vitals and nursing note reviewed.  Constitutional:      Appearance: She is well-developed.  HENT:     Head: Normocephalic and atraumatic.  Cardiovascular:     Rate and Rhythm: Normal rate and regular rhythm.     Heart sounds: Normal heart sounds. No murmur heard.   No friction rub. No gallop.  Pulmonary:     Effort: Pulmonary effort is normal. No tachypnea or respiratory distress.     Breath sounds: Normal breath sounds. No decreased breath sounds, wheezing, rhonchi or rales.  Chest:     Chest wall: No tenderness.  Abdominal:     General: Bowel sounds are normal.     Palpations: Abdomen is soft.  Genitourinary:    Exam position: Lithotomy position.     Labia:        Right: No lesion.        Left: Lesion present.   Musculoskeletal:        General: Normal range of motion.     Cervical back: Normal range of motion.     Comments: Wearing hand brace  Skin:    General: Skin is warm and dry.  Neurological:     Mental Status: She is alert and oriented to person, place, and time.     Coordination: Coordination normal.  Psychiatric:        Behavior: Behavior normal. Behavior is cooperative.        Thought Content: Thought content normal.        Judgment: Judgment normal.         Patient has been counseled extensively about nutrition and exercise as well as the importance of adherence with medications and regular follow-up. The patient was given clear instructions to go to ER or return to medical center if symptoms don't improve, worsen or new problems develop. The patient verbalized understanding.   Follow-up: Return in about 4 weeks (around 04/21/2021) for TELE/video any Tuesday right carpal tunnel. , NEEDS ORANGE CARD/CAFA APP.   Wednesday, FNP-BC [facility] [place], [place]   03/24/2021, 1:02 PM

## 2021-03-24 NOTE — Patient Instructions (Signed)
B12 or B complex

## 2021-03-25 LAB — TESTOSTERONE: Testosterone: 49 ng/dL (ref 13–71)

## 2021-03-25 LAB — BETA HCG QUANT (REF LAB): hCG Quant: <1 m[IU]/mL

## 2021-04-09 ENCOUNTER — Other Ambulatory Visit: Payer: Self-pay

## 2021-04-09 ENCOUNTER — Other Ambulatory Visit: Payer: Self-pay | Admitting: Nurse Practitioner

## 2021-04-09 ENCOUNTER — Encounter: Payer: Self-pay | Admitting: Nurse Practitioner

## 2021-04-09 MED ORDER — ACYCLOVIR 400 MG PO TABS
400.0000 mg | ORAL_TABLET | Freq: Two times a day (BID) | ORAL | 1 refills | Status: AC
  Filled 2021-04-09: qty 60, 30d supply, fill #0

## 2021-04-09 MED ORDER — CLOTRIMAZOLE-BETAMETHASONE 1-0.05 % EX CREA
1.0000 "application " | TOPICAL_CREAM | Freq: Every day | CUTANEOUS | 1 refills | Status: DC
  Filled 2021-04-09: qty 60, 30d supply, fill #0

## 2021-04-11 ENCOUNTER — Other Ambulatory Visit: Payer: Self-pay

## 2021-05-06 ENCOUNTER — Encounter: Payer: Self-pay | Admitting: Nurse Practitioner

## 2021-05-07 ENCOUNTER — Encounter: Payer: Self-pay | Admitting: Nurse Practitioner

## 2021-05-07 ENCOUNTER — Ambulatory Visit: Payer: Self-pay | Attending: Nurse Practitioner | Admitting: Nurse Practitioner

## 2021-05-07 ENCOUNTER — Other Ambulatory Visit: Payer: Self-pay

## 2021-05-07 DIAGNOSIS — N760 Acute vaginitis: Secondary | ICD-10-CM

## 2021-05-07 DIAGNOSIS — G5601 Carpal tunnel syndrome, right upper limb: Secondary | ICD-10-CM

## 2021-05-07 NOTE — Progress Notes (Signed)
Virtual Visit Note Due to national recommendations of social distancing due to COVID 19, virtual visit is felt to be most appropriate for this patient at this time.  I discussed the limitations, risks, security and privacy concerns of performing an evaluation and management service by video and the availability of in person appointments. I also discussed with the patient that there may be a patient responsible charge related to this service. The patient expressed understanding and agreed to proceed.    I connected with Randa Lynn on 05/07/21  at   8:30 AM EST  EDT by VIDEO and verified that I am speaking with the correct person using two identifiers.   Location of Patient: Private Residence   Location of Provider: [facility]    Persons participating in VIRTUAL visit: Bertram Denver FNP-BC Eudora Pura Spice [place] Slider    History of Present Illness: VIRTUAL visit for: Right Carpal Tunnel and vaginitis  Carpal Tunnel Symptoms She has been wearing a hand brace x 5 weeks. Has noticed some improvement with brace and taking ibuprofen. R>L.  Right hand pain  ongoing for 8 weeks. Associated symptoms: Numbness in the fingertips of the right hand  Vaginitis: Patient complains of an abnormal vaginal discharge for several days. Vaginal symptoms include odor.Vulvar symptoms include odor. STI Risk: Very low risk of STD exposureDischarge described as: normal and physiologic.Other associated symptoms: none.Menstrual pattern:      Past Medical History:  Diagnosis Date   HSV (herpes simplex virus) infection    Pregnancy induced hypertension     Past Surgical History:  Procedure Laterality Date   NO PAST SURGERIES      Family History  Problem Relation Age of Onset   Hypertension Maternal Grandmother    Hypertension Mother     Social History   Socioeconomic History   Marital status: Single    Spouse name: Not on file   Number of  children: Not on file   Years of education: Not on file   Highest education level: Not on file  Occupational History   Not on file  Tobacco Use   Smoking status: Never   Smokeless tobacco: Never  Substance and Sexual Activity   Alcohol use: No   Drug use: Not Currently   Sexual activity: Yes    Comment: same partner  Other Topics Concern   Not on file  Social History Narrative   Not on file   Social Determinants of Health   Financial Resource Strain: Not on file  Food Insecurity: Not on file  Transportation Needs: Not on file  Physical Activity: Not on file  Stress: Not on file  Social Connections: Not on file     Observations/Objective: Awake, alert and oriented x 3   Review of Systems  Constitutional:  Negative for fever, malaise/fatigue and weight loss.  HENT: Negative.  Negative for nosebleeds.   Eyes: Negative.  Negative for blurred vision, double vision and photophobia.  Respiratory: Negative.  Negative for cough and shortness of breath.   Cardiovascular: Negative.  Negative for chest pain, palpitations and leg swelling.  Gastrointestinal: Negative.  Negative for heartburn, nausea and vomiting.  Genitourinary:        SEE HPI  Musculoskeletal: Negative.  Negative for myalgias.  Neurological: Negative.  Negative for dizziness, focal weakness, seizures and headaches.  Psychiatric/Behavioral: Negative.  Negative for suicidal ideas.    Assessment and Plan: Diagnoses and all orders for this visit:  Carpal tunnel syndrome of right wrist  Continue gabapentin at this time Patient has been advised to apply for financial assistance and schedule to see our financial counselor.    Acute vaginitis -     Cervicovaginal ancillary only; Future    Follow Up Instructions Return if symptoms worsen or fail to improve.     I discussed the assessment and treatment plan with the patient. The patient was provided an opportunity to ask questions and all were answered. The  patient agreed with the plan and demonstrated an understanding of the instructions.   The patient was advised to call back or seek an in-person evaluation if the symptoms worsen or if the condition fails to improve as anticipated.  I provided 10 minutes of face-to-face time during this encounter including median intraservice time, reviewing previous notes, labs, imaging, medications and explaining diagnosis and management.  Claiborne Rigg, FNP-BC

## 2021-06-11 ENCOUNTER — Other Ambulatory Visit: Payer: Self-pay

## 2021-07-28 ENCOUNTER — Ambulatory Visit: Payer: Self-pay | Attending: Nurse Practitioner

## 2021-07-29 ENCOUNTER — Encounter: Payer: Self-pay | Admitting: Nurse Practitioner

## 2021-07-29 ENCOUNTER — Ambulatory Visit: Payer: Self-pay | Attending: Nurse Practitioner | Admitting: Nurse Practitioner

## 2021-07-29 ENCOUNTER — Other Ambulatory Visit: Payer: Self-pay

## 2021-07-29 DIAGNOSIS — G5601 Carpal tunnel syndrome, right upper limb: Secondary | ICD-10-CM

## 2021-07-29 DIAGNOSIS — M25561 Pain in right knee: Secondary | ICD-10-CM

## 2021-07-29 DIAGNOSIS — N912 Amenorrhea, unspecified: Secondary | ICD-10-CM

## 2021-07-29 DIAGNOSIS — G8929 Other chronic pain: Secondary | ICD-10-CM

## 2021-07-29 NOTE — Progress Notes (Signed)
Virtual Visit Note Due to national recommendations of social distancing due to COVID 19, virtual visit is felt to be most appropriate for this patient at this time.  I discussed the limitations, risks, security and privacy concerns of performing an evaluation and management service by video and the availability of in person appointments. I also discussed with the patient that there may be a patient responsible charge related to this service. The patient expressed understanding and agreed to proceed.    I connected with Randa Lynn on 07/29/21  at   9:10 AM EST  EDT by VIDEO and verified that I am speaking with the correct person using two identifiers.   Location of Patient: Private Residence   Location of Provider: [facility]    Persons participating in VIRTUAL visit: Bertram Denver FNP-BC Jalexus Pura Spice Sheffield Slider    History of Present Illness: VIRTUAL visit for: R knee pain, rectal itching, right wrist carpal tunnel and vaginal irritation.    Carpal Tunnel Symptoms Failed conservative therapy with hand brace x 6 weeks and NSAIDs. Associated symptoms: Numbness in the fingertips of the right hand. States that she will notice tingling and sharp pains in her hand and wrist, sometimes traveling up towards her elbow, states that these occur while she is doing repetitive motions, or talking on the phone.  Does endorse that it seems to be worse in the morning.  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    Amenorrhea No menstrual cycle since 12-2020 after she stopped using the nuva ring. Pregnancy tests have been negative. She does have a history of PCOS and BMI 42.  GU/GI She has chronic vaginal and groin skin irritation. Also Endorses rectal itching with hemorrhoid which has been present since she gave birth to her last child 06-2018. She also endorses stools of firm consistency which likely is contributing to irritation of  her external hemorrhoid.  She has not had any abdominal pain, nausea or vomiting.  No unexplained weight changes.  No family history of cancer or inflammatory bowel disease.     Right knee pain BMI 42. Notes bilateral knee pain R>L. Denies any injury or trauma. Knee sleeve and NSAIDs ineffective.     Past Medical History:  Diagnosis Date   HSV (herpes simplex virus) infection    Pregnancy induced hypertension     Past Surgical History:  Procedure Laterality Date   NO PAST SURGERIES      Family History  Problem Relation Age of Onset   Hypertension Maternal Grandmother    Hypertension Mother     Social History   Socioeconomic History   Marital status: Single    Spouse name: Not on file   Number of children: Not on file   Years of education: Not on file   Highest education level: Not on file  Occupational History   Not on file  Tobacco Use   Smoking status: Never   Smokeless tobacco: Never  Substance and Sexual Activity   Alcohol use: No   Drug use: Not Currently   Sexual activity: Yes    Comment: same partner  Other Topics Concern   Not on file  Social History Narrative   Not on file   Social Determinants of Health   Financial Resource Strain: Not on file  Food Insecurity: Not on file  Transportation Needs: Not on file  Physical Activity: Not on file  Stress: Not on file  Social Connections: Not on file  Observations/Objective: Awake, alert and oriented x 3   Review of Systems  Constitutional:  Negative for fever, malaise/fatigue and weight loss.  HENT: Negative.  Negative for nosebleeds.   Eyes: Negative.  Negative for blurred vision, double vision and photophobia.  Respiratory: Negative.  Negative for cough and shortness of breath.   Cardiovascular: Negative.  Negative for chest pain, palpitations and leg swelling.  Gastrointestinal: Negative.  Negative for heartburn, nausea and vomiting.  Genitourinary:        SEE HPI  Musculoskeletal:  Positive  for joint pain. Negative for myalgias.  Neurological: Negative.  Negative for dizziness, focal weakness, seizures and headaches.  Psychiatric/Behavioral: Negative.  Negative for suicidal ideas.    Assessment and Plan: Diagnoses and all orders for this visit:  Chronic pain of right knee -     DG Knee Complete 4 Views Right; Future Work on losing weight to help reduce joint pain. May alternate with heat and ice application for pain relief. May also alternate with acetaminophen and Ibuprofen as prescribed pain relief.    Carpal tunnel syndrome of right wrist Patient was urged to apply for the financial assistance program.  They were instructed to inquire at the front desk about the application process for the [place] discount, orange card or other financial assistance.      Amenorrhea Patient was urged to apply for the financial assistance program.  They were instructed to inquire at the front desk about the application process for the [employer] discount, orange card or other financial assistance.         Follow Up Instructions No follow-ups on file.     I discussed the assessment and treatment plan with the patient. The patient was provided an opportunity to ask questions and all were answered. The patient agreed with the plan and demonstrated an understanding of the instructions.   The patient was advised to call back or seek an in-person evaluation if the symptoms worsen or if the condition fails to improve as anticipated.  I provided 14 minutes of face-to-face time during this encounter including median intraservice time, reviewing previous notes, labs, imaging, medications and explaining diagnosis and management.  Claiborne Rigg, FNP-BC

## 2021-08-12 ENCOUNTER — Encounter: Payer: Self-pay | Admitting: Nurse Practitioner

## 2021-08-12 ENCOUNTER — Other Ambulatory Visit: Payer: Self-pay | Admitting: Nurse Practitioner

## 2021-08-12 DIAGNOSIS — G5601 Carpal tunnel syndrome, right upper limb: Secondary | ICD-10-CM

## 2021-08-13 ENCOUNTER — Other Ambulatory Visit: Payer: Self-pay

## 2021-08-15 ENCOUNTER — Other Ambulatory Visit: Payer: Self-pay

## 2021-08-21 ENCOUNTER — Ambulatory Visit (HOSPITAL_COMMUNITY)
Admission: RE | Admit: 2021-08-21 | Discharge: 2021-08-21 | Disposition: A | Discharge status: Home / Self Care | Payer: Self-pay | Source: Ambulatory Visit | Attending: Nurse Practitioner | Admitting: Nurse Practitioner

## 2021-08-21 ENCOUNTER — Other Ambulatory Visit: Payer: Self-pay

## 2021-08-21 DIAGNOSIS — G8929 Other chronic pain: Secondary | ICD-10-CM | POA: Insufficient documentation

## 2021-08-21 DIAGNOSIS — M25561 Pain in right knee: Secondary | ICD-10-CM | POA: Insufficient documentation

## 2021-08-24 ENCOUNTER — Other Ambulatory Visit: Payer: Self-pay | Admitting: Nurse Practitioner

## 2021-08-24 DIAGNOSIS — G8929 Other chronic pain: Secondary | ICD-10-CM

## 2021-08-24 MED ORDER — MELOXICAM 7.5 MG PO TABS
7.5000 mg | ORAL_TABLET | Freq: Every day | ORAL | 0 refills | Status: DC
  Filled 2021-08-24: qty 30, 30d supply, fill #0

## 2021-08-25 ENCOUNTER — Other Ambulatory Visit: Payer: Self-pay

## 2021-08-26 ENCOUNTER — Other Ambulatory Visit: Payer: Self-pay

## 2021-08-26 ENCOUNTER — Ambulatory Visit (INDEPENDENT_AMBULATORY_CARE_PROVIDER_SITE_OTHER): Payer: Self-pay | Admitting: Orthopedic Surgery

## 2021-08-26 ENCOUNTER — Encounter: Payer: Self-pay | Admitting: Orthopedic Surgery

## 2021-08-26 DIAGNOSIS — R202 Paresthesia of skin: Secondary | ICD-10-CM

## 2021-08-26 DIAGNOSIS — R200 Anesthesia of skin: Secondary | ICD-10-CM

## 2021-08-26 NOTE — Progress Notes (Signed)
? ?Office Visit Note ?  ?Patient: Rhonda Avila           ?Date of Birth: Jul 23, 1990           ?MRN: [id number] ?Visit Date: 08/26/2021 ?             ?Requested by: Claiborne Rigg, NP ?[address] [place] ?[address] ?[place],  [place] [id number] ?PCP: Claiborne Rigg, NP ? ? ?Assessment & Plan: ?Visit Diagnoses:  ?1. Numbness and tingling in right hand   ? ? ?Plan: The patient's history and exam findings could be consistent with nerve entrapment syndrome at the wrist or elbow.  Discussed that I would like to get an EMG/nerve conduction study to further evaluate her symptoms.  We briefly discussed the nature of carpal tunnel syndrome and both conservative and surgical treatment options.  I will see her back in the office once the EMG/nerve conduction study is completed and we will discuss the results. ? ?Follow-Up Instructions: No follow-ups on file.  ? ?Orders:  ?No orders of the defined types were placed in this encounter. ? ?No orders of the defined types were placed in this encounter. ? ? ? ? Procedures: ?No procedures performed ? ? ?Clinical Data: ?No additional findings. ? ? ?Subjective: ?Chief Complaint  ?Patient presents with  ? Right Hand - Edema, Numbness, Weakness  ?  RIGHT handed, onset x 6 months, numbness in finger tips up to mid-forearm, when she moves it the feeling comes back. PCP gave her gabapentin for it but it only helps her sleep, wears brace on right hand  ? ? 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  This been going on for around 6 months.  Is progressively worsened.  She notes that all of her fingers go numb and will tingle.  She is numb today in the index, middle, ring, and small finger.  Generally the thumb is uninvolved.  She does wake up at night 2-3 times a week with severe pain in this hand.  She shakes her hand for symptom relief.  She has worn a extension brace on the  right hand at night with minimal symptom relief.  She has difficulty at work because her hands will go numb with certain activities.  She has no history of diabetes, hypothyroidism, wrist trauma, inflammatory arthropathy, or cervical spine issue. ? ?Weakness ?Associated symptoms include weakness.  ? ?Review of Systems  ?Neurological:  Positive for weakness.  ? ? ?Objective: ?Vital Signs: BP 125/76 (BP Location: Left Arm, Patient Position: Sitting)   Pulse 68   Ht 5\' 2"  (1.575 m)   Wt 231 lb 8 oz (105 kg)   BMI 42.34 kg/m?  ? ?Physical Exam ?Constitutional:   ?   Appearance: Normal appearance.  ?Cardiovascular:  ?   Rate and Rhythm: Normal rate.  ?   Pulses: Normal pulses.  ?Pulmonary:  ?   Effort: Pulmonary effort is normal.  ?Skin: ?   General: Skin is warm and dry.  ?   Capillary Refill: Capillary refill takes less than 2 seconds.  ?Neurological:  ?   Mental Status: She is alert.  ? ? ?Right Hand Exam  ? ?Tenderness  ?The patient is experiencing no tenderness.  ? ?Range of Motion  ?The patient has normal right wrist ROM.  ? ?Other  ?Erythema: absent ?Sensation: normal ?Pulse: present ? ?Comments:  Positive Tinel into the middle and ring  fingers.  Positive Phalen sign involving index, middle, ring, and small finger.  Mildly positive Tinel at elbow into middle, ring, and small fingers.  No thenar atrophy w/ 5/5 motor strength.  5/5 FDI strength without atrophy.  ? ? ? ? ?Specialty Comments:  ?No specialty comments available. ? ?Imaging: ?No results found. ? ? ?PMFS History: ?Patient Active Problem List  ? Diagnosis Date Noted  ? Numbness and tingling in right hand 08/26/2021  ? Carpal tunnel syndrome of right wrist 02/20/2021  ? Hypertension 07/20/2018  ? UTI (urinary tract infection) during pregnancy, first trimester 01/10/2018  ? PCOS (polycystic ovarian syndrome) 01/03/2018  ? ?Past Medical History:  ?Diagnosis Date  ? HSV (herpes simplex virus) infection   ? Pregnancy induced hypertension   ?  ?Family  History  ?Problem Relation Age of Onset  ? Hypertension Maternal Grandmother   ? Hypertension Mother   ?  ?Past Surgical History:  ?Procedure Laterality Date  ? NO PAST SURGERIES    ? ?Social History  ? ?Occupational History  ? Not on file  ?Tobacco Use  ? Smoking status: Never  ? Smokeless tobacco: Never  ?Substance and Sexual Activity  ? Alcohol use: No  ? Drug use: Not Currently  ? Sexual activity: Yes  ?  Comment: same partner  ? ? ? ? ? ? ?

## 2021-08-29 ENCOUNTER — Other Ambulatory Visit (HOSPITAL_COMMUNITY)
Admission: RE | Admit: 2021-08-29 | Discharge: 2021-08-29 | Disposition: A | Discharge status: Home / Self Care | Payer: Self-pay | Source: Ambulatory Visit | Attending: Nurse Practitioner | Admitting: Nurse Practitioner

## 2021-08-29 ENCOUNTER — Encounter: Payer: Self-pay | Admitting: Nurse Practitioner

## 2021-08-29 ENCOUNTER — Other Ambulatory Visit: Payer: Self-pay

## 2021-08-29 ENCOUNTER — Other Ambulatory Visit: Payer: Self-pay | Admitting: Nurse Practitioner

## 2021-08-29 DIAGNOSIS — N760 Acute vaginitis: Secondary | ICD-10-CM

## 2021-08-29 DIAGNOSIS — N898 Other specified noninflammatory disorders of vagina: Secondary | ICD-10-CM

## 2021-09-02 ENCOUNTER — Other Ambulatory Visit: Payer: Self-pay

## 2021-09-02 ENCOUNTER — Other Ambulatory Visit: Payer: Self-pay | Admitting: Nurse Practitioner

## 2021-09-02 DIAGNOSIS — B3731 Acute candidiasis of vulva and vagina: Secondary | ICD-10-CM

## 2021-09-02 LAB — CERVICOVAGINAL ANCILLARY ONLY
Bacterial Vaginitis (gardnerella): NEGATIVE
Candida Glabrata: NEGATIVE
Candida Vaginitis: POSITIVE — AB
Chlamydia: NEGATIVE
Comment: NEGATIVE
Comment: NEGATIVE
Comment: NEGATIVE
Comment: NEGATIVE
Comment: NEGATIVE
Comment: NORMAL
Neisseria Gonorrhea: NEGATIVE
Trichomonas: NEGATIVE

## 2021-09-02 MED ORDER — FLUCONAZOLE 150 MG PO TABS
150.0000 mg | ORAL_TABLET | ORAL | 0 refills | Status: DC
  Filled 2021-09-02: qty 3, 9d supply, fill #0

## 2021-09-10 ENCOUNTER — Other Ambulatory Visit: Payer: Self-pay

## 2021-09-10 ENCOUNTER — Encounter: Payer: Self-pay | Admitting: Physical Medicine and Rehabilitation

## 2021-09-10 ENCOUNTER — Ambulatory Visit: Payer: Self-pay | Admitting: Physical Medicine and Rehabilitation

## 2021-09-10 DIAGNOSIS — R202 Paresthesia of skin: Secondary | ICD-10-CM

## 2021-09-10 NOTE — Progress Notes (Signed)
Pt state right hand numbness, tingling and pain. Pt state the sensation runs from her wrist to her elbow. Pt state the numbness in her hand wakes her up at night. Pt state she takes over the counter pain meds, heat and an brace to help ease her pain. ? ?Numeric Pain Rating Scale and Functional Assessment ?Average Pain 7 ? ? ?In the last MONTH (on 0-10 scale) has pain interfered with the following? ? ?1. General activity like being  able to carry out your everyday physical activities such as walking, climbing stairs, carrying groceries, or moving a chair?  ?Rating(10) ? ? ? ? ?

## 2021-09-11 NOTE — Progress Notes (Signed)
? ?Rhonda Avila - 31 y.o. female MRN [id number]  Date of birth: [birth year] ? ?Office Visit Note: ?Visit Date: 09/10/2021 ?PCP: Claiborne Rigg, NP ?Referred by: Marlyne Beards, MD ? ?Subjective: ?Chief Complaint  ?Patient presents with  ? Right Hand - Numbness, Pain  ? Right Elbow - Pain, Numbness  ? ?HPI:  Rhonda Avila is a 31 y.o. female who comes in today at the request of Dr. Marlyne Beards for electrodiagnostic study of the Right upper extremities.  Patient is Right hand dominant.  She reports approximately 6 months of chronic worsening severe right hand pain with numbness and tingling globally in the whole hand.  This upon further questioning is a little bit more radial digits.  She reports nocturnal complaints.  No frank radicular symptoms.  She has been bracing without relief.  She does not have any electrodiagnostic studies to review. ? ?ROS Otherwise per HPI. ? ?Assessment & Plan: ?Visit Diagnoses:  ?  ICD-10-CM   ?1. Paresthesia of skin  R20.2 NCV with EMG (electromyography)  ?  ?  ?Plan: Impression: ?The above electrodiagnostic study is ABNORMAL and reveals evidence of a moderate Right median nerve entrapment at the wrist (carpal tunnel syndrome) affecting sensory and motor components.  ? ?There is no significant electrodiagnostic evidence of any other focal nerve entrapment, brachial plexopathy or cervical radiculopathy.  ? ?Recommendations: ?1.  Follow-up with referring physician. ?2.  Continue current management of symptoms. ?3.  Continue use of resting splint at night-time and as needed during the day. ? ?Meds & Orders: No orders of the defined types were placed in this encounter. ?  ?Orders Placed This Encounter  ?Procedures  ? NCV with EMG (electromyography)  ?  ?Follow-up: Return for Marlyne Beards, MD.  ? ?Procedures: ?No procedures performed  ?EMG & NCV Findings: ?Evaluation of the right median motor nerve showed prolonged distal onset latency (4.6  ms).  The right median (across palm) sensory nerve showed prolonged distal peak latency (Wrist, 4.7 ms), reduced amplitude (7.8 ?V), and prolonged distal peak latency (Palm, 3.1 ms).  All remaining nerves (as indicated in the following tables) were within normal limits.   ? ?All examined muscles (as indicated in the following table) showed no evidence of electrical instability.   ? ?Impression: ?The above electrodiagnostic study is ABNORMAL and reveals evidence of a moderate Right median nerve entrapment at the wrist (carpal tunnel syndrome) affecting sensory and motor components.  ? ?There is no significant electrodiagnostic evidence of any other focal nerve entrapment, brachial plexopathy or cervical radiculopathy.  ? ?Recommendations: ?1.  Follow-up with referring physician. ?2.  Continue current management of symptoms. ?3.  Continue use of resting splint at night-time and as needed during the day. ? ?___________________________ ?Naaman Plummer FAAPMR ?Board Certified, Biomedical engineer of Physical Medicine and Rehabilitation ? ? ? ?Nerve Conduction Studies ?Anti Sensory Summary Table ? ? Stim Site NR Peak (ms) Norm Peak (ms) P-T Amp (?V) Norm P-T Amp Site1 Site2 Delta-P (ms) Dist (cm) Vel (m/s) Norm Vel (m/s)  ?Right Median Acr Palm Anti Sensory (2nd Digit)  31.6?C  ?Wrist    *4.7 <3.6 *7.8 >10 Wrist Palm 1.6 0.0    ?Palm    *3.1 <2.0 9.3         ?Right Radial Anti Sensory (Base 1st Digit)  31.8?C  ?Wrist    1.9 <3.1 64.1  Wrist Base 1st Digit 1.9 0.0    ?Right Ulnar Anti Sensory (5th Digit)  32?C  ?Wrist  2.9 <3.7 17.4 >15.0 Wrist 5th Digit 2.9 14.0 48 >38  ? ?Motor Summary Table ? ? Stim Site NR Onset (ms) Norm Onset (ms) O-P Amp (mV) Norm O-P Amp Site1 Site2 Delta-0 (ms) Dist (cm) Vel (m/s) Norm Vel (m/s)  ?Right Median Motor (Abd Poll Brev)  31.8?C  ?Wrist    *4.6 <4.2 6.6 >5 Elbow Wrist 3.5 19.0 54 >50  ?Elbow    8.1  7.4         ?Right Ulnar Motor (Abd Dig Min)  32?C  ?Wrist    2.7 <4.2 12.2 >3 B Elbow Wrist  3.2 20.0 63 >53  ?B Elbow    5.9  10.5  A Elbow B Elbow 0.9 10.0 111 >53  ?A Elbow    6.8  10.3         ? ?EMG ? ? Side Muscle Nerve Root Ins Act Fibs Psw Amp Dur Poly Recrt Int Dennie BiblePat Comment  ?Right Abd Poll Brev Median C8-T1 Nml Nml Nml Nml Nml 0 Nml Nml   ?Right 1stDorInt Ulnar C8-T1 Nml Nml Nml Nml Nml 0 Nml Nml   ?Right PronatorTeres Median C6-7 Nml Nml Nml Nml Nml 0 Nml Nml   ?Right Biceps Musculocut C5-6 Nml Nml Nml Nml Nml 0 Nml Nml   ?Right Deltoid Axillary C5-6 Nml Nml Nml Nml Nml 0 Nml Nml   ? ? ?Nerve Conduction Studies ?Anti Sensory Left/Right Comparison ? ? Stim Site L Lat (ms) R Lat (ms) L-R Lat (ms) L Amp (?V) R Amp (?V) L-R Amp (%) Site1 Site2 L Vel (m/s) R Vel (m/s) L-R Vel (m/s)  ?Median Acr Palm Anti Sensory (2nd Digit)  31.6?C  ?Wrist  *4.7   *7.8  Wrist Palm     ?Palm  *3.1   9.3        ?Radial Anti Sensory (Base 1st Digit)  31.8?C  ?Wrist  1.9   64.1  Wrist Base 1st Digit     ?Ulnar Anti Sensory (5th Digit)  32?C  ?Wrist  2.9   17.4  Wrist 5th Digit  48   ? ?Motor Left/Right Comparison ? ? Stim Site L Lat (ms) R Lat (ms) L-R Lat (ms) L Amp (mV) R Amp (mV) L-R Amp (%) Site1 Site2 L Vel (m/s) R Vel (m/s) L-R Vel (m/s)  ?Median Motor (Abd Poll Brev)  31.8?C  ?Wrist  *4.6   6.6  Elbow Wrist  54   ?Elbow  8.1   7.4        ?Ulnar Motor (Abd Dig Min)  32?C  ?Wrist  2.7   12.2  B Elbow Wrist  63   ?B Elbow  5.9   10.5  A Elbow B Elbow  111   ?A Elbow  6.8   10.3        ? ? ? ?Waveforms: ?    ? ?   ? ?  ? ?Clinical History: ?No specialty comments available.  ? ? ? ?Objective:  VS:  HT:    WT:   BMI:     BP:   HR: bpm  TEMP: ( )  RESP:  ?Physical Exam ?Musculoskeletal:     ?   General: No swelling, tenderness or deformity.  ?   Comments: Inspection reveals no atrophy of the bilateral APB or FDI or hand intrinsics. There is no swelling, color changes, allodynia or dystrophic changes. There is 5 out of 5 strength in the bilateral wrist extension, finger abduction and long finger flexion.  There is  intact sensation to light touch in all dermatomal and peripheral nerve distributions. There is a negative Hoffmann's test bilaterally.  ?Skin: ?   General: Skin is warm and dry.  ?   Findings: No erythema or rash.  ?Neurological:  ?   General: No focal deficit present.  ?   Mental Status: She is alert and oriented to person, place, and time.  ?   Motor: No weakness or abnormal muscle tone.  ?   Coordination: Coordination normal.  ?Psychiatric:     ?   Mood and Affect: Mood normal.     ?   Behavior: Behavior normal.  ?  ? ?Imaging: ?No results found. ?

## 2021-09-11 NOTE — Procedures (Signed)
EMG & NCV Findings: ?Evaluation of the right median motor nerve showed prolonged distal onset latency (4.6 ms).  The right median (across palm) sensory nerve showed prolonged distal peak latency (Wrist, 4.7 ms), reduced amplitude (7.8 ?V), and prolonged distal peak latency (Palm, 3.1 ms).  All remaining nerves (as indicated in the following tables) were within normal limits.   ? ?All examined muscles (as indicated in the following table) showed no evidence of electrical instability.   ? ?Impression: ?The above electrodiagnostic study is ABNORMAL and reveals evidence of a moderate Right median nerve entrapment at the wrist (carpal tunnel syndrome) affecting sensory and motor components.  ? ?There is no significant electrodiagnostic evidence of any other focal nerve entrapment, brachial plexopathy or cervical radiculopathy.  ? ?Recommendations: ?1.  Follow-up with referring physician. ?2.  Continue current management of symptoms. ?3.  Continue use of resting splint at night-time and as needed during the day. ? ?___________________________ ?Laurence Spates FAAPMR ?Board Certified, Tax adviser of Physical Medicine and Rehabilitation ? ? ? ?Nerve Conduction Studies ?Anti Sensory Summary Table ? ? Stim Site NR Peak (ms) Norm Peak (ms) P-T Amp (?V) Norm P-T Amp Site1 Site2 Delta-P (ms) Dist (cm) Vel (m/s) Norm Vel (m/s)  ?Right Median Acr Palm Anti Sensory (2nd Digit)  31.6?C  ?Wrist    *4.7 <3.6 *7.8 >10 Wrist Palm 1.6 0.0    ?Palm    *3.1 <2.0 9.3         ?Right Radial Anti Sensory (Base 1st Digit)  31.8?C  ?Wrist    1.9 <3.1 64.1  Wrist Base 1st Digit 1.9 0.0    ?Right Ulnar Anti Sensory (5th Digit)  32?C  ?Wrist    2.9 <3.7 17.4 >15.0 Wrist 5th Digit 2.9 14.0 48 >38  ? ?Motor Summary Table ? ? Stim Site NR Onset (ms) Norm Onset (ms) O-P Amp (mV) Norm O-P Amp Site1 Site2 Delta-0 (ms) Dist (cm) Vel (m/s) Norm Vel (m/s)  ?Right Median Motor (Abd Poll Brev)  31.8?C  ?Wrist    *4.6 <4.2 6.6 >5 Elbow Wrist 3.5 19.0 54 >50   ?Elbow    8.1  7.4         ?Right Ulnar Motor (Abd Dig Min)  32?C  ?Wrist    2.7 <4.2 12.2 >3 B Elbow Wrist 3.2 20.0 63 >53  ?B Elbow    5.9  10.5  A Elbow B Elbow 0.9 10.0 111 >53  ?A Elbow    6.8  10.3         ? ?EMG ? ? Side Muscle Nerve Root Ins Act Fibs Psw Amp Dur Poly Recrt Int Fraser Din Comment  ?Right Abd Poll Brev Median C8-T1 Nml Nml Nml Nml Nml 0 Nml Nml   ?Right 1stDorInt Ulnar C8-T1 Nml Nml Nml Nml Nml 0 Nml Nml   ?Right PronatorTeres Median C6-7 Nml Nml Nml Nml Nml 0 Nml Nml   ?Right Biceps Musculocut C5-6 Nml Nml Nml Nml Nml 0 Nml Nml   ?Right Deltoid Axillary C5-6 Nml Nml Nml Nml Nml 0 Nml Nml   ? ? ?Nerve Conduction Studies ?Anti Sensory Left/Right Comparison ? ? Stim Site L Lat (ms) R Lat (ms) L-R Lat (ms) L Amp (?V) R Amp (?V) L-R Amp (%) Site1 Site2 L Vel (m/s) R Vel (m/s) L-R Vel (m/s)  ?Median Acr Palm Anti Sensory (2nd Digit)  31.6?C  ?Wrist  *4.7   *7.8  Wrist Palm     ?Palm  *3.1   9.3        ?  Radial Anti Sensory (Base 1st Digit)  31.8?C  ?Wrist  1.9   64.1  Wrist Base 1st Digit     ?Ulnar Anti Sensory (5th Digit)  32?C  ?Wrist  2.9   17.4  Wrist 5th Digit  48   ? ?Motor Left/Right Comparison ? ? Stim Site L Lat (ms) R Lat (ms) L-R Lat (ms) L Amp (mV) R Amp (mV) L-R Amp (%) Site1 Site2 L Vel (m/s) R Vel (m/s) L-R Vel (m/s)  ?Median Motor (Abd Poll Brev)  31.8?C  ?Wrist  *4.6   6.6  Elbow Wrist  54   ?Elbow  8.1   7.4        ?Ulnar Motor (Abd Dig Min)  32?C  ?Wrist  2.7   12.2  B Elbow Wrist  63   ?B Elbow  5.9   10.5  A Elbow B Elbow  111   ?A Elbow  6.8   10.3        ? ? ? ?Waveforms: ?    ? ?   ? ? ?

## 2021-09-23 ENCOUNTER — Ambulatory Visit (HOSPITAL_BASED_OUTPATIENT_CLINIC_OR_DEPARTMENT_OTHER): Payer: Self-pay | Admitting: Nurse Practitioner

## 2021-09-23 ENCOUNTER — Other Ambulatory Visit: Payer: Self-pay

## 2021-09-23 ENCOUNTER — Other Ambulatory Visit: Payer: Self-pay | Admitting: Nurse Practitioner

## 2021-09-23 ENCOUNTER — Encounter: Payer: Self-pay | Admitting: Nurse Practitioner

## 2021-09-23 DIAGNOSIS — L249 Irritant contact dermatitis, unspecified cause: Secondary | ICD-10-CM

## 2021-09-23 DIAGNOSIS — L309 Dermatitis, unspecified: Secondary | ICD-10-CM

## 2021-09-23 MED ORDER — METHYLPREDNISOLONE 4 MG PO TBPK
ORAL_TABLET | ORAL | 0 refills | Status: DC
  Filled 2021-09-23: qty 21, 6d supply, fill #0

## 2021-09-24 NOTE — Progress Notes (Signed)
Virtual Visit Note ? I discussed the limitations, risks, security and privacy concerns of performing an evaluation and management service by video and the availability of in person appointments. I also discussed with the patient that there may be a patient responsible charge related to this service. The patient expressed understanding and agreed to proceed.  ? ? ?I connected with Randa Lynn on 09/26/21  at   3:10 PM EDT  EDT by VIDEO and verified that I am speaking with the correct person using two identifiers. ? ? ?Location of Patient: ?Private Residence ?  ?Location of Provider: ?[employer] and [employer]  ?  ?Persons participating in VIRTUAL visit: ?Bertram Denver FNP-BC ?Latesia Melissa Noon  ?  ?History of Present Illness: ?VIRTUAL visit for: Contact dermatitis ? ?She has numerous erythematous areas of skin on both feet and other areas of her body with associated pruritus. Onset several days ago. She is unsure what she may have in contact with that may have caused the reaction.  She denies any contact with any family members or friends that have a similar rash ? ? ? ?Past Medical History:  ?Diagnosis Date  ? HSV (herpes simplex virus) infection   ? Pregnancy induced hypertension   ?  ?Past Surgical History:  ?Procedure Laterality Date  ? NO PAST SURGERIES    ?  ?Family History  ?Problem Relation Age of Onset  ? Hypertension Maternal Grandmother   ? Hypertension Mother   ?  ?Social History  ? ?Socioeconomic History  ? Marital status: Single  ?  Spouse name: Not on file  ? Number of children: Not on file  ? Years of education: Not on file  ? Highest education level: Not on file  ?Occupational History  ? Not on file  ?Tobacco Use  ? Smoking status: Never  ? Smokeless tobacco: Never  ?Substance and Sexual Activity  ? Alcohol use: No  ? Drug use: Not Currently  ? Sexual activity: Yes  ?  Comment: same partner  ?Other Topics Concern  ? Not on file  ?Social History  Narrative  ? Not on file  ? ?Social Determinants of Health  ? ?Financial Resource Strain: Not on file  ?Food Insecurity: Not on file  ?Transportation Needs: Not on file  ?Physical Activity: Not on file  ?Stress: Not on file  ?Social Connections: Not on file  ?  ? ?Observations/Objective: ?Awake, alert and oriented x 3 ? ? ?Review of Systems  ?Constitutional:  Negative for fever, malaise/fatigue and weight loss.  ?HENT: Negative.  Negative for nosebleeds.   ?Eyes: Negative.  Negative for blurred vision, double vision and photophobia.  ?Respiratory: Negative.  Negative for cough and shortness of breath.   ?Cardiovascular: Negative.  Negative for chest pain, palpitations and leg swelling.  ?Gastrointestinal: Negative.  Negative for heartburn, nausea and vomiting.  ?Musculoskeletal: Negative.  Negative for myalgias.  ?Skin:  Positive for itching and rash.  ?Neurological: Negative.  Negative for dizziness, focal weakness, seizures and headaches.  ?Psychiatric/Behavioral: Negative.  Negative for suicidal ideas.   ? ?Physical Exam ?Skin: ?   Findings: Erythema and rash present. Rash is macular.  ? ?    ?Neurological:  ?   Mental Status: She is alert.  ?  ?Assessment and Plan: ?Diagnoses and all orders for this visit: ? ?Dermatitis ?Prescribed prednisone Dosepak due to generalized rash ?  ? ?Follow Up Instructions ?Return if symptoms worsen or fail to improve.  ? ?  ?I discussed the  assessment and treatment plan with the patient. The patient was provided an opportunity to ask questions and all were answered. The patient agreed with the plan and demonstrated an understanding of the instructions. ?  ?The patient was advised to call back or seek an in-person evaluation if the symptoms worsen or if the condition fails to improve as anticipated. ? ?I provided 14 minutes of face-to-face time during this encounter including median intraservice time, reviewing previous notes, labs, imaging, medications and explaining diagnosis and  management. ? ?Claiborne Rigg, FNP-BC  ?

## 2021-09-25 ENCOUNTER — Encounter: Payer: Self-pay | Admitting: Orthopedic Surgery

## 2021-09-25 ENCOUNTER — Ambulatory Visit (INDEPENDENT_AMBULATORY_CARE_PROVIDER_SITE_OTHER): Payer: Self-pay | Admitting: Orthopedic Surgery

## 2021-09-25 ENCOUNTER — Other Ambulatory Visit: Payer: Self-pay

## 2021-09-25 DIAGNOSIS — G5601 Carpal tunnel syndrome, right upper limb: Secondary | ICD-10-CM

## 2021-09-25 MED ORDER — LIDOCAINE HCL 1 % IJ SOLN
1.0000 mL | INTRAMUSCULAR | Status: AC | PRN
  Administered 2021-09-25: 1 mL

## 2021-09-25 MED ORDER — BETAMETHASONE SOD PHOS & ACET 6 (3-3) MG/ML IJ SUSP
6.0000 mg | INTRAMUSCULAR | Status: AC | PRN
  Administered 2021-09-25: 6 mg via INTRA_ARTICULAR

## 2021-09-25 NOTE — Progress Notes (Signed)
? ?Office Visit Note ?  ?Patient: Rhonda Avila           ?Date of Birth: [birth year]           ?MRN: [id number] ?Visit Date: 09/25/2021 ?             ?Requested by: Claiborne Rigg, NP ?[address] [place] ?[address] ?[place],  [place] [address] ?PCP: Claiborne Rigg, NP ? ? ?Assessment & Plan: ?Visit Diagnoses: No diagnosis found. ? ?Plan: We reviewed patient's EMG/nerve conduction study from 09/10/21 which suggested a moderate right carpal tunnel syndrome.  Patient is still having continued numbness and paresthesias in her median nerve distribution.  It wakes her up at night.  She has tried night braces with modest symptom relief.  We discussed corticosteroid injection versus carpal tunnel release.  She would like to try a corticosteroid injection as she is unable to take any time off from work now.  I will see her back in the office once the injection is worn off. ? ?Follow-Up Instructions: No follow-ups on file.  ? ?Orders:  ?No orders of the defined types were placed in this encounter. ? ?No orders of the defined types were placed in this encounter. ? ? ? ? Procedures: ?Hand/UE Inj: R carpal tunnel for carpal tunnel syndrome on 09/25/2021 4:38 PM ?Details: 25 G needle, volar approach ?Medications: 1 mL lidocaine 1 %; 6 mg betamethasone acetate-betamethasone sodium phosphate 6 (3-3) MG/ML ?Procedure, treatment alternatives, risks and benefits explained, specific risks discussed. Consent was given by the patient. Immediately prior to procedure a time out was called to verify the correct patient, procedure, equipment, support staff and site/side marked as required. Patient was prepped and draped in the usual sterile fashion.  ? ? ? ? ?Clinical Data: ?No additional findings. ? ? ?Subjective: ?Chief Complaint  ?Patient presents with  ? Right Hand - Follow-up  ? ? 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  She recently underwent EMG on 09/10/21 which suggested a moderate right carpal tunnel syndrome.  She describes continued numbness in the median nerve distribution.  This wakes her up at night.  She has to shake her hands for symptom relief.  She has worn night braces with modest symptom relief.   ? ? ? ?Review of Systems ? ? ?Objective: ?Vital Signs: There were no vitals taken for this visit. ? ?Physical Exam ?Constitutional:   ?   Appearance: Normal appearance.  ?Cardiovascular:  ?   Rate and Rhythm: Normal rate.  ?   Pulses: Normal pulses.  ?Pulmonary:  ?   Effort: Pulmonary effort is normal.  ?Skin: ?   General: Skin is warm and dry.  ?   Capillary Refill: Capillary refill takes less than 2 seconds.  ?Neurological:  ?   Mental Status: She is alert.  ? ? ?Right Hand Exam  ? ?Tenderness  ?The patient is experiencing no tenderness.  ? ?Range of Motion  ?The patient has normal right wrist ROM.  ? ?Other  ?Erythema: absent ?Sensation: normal ?Pulse: present ? ?Comments:  + Tinel and Phalen signs.  5/5 thenar motor strength.   ? ? ? ? ?Specialty Comments:  ?No specialty comments available. ? ?Imaging: ?No results found. ? ? ?PMFS History: ?Patient Active Problem List  ? Diagnosis Date Noted  ? Numbness and tingling in right hand 08/26/2021  ? Carpal tunnel syndrome, right upper limb 02/20/2021  ?  Hypertension 07/20/2018  ? UTI (urinary tract infection) during pregnancy, first trimester 01/10/2018  ? PCOS (polycystic ovarian syndrome) 01/03/2018  ? ?Past Medical History:  ?Diagnosis Date  ? HSV (herpes simplex virus) infection   ? Pregnancy induced hypertension   ?  ?Family History  ?Problem Relation Age of Onset  ? Hypertension Maternal Grandmother   ? Hypertension Mother   ?  ?Past Surgical History:  ?Procedure Laterality Date  ? NO PAST SURGERIES    ? ?Social History  ? ?Occupational History  ? Not on file  ?Tobacco Use  ? Smoking status: Never  ? Smokeless tobacco: Never  ?Substance and Sexual Activity   ? Alcohol use: No  ? Drug use: Not Currently  ? Sexual activity: Yes  ?  Comment: same partner  ? ? ? ? ? ? ?

## 2021-09-26 ENCOUNTER — Encounter: Payer: Self-pay | Admitting: Nurse Practitioner

## 2021-10-23 ENCOUNTER — Ambulatory Visit (HOSPITAL_COMMUNITY)
Admission: EM | Admit: 2021-10-23 | Discharge: 2021-10-23 | Disposition: A | Discharge status: Home / Self Care | Payer: Self-pay | Attending: Internal Medicine | Admitting: Internal Medicine

## 2021-10-23 ENCOUNTER — Encounter (HOSPITAL_COMMUNITY): Payer: Self-pay | Admitting: Emergency Medicine

## 2021-10-23 DIAGNOSIS — T148XXA Other injury of unspecified body region, initial encounter: Secondary | ICD-10-CM

## 2021-10-23 DIAGNOSIS — M542 Cervicalgia: Secondary | ICD-10-CM

## 2021-10-23 LAB — POC URINE PREG, ED: Preg Test, Ur: NEGATIVE

## 2021-10-23 MED ORDER — IBUPROFEN 600 MG PO TABS: 600.0000 mg | ORAL_TABLET | Freq: Four times a day (QID) | ORAL | 0 refills | Status: DC | PRN

## 2021-10-23 MED ORDER — CYCLOBENZAPRINE HCL 5 MG PO TABS: 5.0000 mg | ORAL_TABLET | Freq: Two times a day (BID) | ORAL | 0 refills | Status: DC | PRN

## 2021-10-23 NOTE — ED Provider Notes (Addendum)
?[facility] ? ? ? ?CSN: [id number] ?Arrival date & time: 10/23/21  1758 ? ? ?  ? ?History   ?Chief Complaint ?Chief Complaint  ?Patient presents with  ? Headache  ? Shoulder Pain  ? ? ?HPI ?Rhonda Avila is a 31 y.o. female.  ? ?Patient presents with right-sided headache and right-sided neck pain that started a few days ago.  Denies any injury to the area.  Patient reports that she woke up with a headache and had subsequent neck pain.  Pain is present on the right side of the head and radiates down right lateral neck.  Denies numbness or tingling.  Movement exacerbates pain.  Patient has taken Tylenol for pain with minimal improvement.  Denies dizziness or blurred vision.  Patient is concerned for pregnancy as well given that she has not had a menstrual cycle in over 1 month.  Patient reports that she was using NuvaRing for birth control and stopped using it in June.  Her first menstrual cycle since birth control was in April of this year.  Denies any associated early pregnancy symptoms including breast tenderness, nausea, vomiting, abdominal pain. ? ? ?Headache ?Shoulder Pain ? ?Past Medical History:  ?Diagnosis Date  ? HSV (herpes simplex virus) infection   ? Pregnancy induced hypertension   ? ? ?Patient Active Problem List  ? Diagnosis Date Noted  ? Numbness and tingling in right hand 08/26/2021  ? Carpal tunnel syndrome, right upper limb 02/20/2021  ? Hypertension 07/20/2018  ? UTI (urinary tract infection) during pregnancy, first trimester 01/10/2018  ? PCOS (polycystic ovarian syndrome) 01/03/2018  ? ? ?Past Surgical History:  ?Procedure Laterality Date  ? NO PAST SURGERIES    ? ? ?OB History   ? ? Gravida  ?1  ? Para  ?0  ? Term  ?0  ? Preterm  ?0  ? AB  ?0  ? Living  ?0  ?  ? ? SAB  ?0  ? IAB  ?0  ? Ectopic  ?0  ? Multiple  ?0  ? Live Births  ?0  ?   ?  ?  ? ? ? ?Home Medications   ? ?Prior to Admission medications   ?Medication Sig Start Date End Date Taking? Authorizing Provider   ?cyclobenzaprine (FLEXERIL) 5 MG tablet Take 1 tablet (5 mg total) by mouth 2 (two) times daily as needed for muscle spasms. 10/23/21  Yes Kouper Spinella, Acie Fredrickson, FNP  ?ibuprofen (ADVIL) 600 MG tablet Take 1 tablet (600 mg total) by mouth every 6 (six) hours as needed for mild pain. 10/23/21  Yes Gustavus Bryant, FNP  ?clotrimazole (LOTRIMIN) 1 % cream Apply 1 application topically 2 (two) times daily. 03/24/21   Claiborne Rigg, NP  ?clotrimazole-betamethasone (LOTRISONE) cream Apply 1 application topically daily. 04/09/21   Claiborne Rigg, NP  ?fluconazole (DIFLUCAN) 150 MG tablet Take 1 tablet (150 mg total) by mouth every 3 (three) days. 09/02/21   Claiborne Rigg, NP  ?gabapentin (NEURONTIN) 300 MG capsule Take 1 capsule (300 mg total) by mouth at bedtime. 03/24/21 09/14/21  Claiborne Rigg, NP  ?hydrocortisone (ANUSOL-HC) 2.5 % rectal cream Place 1 application rectally 2 (two) times daily. 03/24/21   Claiborne Rigg, NP  ?Lidocaine 3 % CREA Apply 1 application topically daily as needed. Apply to external vagina only 12/25/20   Claiborne Rigg, NP  ?meloxicam (MOBIC) 7.5 MG tablet Take 1 tablet (7.5 mg total) by mouth daily. 08/24/21  Claiborne Rigg, NP  ?methylPREDNISolone (MEDROL DOSEPAK) 4 MG TBPK tablet TAKE AS DIRECTED OF FOIL PACKET 09/23/21   Claiborne Rigg, NP  ? ? ?Family History ?Family History  ?Problem Relation Age of Onset  ? Hypertension Maternal Grandmother   ? Hypertension Mother   ? ? ?Social History ?Social History  ? ?Tobacco Use  ? Smoking status: Never  ? Smokeless tobacco: Never  ?Substance Use Topics  ? Alcohol use: No  ? Drug use: Not Currently  ? ? ? ?Allergies   ?Patient has no known allergies. ? ? ?Review of Systems ?Review of Systems ?Per HPI ? ?Physical Exam ?Triage Vital Signs ?ED Triage Vitals  ?Enc Vitals Group  ?   BP 10/23/21 1819 129/82  ?   Pulse Rate 10/23/21 1819 87  ?   Resp 10/23/21 1819 17  ?   Temp 10/23/21 1819 97.7 ?F (36.5 ?C)  ?   Temp Source 10/23/21 1819 Oral  ?    SpO2 10/23/21 1819 97 %  ?   Weight --   ?   Height --   ?   Head Circumference --   ?   Peak Flow --   ?   Pain Score 10/23/21 1817 7  ?   Pain Loc --   ?   Pain Edu? --   ?   Excl. in GC? --   ? ?No data found. ? ?Updated Vital Signs ?BP 129/82   Pulse 87   Temp 97.7 ?F (36.5 ?C) (Oral)   Resp 17   LMP 09/15/2021   SpO2 97%   Breastfeeding No  ? ?Visual Acuity ?Right Eye Distance:   ?Left Eye Distance:   ?Bilateral Distance:   ? ?Right Eye Near:   ?Left Eye Near:    ?Bilateral Near:    ? ?Physical Exam ?Constitutional:   ?   General: She is not in acute distress. ?   Appearance: Normal appearance. She is not toxic-appearing or diaphoretic.  ?HENT:  ?   Head: Normocephalic and atraumatic.  ?Eyes:  ?   Extraocular Movements: Extraocular movements intact.  ?   Conjunctiva/sclera: Conjunctivae normal.  ?   Pupils: Pupils are equal, round, and reactive to light.  ?Pulmonary:  ?   Effort: Pulmonary effort is normal.  ?Musculoskeletal:  ?   Cervical back: Tenderness present. No swelling, edema, deformity, erythema, signs of trauma, bony tenderness or crepitus. Pain with movement present.  ?   Thoracic back: Normal.  ?   Lumbar back: Normal.  ?   Comments: Tenderness to palpation to right lateral neck that radiates down into right trapezius.  No obvious swelling or discoloration noted.  No lacerations or abrasions noted.  No direct spinal tenderness or crepitus noted. patient also has tenderness to right side of scalp.  No obvious discoloration or swelling noted there.  No abrasions or lacerations noted.  ?Neurological:  ?   General: No focal deficit present.  ?   Mental Status: She is alert and oriented to person, place, and time. Mental status is at baseline.  ?   Cranial Nerves: Cranial nerves 2-12 are intact.  ?   Sensory: Sensation is intact.  ?   Motor: Motor function is intact.  ?   Coordination: Coordination is intact.  ?   Gait: Gait is intact.  ?Psychiatric:     ?   Mood and Affect: Mood normal.     ?    Behavior: Behavior normal.     ?  Thought Content: Thought content normal.     ?   Judgment: Judgment normal.  ? ? ? ?UC Treatments / Results  ?Labs ?(all labs ordered are listed, but only abnormal results are displayed) ?Labs Reviewed  ?POC URINE PREG, ED  ? ? ?EKG ? ? ?Radiology ?No results found. ? ?Procedures ?Procedures (including critical care time) ? ?Medications Ordered in UC ?Medications - No data to display ? ?Initial Impression / Assessment and Plan / UC Course  ?I have reviewed the triage vital signs and the nursing notes. ? ?Pertinent labs & imaging results that were available during my care of the patient were reviewed by me and considered in my medical decision making (see chart for details). ? ?  ? ?Physical exam is consistent with muscle strain.  Will treat muscle relaxer and NSAIDs.  Patient advised that medication can cause drowsiness and to not drive while taking this medication.  No concern for mastoiditis.  Discussed supportive care and alternating ice and heat application.  Do not think imaging is necessary given no apparent injury or direct spinal tenderness.  Urine pregnancy was negative.  Patient to follow-up with gynecology for further evaluation and management of abnormal menstrual cycles.  Discussed return cautions.  Patient verbalized understanding and was agreeable with plan. ?Final Clinical Impressions(s) / UC Diagnoses  ? ?Final diagnoses:  ?Muscle strain  ?Neck pain  ? ? ? ?Discharge Instructions   ? ?  ?You have been prescribed 2 medications to help alleviate your neck muscle strain.  Please alternate ice and heat to affected area as well.  Follow-up if symptoms persist or worsen.  Urine pregnancy test was negative. ? ? ? ? ?ED Prescriptions   ? ? Medication Sig Dispense Auth. Provider  ? cyclobenzaprine (FLEXERIL) 5 MG tablet Take 1 tablet (5 mg total) by mouth 2 (two) times daily as needed for muscle spasms. 20 tablet [place], [place] F[place]aley E, [place]  ? ibuprofen (ADVIL) 600 MG tablet Take 1  tablet (600 mg total) by mouth every 6 (six) hours as needed for mild pain. 30 tablet Ervin KnackMound, Stephfon Bovey E, [place]NP  ? ?  ? ?PDMP not reviewed this encounter. ?  ?Gustavus BryantMound, Issaac Shipper E, [place]FNP ?10/23/21 1922 ? ?  ?[address], Stanley[place]aley E,

## 2021-10-23 NOTE — ED Triage Notes (Signed)
Pt is present today with c/o HA that radiates down her left shoulder. Pt sx started on Sunday  ?

## 2021-10-23 NOTE — Discharge Instructions (Signed)
You have been prescribed 2 medications to help alleviate your neck muscle strain.  Please alternate ice and heat to affected area as well.  Follow-up if symptoms persist or worsen.  Urine pregnancy test was negative. ?

## 2021-11-20 ENCOUNTER — Encounter: Payer: Self-pay | Admitting: Student

## 2021-12-09 ENCOUNTER — Ambulatory Visit (INDEPENDENT_AMBULATORY_CARE_PROVIDER_SITE_OTHER): Payer: Self-pay | Admitting: Orthopedic Surgery

## 2021-12-09 ENCOUNTER — Other Ambulatory Visit: Payer: Self-pay

## 2021-12-09 ENCOUNTER — Encounter: Payer: Self-pay | Admitting: Family Medicine

## 2021-12-09 ENCOUNTER — Ambulatory Visit (INDEPENDENT_AMBULATORY_CARE_PROVIDER_SITE_OTHER): Payer: Self-pay | Admitting: Family Medicine

## 2021-12-09 VITALS — BP 146/93 | HR 103 | Wt 238.6 lb

## 2021-12-09 DIAGNOSIS — R202 Paresthesia of skin: Secondary | ICD-10-CM

## 2021-12-09 DIAGNOSIS — R200 Anesthesia of skin: Secondary | ICD-10-CM

## 2021-12-09 DIAGNOSIS — Z1329 Encounter for screening for other suspected endocrine disorder: Secondary | ICD-10-CM

## 2021-12-09 DIAGNOSIS — G5601 Carpal tunnel syndrome, right upper limb: Secondary | ICD-10-CM

## 2021-12-09 DIAGNOSIS — L900 Lichen sclerosus et atrophicus: Secondary | ICD-10-CM

## 2021-12-09 MED ORDER — BETAMETHASONE SOD PHOS & ACET 6 (3-3) MG/ML IJ SUSP
6.0000 mg | INTRAMUSCULAR | Status: AC | PRN
  Administered 2021-12-09: 6 mg via INTRA_ARTICULAR

## 2021-12-09 MED ORDER — TRIAMCINOLONE ACETONIDE 0.5 % EX OINT
1.0000 | TOPICAL_OINTMENT | Freq: Two times a day (BID) | CUTANEOUS | 0 refills | Status: DC
  Filled 2021-12-09: qty 30, 15d supply, fill #0

## 2021-12-09 MED ORDER — LIDOCAINE HCL 1 % IJ SOLN
1.0000 mL | INTRAMUSCULAR | Status: AC | PRN
  Administered 2021-12-09: 1 mL

## 2021-12-09 NOTE — Progress Notes (Signed)
   GYNECOLOGY OFFICE VISIT NOTE  History:   Rhonda Avila is a 31 y.o. G1P0000 here today for multiple issues.  Wondering if she has thyroid problems Appetite is very variable Losing more hair than normal Irregular period Has had two periods in the past year Last period two months ago lasted almost two weeks  Has been having itching in and around her vagina Sometimes scratches so much she bleeds Last screened for diabetes one year ago, A1c was 5.3% No vaginal discharge No burning or pain with urination Endorses vaginal odor Sexually active, not currently using birth control Last used nuvaring about a year ago  Health Maintenance Due  Topic Date Due   Hepatitis C Screening  Never done   COVID-19 Vaccine (3 - Pfizer series) 11/12/2019    Past Medical History:  Diagnosis Date   HSV (herpes simplex virus) infection    Pregnancy induced hypertension     Past Surgical History:  Procedure Laterality Date   NO PAST SURGERIES      The following portions of the patient's history were reviewed and updated as appropriate: allergies, current medications, past family history, past medical history, past social history, past surgical history and problem list.   Health Maintenance:   Last pap: Lab Results  Component Value Date   DIAGPAP (A) 07/25/2019    - Atypical squamous cells of undetermined significance (ASC-US)   HPV NOT DETECTED 08/06/2016   HPVHIGH Negative 07/25/2019   Repeat due 07/2022  Last mammogram:  N/a    Review of Systems:  Pertinent items noted in HPI and remainder of comprehensive ROS otherwise negative.  Physical Exam:  BP (!) 146/93   Pulse (!) 103   Wt 238 lb 9.6 oz (108.2 kg)   LMP 09/14/2021 (Exact Date)   BMI 43.64 kg/m  CONSTITUTIONAL: Well-developed, well-nourished female in no acute distress.  HEENT:  Normocephalic, atraumatic. External right and left ear normal. No scleral icterus.  NECK: Normal range of motion, supple,  no masses noted on observation SKIN: No rash noted. Not diaphoretic. No erythema. No pallor. MUSCULOSKELETAL: Normal range of motion. No edema noted. NEUROLOGIC: Alert and oriented to person, place, and time. Normal muscle tone coordination.  PSYCHIATRIC: Normal mood and affect. Normal behavior. Normal judgment and thought content. RESPIRATORY: Effort normal, no problems with respiration noted ABDOMEN: No masses noted. No other overt distention noted.   PELVIC:  external genitlia involving vulva and inguinal areas bilateral with lichenification  Labs and Imaging  No results found.    Assessment and Plan:   Problem List Items Addressed This Visit       Other   Lichen sclerosus    Lichen sclerosis on exam, less likely yeast infection, trial topic steroid ointment, follow up PRN for biopsy if no improvement.       Relevant Medications   triamcinolone ointment (KENALOG) 0.5 %   Screening for thyroid disorder - Primary    Check TSH, reflex free T4      Relevant Orders   TSH Rfx on Abnormal to Free T4 (Completed)    Routine preventative health maintenance measures emphasized. Please refer to After Visit Summary for other counseling recommendations.   Return if symptoms worsen or fail to improve.    Total face-to-face time with patient: 20 minutes.  Over 50% of encounter was spent on counseling and coordination of care.   Venora Maples, MD/MPH Attending Family Medicine Physician, [facility] for [facility], [facility]

## 2021-12-09 NOTE — Progress Notes (Signed)
LMP 09/14/21 Stopped nuvaring last year Neg UPT 3 weeks ago.  Judeth Cornfield, RN

## 2021-12-10 ENCOUNTER — Encounter: Payer: Self-pay | Admitting: Family Medicine

## 2021-12-10 DIAGNOSIS — Z1329 Encounter for screening for other suspected endocrine disorder: Secondary | ICD-10-CM | POA: Insufficient documentation

## 2021-12-10 DIAGNOSIS — L900 Lichen sclerosus et atrophicus: Secondary | ICD-10-CM | POA: Insufficient documentation

## 2021-12-10 LAB — TSH RFX ON ABNORMAL TO FREE T4: TSH: 0.583 u[IU]/mL (ref 0.450–4.500)

## 2021-12-10 NOTE — Assessment & Plan Note (Signed)
Lichen sclerosis on exam, less likely yeast infection, trial topic steroid ointment, follow up PRN for biopsy if no improvement.

## 2021-12-10 NOTE — Assessment & Plan Note (Signed)
Check TSH, reflex free T4

## 2021-12-23 ENCOUNTER — Encounter: Payer: Self-pay | Admitting: Nurse Practitioner

## 2021-12-23 ENCOUNTER — Other Ambulatory Visit: Payer: Self-pay

## 2021-12-23 ENCOUNTER — Ambulatory Visit: Payer: Self-pay

## 2021-12-23 ENCOUNTER — Ambulatory Visit: Payer: Self-pay | Attending: Nurse Practitioner | Admitting: Nurse Practitioner

## 2021-12-23 DIAGNOSIS — R5382 Chronic fatigue, unspecified: Secondary | ICD-10-CM

## 2021-12-23 DIAGNOSIS — F419 Anxiety disorder, unspecified: Secondary | ICD-10-CM

## 2021-12-23 DIAGNOSIS — M2560 Stiffness of unspecified joint, not elsewhere classified: Secondary | ICD-10-CM

## 2021-12-23 MED ORDER — HYDROXYZINE HCL 10 MG PO TABS
10.0000 mg | ORAL_TABLET | Freq: Three times a day (TID) | ORAL | 0 refills | Status: DC | PRN
  Filled 2021-12-23: qty 60, 20d supply, fill #0

## 2021-12-23 NOTE — Progress Notes (Signed)
Virtual Visit Note  I discussed the limitations, risks, security and privacy concerns of performing an evaluation and management service by video and the availability of in person appointments. I also discussed with the patient that there may be a patient responsible charge related to this service. The patient expressed understanding and agreed to proceed.    I connected with Rhonda Avila on 12/23/21  at   8:30 AM EDT  EDT by VIDEO and verified that I am speaking with the correct person using two identifiers.   Location of Patient: Private Residence   [facility] Provider: [facility]    Persons participating in VIRTUAL visit: Bertram Denver FNP-BC Britiney Pura Spice [place] Slider    History of Present Illness: VIRTUAL visit for: Joint pain   Notes the following symptoms over the past few months:  Hair loss Fatigue, mood lability, decreased energy Body aches, joint pain and stiffness She is tired when she wakes up from sleep Constantly worrying.  Difficulty losing weight BMI 43.64   Lab Results  Component Value Date   WBC 9.6 11/14/2020   HGB 13.6 11/14/2020   HCT 41.1 11/14/2020   MCV 89 11/14/2020   PLT 256 11/14/2020    Lab Results  Component Value Date   TSH 0.583 12/09/2021    Lab Results  Component Value Date   HGBA1C 5.3 11/14/2020     Past Medical History:  Diagnosis Date   HSV (herpes simplex virus) infection    Pregnancy induced hypertension     Past Surgical History:  Procedure Laterality Date   NO PAST SURGERIES      Family History  Problem Relation Age of Onset   Hypertension Maternal Grandmother    Hypertension Mother     Social History   Socioeconomic History   Marital status: Single    Spouse name: Not on file   Number of children: Not on file   Years of education: Not on file   Highest education level: Not on file  Occupational History   Not on file  Tobacco Use   Smoking status:  Never   Smokeless tobacco: Never  Substance and Sexual Activity   Alcohol use: No   Drug use: Not Currently   Sexual activity: Yes    Comment: same partner  Other Topics Concern   Not on file  Social History Narrative   Not on file   Social Determinants of Health   Financial Resource Strain: Not on file  Food Insecurity: Not on file  Transportation Needs: Not on file  Physical Activity: Not on file  Stress: Not on file  Social Connections: Not on file     Observations/Objective: Awake, alert and oriented x 3   Review of Systems  Constitutional:  Positive for malaise/fatigue. Negative for fever and weight loss.  HENT: Negative.  Negative for nosebleeds.   Eyes: Negative.  Negative for blurred vision, double vision and photophobia.  Respiratory: Negative.  Negative for cough and shortness of breath.   Cardiovascular: Negative.  Negative for chest pain, palpitations and leg swelling.  Gastrointestinal: Negative.  Negative for heartburn, nausea and vomiting.  Musculoskeletal:  Positive for joint pain. Negative for myalgias.  Neurological: Negative.  Negative for dizziness, focal weakness, seizures and headaches.  Psychiatric/Behavioral:  Negative for suicidal ideas. The patient is nervous/anxious and has insomnia.     Assessment and Plan: Diagnoses and all orders for this visit:  Chronic fatigue -     CBC with Differential -  Iron, TIBC and Ferritin Panel -     VITAMIN D 25 Hydroxy (Vit-D Deficiency, Fractures) Focused today on healthier diet, increase exercise, less carbs, more protein, increase water intake  Anxiety -     hydrOXYzine (ATARAX) 10 MG tablet; Take 1 tablet (10 mg total) by mouth every 8 (eight) hours as needed.  Joint stiffness -     Uric A+ANA+RA Qn+CRP+ASO     Follow Up Instructions Return if symptoms worsen or fail to improve.     I discussed the assessment and treatment plan with the patient. The patient was provided an opportunity to ask  questions and all were answered. The patient agreed with the plan and demonstrated an understanding of the instructions.   The patient was advised to call back or seek an in-person evaluation if the symptoms worsen or if the condition fails to improve as anticipated.  I provided 20 minutes of face-to-face time during this encounter including median intraservice time, reviewing previous notes, labs, imaging, medications and explaining diagnosis and management.  Claiborne Rigg, FNP-BC

## 2021-12-24 ENCOUNTER — Encounter: Payer: Self-pay | Admitting: Nurse Practitioner

## 2021-12-24 ENCOUNTER — Other Ambulatory Visit: Payer: Self-pay

## 2021-12-24 LAB — CBC WITH DIFFERENTIAL/PLATELET
Basophils Absolute: 0.1 10*3/uL (ref 0.0–0.2)
Basos: 1 %
EOS (ABSOLUTE): 0.1 10*3/uL (ref 0.0–0.4)
Eos: 1 %
Hematocrit: 39 % (ref 34.0–46.6)
Hemoglobin: 13.4 g/dL (ref 11.1–15.9)
Immature Grans (Abs): 0.1 10*3/uL (ref 0.0–0.1)
Immature Granulocytes: 1 %
Lymphocytes Absolute: 2.8 10*3/uL (ref 0.7–3.1)
Lymphs: 35 %
MCH: 30.2 pg (ref 26.6–33.0)
MCHC: 34.4 g/dL (ref 31.5–35.7)
MCV: 88 fL (ref 79–97)
Monocytes Absolute: 0.5 10*3/uL (ref 0.1–0.9)
Monocytes: 6 %
Neutrophils Absolute: 4.5 10*3/uL (ref 1.4–7.0)
Neutrophils: 56 %
Platelets: 250 10*3/uL (ref 150–450)
RBC: 4.43 x10E6/uL (ref 3.77–5.28)
RDW: 12.8 % (ref 11.7–15.4)
WBC: 8 10*3/uL (ref 3.4–10.8)

## 2021-12-24 LAB — VITAMIN D 25 HYDROXY (VIT D DEFICIENCY, FRACTURES): Vit D, 25-Hydroxy: 28.4 ng/mL — ABNORMAL LOW (ref 30.0–100.0)

## 2021-12-24 LAB — URIC A+ANA+RA QN+CRP+ASO
ASO: 45 IU/mL (ref 0.0–200.0)
Anti Nuclear Antibody (ANA): NEGATIVE
CRP: 8 mg/L (ref 0–10)
Rheumatoid fact SerPl-aCnc: 10.4 IU/mL (ref <14.0)
Uric Acid: 4.7 mg/dL (ref 2.6–6.2)

## 2021-12-24 LAB — IRON,TIBC AND FERRITIN PANEL
Ferritin: 133 ng/mL (ref 15–150)
Iron Saturation: 29 % (ref 15–55)
Iron: 104 ug/dL (ref 27–159)
Total Iron Binding Capacity: 356 ug/dL (ref 250–450)
UIBC: 252 ug/dL (ref 131–425)

## 2021-12-26 ENCOUNTER — Other Ambulatory Visit: Payer: Self-pay | Admitting: Nurse Practitioner

## 2021-12-26 ENCOUNTER — Other Ambulatory Visit: Payer: Self-pay

## 2021-12-26 DIAGNOSIS — N760 Acute vaginitis: Secondary | ICD-10-CM

## 2021-12-26 MED ORDER — METRONIDAZOLE 500 MG PO TABS
500.0000 mg | ORAL_TABLET | Freq: Two times a day (BID) | ORAL | 0 refills | Status: AC
  Filled 2021-12-26: qty 14, 7d supply, fill #0

## 2021-12-26 MED ORDER — FLUCONAZOLE 150 MG PO TABS
150.0000 mg | ORAL_TABLET | Freq: Once | ORAL | 0 refills | Status: AC
  Filled 2021-12-26: qty 1, 1d supply, fill #0

## 2021-12-30 ENCOUNTER — Encounter: Payer: Self-pay | Admitting: Nurse Practitioner

## 2021-12-30 DIAGNOSIS — R5382 Chronic fatigue, unspecified: Secondary | ICD-10-CM

## 2021-12-30 NOTE — Progress Notes (Signed)
Ms. Rhonda Avila does not need this appt today. Labs were completed several days ago and were discussed. She has no concerns at the moment.

## 2022-01-01 ENCOUNTER — Ambulatory Visit: Payer: Self-pay | Admitting: Physician Assistant

## 2022-01-27 ENCOUNTER — Telehealth: Payer: Self-pay | Admitting: Family Medicine

## 2022-01-27 DIAGNOSIS — L900 Lichen sclerosus et atrophicus: Secondary | ICD-10-CM

## 2022-01-27 NOTE — Telephone Encounter (Signed)
Patient said the cream given to her at her last visit is not helping her.

## 2022-01-28 ENCOUNTER — Other Ambulatory Visit: Payer: Self-pay

## 2022-01-28 MED ORDER — BETAMETHASONE DIPROPIONATE 0.05 % EX OINT
TOPICAL_OINTMENT | Freq: Two times a day (BID) | CUTANEOUS | 0 refills | Status: DC
  Filled 2022-01-28 (×2): qty 30, 15d supply, fill #0

## 2022-01-28 NOTE — Telephone Encounter (Signed)
Called patient and informed her of new prescription sent to pharmacy as well as instructions for use. Discussed someone from our front office will call her with an appt to follow up with a provider for biopsy. Patient verbalized understanding.

## 2022-01-29 ENCOUNTER — Other Ambulatory Visit: Payer: Self-pay

## 2022-01-30 ENCOUNTER — Other Ambulatory Visit: Payer: Self-pay

## 2022-02-03 ENCOUNTER — Encounter: Payer: Self-pay | Admitting: Nurse Practitioner

## 2022-02-03 ENCOUNTER — Telehealth (HOSPITAL_BASED_OUTPATIENT_CLINIC_OR_DEPARTMENT_OTHER): Payer: Self-pay | Admitting: Nurse Practitioner

## 2022-02-03 DIAGNOSIS — L918 Other hypertrophic disorders of the skin: Secondary | ICD-10-CM

## 2022-02-03 NOTE — Progress Notes (Addendum)
Virtual Visit Note  I discussed the limitations, risks, security and privacy concerns of performing an evaluation and management service by video and the availability of in person appointments. I also discussed with the patient that there may be a patient responsible charge related to this service. The patient expressed understanding and agreed to proceed.    I connected with Rhonda Avila on 02/03/22  at  10:10 AM EDT  EDT by VIDEO and verified that I am speaking with the correct person using two identifiers.   Location of Patient: Private Residence   [facility] Provider[facility]    Persons participating in VIRTUAL visit: Rhonda Denver FNP-BC Kory Pura Spice [place] Slider    History of Present Illness: VIRTUAL visit for: Skin tags  [name] has several small skin tags on both sides of her neck that she would like removed. Associated symptoms include itching and irritation.  She also has concerns of discolored areas on her neck. I have instructed her that the area of concern on her neck is acanthosis nigricans.     Past Medical History:  Diagnosis Date   HSV (herpes simplex virus) infection    Pregnancy induced hypertension     Past Surgical History:  Procedure Laterality Date   NO PAST SURGERIES      Family History  Problem Relation Age of Onset   Hypertension Maternal Grandmother    Hypertension Mother     Social History   Socioeconomic History   Marital status: Single    Spouse name: Not on file   Number of children: Not on file   Years of education: Not on file   Highest education level: Not on file  Occupational History   Not on file  Tobacco Use   Smoking status: Never   Smokeless tobacco: Never  Substance and Sexual Activity   Alcohol use: No   Drug use: Not Currently   Sexual activity: Yes    Comment: same partner  Other Topics Concern   Not on file  Social History Narrative   Not on file    Social Determinants of Health   Financial Resource Strain: Not on file  Food Insecurity: Not on file  Transportation Needs: Not on file  Physical Activity: Not on file  Stress: Not on file  Social Connections: Not on file     Observations/Objective: Awake, alert and oriented x 3   Review of Systems  Constitutional:  Negative for fever, malaise/fatigue and weight loss.  HENT: Negative.  Negative for nosebleeds.   Eyes: Negative.  Negative for blurred vision, double vision and photophobia.  Respiratory: Negative.  Negative for cough and shortness of breath.   Cardiovascular: Negative.  Negative for chest pain, palpitations and leg swelling.  Gastrointestinal: Negative.  Negative for heartburn, nausea and vomiting.  Musculoskeletal: Negative.  Negative for myalgias.  Skin:        SEE HPI  Neurological: Negative.  Negative for dizziness, focal weakness, seizures and headaches.  Psychiatric/Behavioral: Negative.  Negative for suicidal ideas.     Assessment and Plan: Diagnoses and all orders for this visit:  Cutaneous skin tags -     Ambulatory referral to Dermatology     Follow Up Instructions Return if symptoms worsen or fail to improve.     I discussed the assessment and treatment plan with the patient. The patient was provided an opportunity to ask questions and all were answered. The patient agreed with the plan and demonstrated an understanding  of the instructions.   The patient was advised to call back or seek an in-person evaluation if the symptoms worsen or if the condition fails to improve as anticipated.  I provided 11 minutes of face-to-face time during this encounter including median intraservice time, reviewing previous notes, labs, imaging, medications and explaining diagnosis and management.  Claiborne Rigg, FNP-BC

## 2022-03-14 ENCOUNTER — Other Ambulatory Visit: Payer: Self-pay | Admitting: Nurse Practitioner

## 2022-03-14 ENCOUNTER — Encounter: Payer: Self-pay | Admitting: Nurse Practitioner

## 2022-03-14 DIAGNOSIS — L918 Other hypertrophic disorders of the skin: Secondary | ICD-10-CM

## 2022-03-19 ENCOUNTER — Ambulatory Visit: Payer: Self-pay | Admitting: Orthopaedic Surgery

## 2022-04-10 ENCOUNTER — Other Ambulatory Visit (HOSPITAL_COMMUNITY)
Admission: RE | Admit: 2022-04-10 | Discharge: 2022-04-10 | Disposition: A | Discharge status: Home / Self Care | Payer: Self-pay | Source: Ambulatory Visit | Attending: Obstetrics and Gynecology | Admitting: Obstetrics and Gynecology

## 2022-04-10 ENCOUNTER — Other Ambulatory Visit: Payer: Self-pay

## 2022-04-10 ENCOUNTER — Ambulatory Visit (INDEPENDENT_AMBULATORY_CARE_PROVIDER_SITE_OTHER): Payer: Self-pay | Admitting: Obstetrics and Gynecology

## 2022-04-10 ENCOUNTER — Encounter: Payer: Self-pay | Admitting: Obstetrics and Gynecology

## 2022-04-10 VITALS — BP 122/69 | HR 66 | Ht 62.0 in | Wt 244.1 lb

## 2022-04-10 DIAGNOSIS — L900 Lichen sclerosus et atrophicus: Secondary | ICD-10-CM

## 2022-04-10 DIAGNOSIS — L989 Disorder of the skin and subcutaneous tissue, unspecified: Secondary | ICD-10-CM | POA: Insufficient documentation

## 2022-04-10 DIAGNOSIS — N760 Acute vaginitis: Secondary | ICD-10-CM

## 2022-04-10 DIAGNOSIS — N898 Other specified noninflammatory disorders of vagina: Secondary | ICD-10-CM | POA: Insufficient documentation

## 2022-04-10 NOTE — Progress Notes (Signed)
VULVAR BIOPSY NOTE The indications for vulvar biopsy (rule out neoplasia, establish lichen sclerosus diagnosis) were reviewed.   Risks of the biopsy including pain, bleeding, infection, inadequate specimen, scarring and need for additional procedures  were discussed. The patient stated understanding and agreed to undergo procedure today. Consent was signed,  time out performed.   Chart was reviewed with concern for lichen sclerosis from previous MD.  Upon my exam even though there is slight hypopigmentation in the inferior inguinal crease I do not believe it represents classical lichen sclerosis. The skin is uniformly thickened, but there is no erythema.  Multiple small excoriated areas are noted on the bilateral vulva from the patient's scratching. Pt was prescribed topical corticosteroids which were ineffective.  Pt also complains of vaginal odor so a vaginal swab was taken.     The patient's vulva was prepped with Betadine. 1% lidocaine was injected into the right right inferior vulvar area.. A 4-mm punch biopsy was done, biopsy tissue was picked up with sterile forceps and sterile scissors were used to excise the lesion.  Small bleeding was noted and hemostasis was achieved using silver nitrate sticks.  The patient tolerated the procedure well. Post-procedure instructions  (pelvic rest for one week) were given to the patient. The patient is to call with heavy bleeding, fever greater than 100.4, foul smelling vaginal discharge or other concerns.   Will follow up pathology results, but will also refer to dermatology.   Lynnda Shields, MD Faculty attending ,[facility]

## 2022-04-13 LAB — CERVICOVAGINAL ANCILLARY ONLY
Bacterial Vaginitis (gardnerella): POSITIVE — AB
Candida Glabrata: NEGATIVE
Candida Vaginitis: POSITIVE — AB
Comment: NEGATIVE
Comment: NEGATIVE
Comment: NEGATIVE
Comment: NEGATIVE
Trichomonas: NEGATIVE

## 2022-04-15 ENCOUNTER — Telehealth: Payer: Self-pay | Admitting: General Practice

## 2022-04-15 ENCOUNTER — Other Ambulatory Visit: Payer: Self-pay

## 2022-04-15 DIAGNOSIS — B9689 Other specified bacterial agents as the cause of diseases classified elsewhere: Secondary | ICD-10-CM

## 2022-04-15 DIAGNOSIS — B379 Candidiasis, unspecified: Secondary | ICD-10-CM

## 2022-04-15 MED ORDER — METRONIDAZOLE 500 MG PO TABS
500.0000 mg | ORAL_TABLET | Freq: Two times a day (BID) | ORAL | 0 refills | Status: DC
  Filled 2022-04-15: qty 14, 7d supply, fill #0

## 2022-04-15 MED ORDER — FLUCONAZOLE 150 MG PO TABS
150.0000 mg | ORAL_TABLET | Freq: Once | ORAL | 0 refills | Status: AC
  Filled 2022-04-15: qty 1, 1d supply, fill #0

## 2022-04-15 NOTE — Telephone Encounter (Signed)
Called patient and informed her of results and medications sent to pharmacy as well as directions of use. Patient verbalized understanding to all.

## 2022-04-15 NOTE — Telephone Encounter (Signed)
-----   Message from Griffin Basil, MD sent at 04/15/2022  8:46 AM EDT ----- BV and yeast noted on swab, offer treatment

## 2022-04-16 LAB — SURGICAL PATHOLOGY

## 2022-04-20 ENCOUNTER — Encounter: Payer: Self-pay | Admitting: General Practice

## 2022-04-21 ENCOUNTER — Encounter: Payer: Self-pay | Admitting: *Deleted

## 2022-04-21 ENCOUNTER — Other Ambulatory Visit: Payer: Self-pay

## 2022-04-21 ENCOUNTER — Telehealth: Payer: Self-pay | Admitting: *Deleted

## 2022-04-21 NOTE — Telephone Encounter (Signed)
Patient left a message requesting results of a biopsy that was done. Rhonda Avila

## 2022-04-21 NOTE — Telephone Encounter (Signed)
I called Rhonda Avila and left a message I was returning her call to discuss her questions and since I did not reach you, I will send a detailed MyChart message. If you still have questions, please reply or call us back. Staci Acosta

## 2022-05-06 ENCOUNTER — Other Ambulatory Visit: Payer: Self-pay

## 2022-09-07 ENCOUNTER — Other Ambulatory Visit (HOSPITAL_COMMUNITY): Payer: Self-pay

## 2022-10-05 ENCOUNTER — Encounter: Payer: Self-pay | Admitting: Nurse Practitioner

## 2022-10-06 ENCOUNTER — Other Ambulatory Visit: Payer: Self-pay | Admitting: Nurse Practitioner

## 2022-10-28 ENCOUNTER — Other Ambulatory Visit: Payer: Self-pay

## 2022-10-28 ENCOUNTER — Ambulatory Visit: Payer: Self-pay | Attending: Family Medicine | Admitting: Nurse Practitioner

## 2022-10-28 ENCOUNTER — Encounter: Payer: Self-pay | Admitting: Nurse Practitioner

## 2022-10-28 VITALS — BP 139/84 | HR 87 | Ht 62.0 in | Wt 254.0 lb

## 2022-10-28 DIAGNOSIS — G8929 Other chronic pain: Secondary | ICD-10-CM

## 2022-10-28 DIAGNOSIS — M25561 Pain in right knee: Secondary | ICD-10-CM

## 2022-10-28 DIAGNOSIS — Z13220 Encounter for screening for lipoid disorders: Secondary | ICD-10-CM

## 2022-10-28 DIAGNOSIS — R221 Localized swelling, mass and lump, neck: Secondary | ICD-10-CM

## 2022-10-28 DIAGNOSIS — Z862 Personal history of diseases of the blood and blood-forming organs and certain disorders involving the immune mechanism: Secondary | ICD-10-CM

## 2022-10-28 DIAGNOSIS — E282 Polycystic ovarian syndrome: Secondary | ICD-10-CM

## 2022-10-28 DIAGNOSIS — R5382 Chronic fatigue, unspecified: Secondary | ICD-10-CM

## 2022-10-28 MED ORDER — METFORMIN HCL 500 MG PO TABS
500.0000 mg | ORAL_TABLET | Freq: Two times a day (BID) | ORAL | 3 refills | Status: DC
  Filled 2022-10-28: qty 60, 30d supply, fill #0
  Filled 2022-12-21 (×2): qty 60, 30d supply, fill #1
  Filled 2023-03-27 – 2023-03-29 (×2): qty 60, 30d supply, fill #2
  Filled 2023-05-31: qty 60, 30d supply, fill #3
  Filled 2023-06-30 – 2023-07-01 (×3): qty 60, 30d supply, fill #4
  Filled 2023-07-26: qty 60, 30d supply, fill #5
  Filled 2023-10-26: qty 60, 30d supply, fill #6

## 2022-10-28 MED ORDER — MELOXICAM 7.5 MG PO TABS
7.5000 mg | ORAL_TABLET | Freq: Every day | ORAL | 1 refills | Status: DC
  Filled 2022-10-28: qty 30, 30d supply, fill #0
  Filled 2022-12-21 (×2): qty 30, 30d supply, fill #1

## 2022-10-28 NOTE — Progress Notes (Signed)
Assessment & Plan:  Rhonda Avila was seen today for knee pain.  Diagnoses and all orders for this visit:  History of anemia -     CBC with Differential  Low calcium levels -     CMP14+EGFR  Neck fullness -     Thyroid Panel With TSH  PCOS (polycystic ovarian syndrome) -     metFORMIN (GLUCOPHAGE) 500 MG tablet; Take 1 tablet (500 mg total) by mouth 2 (two) times daily with a meal.  Lipid screening -     Lipid panel  Chronic fatigue -     Vitamin B12  Chronic pain of right knee -     meloxicam (MOBIC) 7.5 MG tablet; Take 1 tablet (7.5 mg total) by mouth daily.    Patient has been counseled on age-appropriate routine health concerns for screening and prevention. These are reviewed and up-to-date. Referrals have been placed accordingly. Immunizations are up-to-date or declined.    Subjective:   Chief Complaint  Patient presents with   Knee Pain   HPI Rhonda Avila 32 y.o. female presents to office today for chronic B/L knee pain    Right knee pain BMI 46. Notes bilateral knee pain R>L. Denies any injury or trauma. Knee sleeve and NSAIDs ineffective.  Symptoms worse with bending and kneeling  KNEE XRAY 08-21-2021 Normal bone mineralization. Joint spaces are preserved. No joint effusion. No acute fracture or dislocation.  IMPRESSION: Normal right knee radiographs.  Questions fertility. Notes irregular menstrual cycles. Last period April, September, December, May (last month). She does have a history of PCOS and has been on metformin in the past.   Does not feel like herself. Fatigue. Weight gain. Lack of motivation with household chores. States this is new for her. Wants to have her thyroid check as well as B12.   Review of Systems  Constitutional:  Positive for malaise/fatigue. Negative for fever and weight loss.       Weight gain  HENT: Negative.  Negative for nosebleeds.   Eyes: Negative.  Negative for blurred vision, double vision and photophobia.   Respiratory: Negative.  Negative for cough and shortness of breath.   Cardiovascular: Negative.  Negative for chest pain, palpitations and leg swelling.  Gastrointestinal: Negative.  Negative for heartburn, nausea and vomiting.  Musculoskeletal:  Positive for joint pain. Negative for myalgias.  Neurological: Negative.  Negative for dizziness, focal weakness, seizures and headaches.  Psychiatric/Behavioral: Negative.  Negative for suicidal ideas.     Past Medical History:  Diagnosis Date   HSV (herpes simplex virus) infection    Pregnancy induced hypertension     Past Surgical History:  Procedure Laterality Date   NO PAST SURGERIES      Family History  Problem Relation Age of Onset   Hypertension Mother    Diabetes Mother    Hypertension Maternal Grandmother     Social History Reviewed with no changes to be made today.   Outpatient Medications Prior to Visit  Medication Sig Dispense Refill   hydrOXYzine (ATARAX) 10 MG tablet Take 1 tablet (10 mg total) by mouth every 8 (eight) hours as needed. (Patient not taking: Reported on 04/10/2022) 60 tablet 0   betamethasone dipropionate (DIPROLENE) 0.05 % ointment Apply topically 2 (two) times daily. (Patient not taking: Reported on 04/10/2022) 30 g 0   cyclobenzaprine (FLEXERIL) 5 MG tablet Take 1 tablet (5 mg total) by mouth 2 (two) times daily as needed for muscle spasms. (Patient not taking: Reported on 04/10/2022) 20 tablet  0   gabapentin (NEURONTIN) 300 MG capsule Take 1 capsule (300 mg total) by mouth at bedtime. 90 capsule 0   hydrocortisone (ANUSOL-HC) 2.5 % rectal cream Place 1 application rectally 2 (two) times daily. (Patient not taking: Reported on 04/10/2022) 60 g 0   ibuprofen (ADVIL) 600 MG tablet Take 1 tablet (600 mg total) by mouth every 6 (six) hours as needed for mild pain. (Patient not taking: Reported on 10/28/2022) 30 tablet 0   Lidocaine 3 % CREA Apply 1 application topically daily as needed. Apply to external  vagina only (Patient not taking: Reported on 04/10/2022) 30 g 1   meloxicam (MOBIC) 7.5 MG tablet Take 1 tablet (7.5 mg total) by mouth daily. (Patient not taking: Reported on 04/10/2022) 30 tablet 0   metroNIDAZOLE (FLAGYL) 500 MG tablet Take 1 tablet (500 mg total) by mouth 2 (two) times daily. (Patient not taking: Reported on 10/28/2022) 14 tablet 0   No facility-administered medications prior to visit.    No Known Allergies     Objective:    BP 139/84   Pulse 87   Ht 5\' 2"  (1.575 m)   Wt 254 lb (115.2 kg)   LMP 10/19/2022 (Exact Date)   SpO2 98%   BMI 46.46 kg/m  Wt Readings from Last 3 Encounters:  10/28/22 254 lb (115.2 kg)  04/10/22 244 lb 1.6 oz (110.7 kg)  12/09/21 238 lb 9.6 oz (108.2 kg)    Physical Exam Vitals and nursing note reviewed.  Constitutional:      Appearance: She is well-developed.  HENT:     Head: Normocephalic and atraumatic.  Cardiovascular:     Rate and Rhythm: Normal rate and regular rhythm.     Heart sounds: Normal heart sounds. No murmur heard.    No friction rub. No gallop.  Pulmonary:     Effort: Pulmonary effort is normal. No tachypnea or respiratory distress.     Breath sounds: Normal breath sounds. No decreased breath sounds, wheezing, rhonchi or rales.  Chest:     Chest wall: No tenderness.  Abdominal:     General: Bowel sounds are normal.     Palpations: Abdomen is soft.  Musculoskeletal:        General: Normal range of motion.     Cervical back: Normal range of motion.  Skin:    General: Skin is warm and dry.  Neurological:     Mental Status: She is alert and oriented to person, place, and time.     Coordination: Coordination normal.  Psychiatric:        Behavior: Behavior normal. Behavior is cooperative.        Thought Content: Thought content normal.        Judgment: Judgment normal.          Patient has been counseled extensively about nutrition and exercise as well as the importance of adherence with medications  and regular follow-up. The patient was given clear instructions to go to ER or return to medical center if symptoms don't improve, worsen or new problems develop. The patient verbalized understanding.   Follow-up: Return if symptoms worsen or fail to improve.   Claiborne Rigg, FNP-BC [facility] [place], [place] [phone]   10/30/2022, 9:11 PM

## 2022-10-29 LAB — LIPID PANEL
Chol/HDL Ratio: 3 ratio (ref 0.0–4.4)
Cholesterol, Total: 172 mg/dL (ref 100–199)
HDL: 57 mg/dL (ref >39)
LDL Chol Calc (NIH): 89 mg/dL (ref 0–99)
Triglycerides: 150 mg/dL — ABNORMAL HIGH (ref 0–149)
VLDL Cholesterol Cal: 26 mg/dL (ref 5–40)

## 2022-10-29 LAB — CMP14+EGFR
ALT: 27 IU/L (ref 0–32)
AST: 17 IU/L (ref 0–40)
Albumin/Globulin Ratio: 1.6 (ref 1.2–2.2)
Albumin: 4.4 g/dL (ref 3.9–4.9)
Alkaline Phosphatase: 102 IU/L (ref 44–121)
BUN/Creatinine Ratio: 13 (ref 9–23)
BUN: 10 mg/dL (ref 6–20)
Bilirubin Total: 0.2 mg/dL (ref 0.0–1.2)
CO2: 24 mmol/L (ref 20–29)
Calcium: 9.6 mg/dL (ref 8.7–10.2)
Chloride: 103 mmol/L (ref 96–106)
Creatinine, Ser: 0.78 mg/dL (ref 0.57–1.00)
Globulin, Total: 2.8 g/dL (ref 1.5–4.5)
Glucose: 89 mg/dL (ref 70–99)
Potassium: 4.2 mmol/L (ref 3.5–5.2)
Sodium: 140 mmol/L (ref 134–144)
Total Protein: 7.2 g/dL (ref 6.0–8.5)
eGFR: 104 mL/min/{1.73_m2} (ref >59)

## 2022-10-29 LAB — THYROID PANEL WITH TSH
Free Thyroxine Index: 1.8 (ref 1.2–4.9)
T3 Uptake Ratio: 24 % (ref 24–39)
T4, Total: 7.7 ug/dL (ref 4.5–12.0)
TSH: 1.37 u[IU]/mL (ref 0.450–4.500)

## 2022-10-29 LAB — CBC WITH DIFFERENTIAL/PLATELET
Basophils Absolute: 0 10*3/uL (ref 0.0–0.2)
Basos: 0 %
EOS (ABSOLUTE): 0.1 10*3/uL (ref 0.0–0.4)
Eos: 1 %
Hematocrit: 40.6 % (ref 34.0–46.6)
Hemoglobin: 13.6 g/dL (ref 11.1–15.9)
Immature Grans (Abs): 0.1 10*3/uL (ref 0.0–0.1)
Immature Granulocytes: 1 %
Lymphocytes Absolute: 3.5 10*3/uL — ABNORMAL HIGH (ref 0.7–3.1)
Lymphs: 38 %
MCH: 29.5 pg (ref 26.6–33.0)
MCHC: 33.5 g/dL (ref 31.5–35.7)
MCV: 88 fL (ref 79–97)
Monocytes Absolute: 0.6 10*3/uL (ref 0.1–0.9)
Monocytes: 6 %
Neutrophils Absolute: 5 10*3/uL (ref 1.4–7.0)
Neutrophils: 54 %
Platelets: 257 10*3/uL (ref 150–450)
RBC: 4.61 x10E6/uL (ref 3.77–5.28)
RDW: 12.4 % (ref 11.7–15.4)
WBC: 9.3 10*3/uL (ref 3.4–10.8)

## 2022-10-29 LAB — VITAMIN B12: Vitamin B-12: 862 pg/mL (ref 232–1245)

## 2022-10-30 ENCOUNTER — Encounter: Payer: Self-pay | Admitting: Nurse Practitioner

## 2022-11-30 ENCOUNTER — Ambulatory Visit: Payer: Self-pay | Admitting: Nurse Practitioner

## 2022-12-21 ENCOUNTER — Other Ambulatory Visit: Payer: Self-pay

## 2023-03-25 ENCOUNTER — Other Ambulatory Visit: Payer: Self-pay

## 2023-03-25 ENCOUNTER — Ambulatory Visit: Payer: Self-pay | Admitting: Physician Assistant

## 2023-03-25 ENCOUNTER — Encounter: Payer: Self-pay | Admitting: Physician Assistant

## 2023-03-25 DIAGNOSIS — B372 Candidiasis of skin and nail: Secondary | ICD-10-CM

## 2023-03-25 MED ORDER — NYSTATIN 100000 UNIT/GM EX CREA
1.0000 | TOPICAL_CREAM | Freq: Two times a day (BID) | CUTANEOUS | 0 refills | Status: DC
  Filled 2023-03-25: qty 60, 30d supply, fill #0

## 2023-03-25 MED ORDER — METHYLPREDNISOLONE ACETATE 80 MG/ML IJ SUSP
80.0000 mg | Freq: Once | INTRAMUSCULAR | Status: AC
  Administered 2023-03-25: 80 mg via INTRAMUSCULAR

## 2023-03-25 NOTE — Patient Instructions (Signed)
You are going to use nystatin cream twice daily until resolved.  I encourage you to work on keeping the area clean and dry.  After this is resolved, you can use a maintenance powder such as Goldbond powder, also consider using Selsun Blue as a wash twice weekly in the shower.  Please let us know if there is anything else we can do for you  Roney Jaffe, PA-C Physician Assistant [facility] [email or web address]  Skin Yeast Infection  A skin yeast infection is a condition in which there is an overgrowth of yeast (Candida) that normally lives on the skin. This condition usually occurs in areas of the skin that are constantly warm and moist, such as the skin under the breasts or armpits, or in the groin and other body folds. What are the causes? This condition is caused by a change in the normal balance of the yeast that live on the skin. What increases the risk? You are more likely to develop this condition if you: Are obese. Are pregnant. Are 72 years of age or older. Wear tight clothing. Have any of the following conditions: Diabetes. Malnutrition. A weak body defense system (immune system). Take medicines such as: Birth control pills. Antibiotics. Steroid medicines. What are the signs or symptoms? The most common symptom of this condition is itchiness in the affected area. Other symptoms include: A red, swollen area of the skin. Bumps on the skin. How is this diagnosed? This condition is diagnosed with a medical history and physical exam. Your health care provider may check for yeast by taking scrapings of the skin to be viewed under a microscope. How is this treated? This condition is treated with medicine. Medicines may be prescribed or available over the counter. The medicines may be: Taken by mouth (orally). Applied as a cream or powder to your skin. Follow these instructions at home:  Take or apply over-the-counter  and prescription medicines only as told by your health care provider. Maintain a healthy weight. If you need help losing weight, talk with your health care provider. Keep your skin clean and dry. Wear loose-fitting clothing. If you have diabetes, keep your blood sugar under control. Keep all follow-up visits. This is important. Contact a health care provider if: Your symptoms go away and then come back. Your symptoms do not get better with treatment. Your symptoms get worse. Your rash spreads. You have a fever or chills. You have new symptoms. You have new warmth or redness of your skin. Your rash is painful or bleeding. Summary A skin yeast infection is a condition in which there is an overgrowth of yeast (Candida) that normally lives on the skin. Take or apply over-the-counter and prescription medicines only as told by your health care provider. Keep your skin clean and dry. Contact a health care provider if your symptoms do not get better with treatment. This information is not intended to replace advice given to you by your health care provider. Make sure you discuss any questions you have with your health care provider. Document Revised: 08/20/2020 Document Reviewed: 08/20/2020 Elsevier Patient Education  2024 [employer].

## 2023-03-25 NOTE — Progress Notes (Signed)
Established Patient Office Visit  Subjective   Patient ID: Rhonda Avila, female    DOB: [birth year]  Age: 32 y.o. MRN: [id number]  Chief Complaint  Patient presents with   Rash    Under both arms,     States that she has been experiencing a extremely itchy rash under both arms for the past 2 weeks.  Denies any new detergents, body washes, deodorants, lotions, fragrances, medications.  States that she has tried hydrocortisone without relief.       Past Medical History:  Diagnosis Date   HSV (herpes simplex virus) infection    Pregnancy induced hypertension    Social History   Socioeconomic History   Marital status: Single    Spouse name: Not on file   Number of children: Not on file   Years of education: Not on file   Highest education level: 12th grade  Occupational History   Not on file  Tobacco Use   Smoking status: Never    Passive exposure: Never   Smokeless tobacco: Never  Vaping Use   Vaping status: Never Used  Substance and Sexual Activity   Alcohol use: Yes   Drug use: Not Currently   Sexual activity: Yes    Comment: same partner  Other Topics Concern   Not on file  Social History Narrative   Not on file   Social Determinants of Health   Financial Resource Strain: High Risk (10/27/2022)   Overall Financial Resource Strain (CARDIA)    Difficulty of Paying Living Expenses: Very hard  Food Insecurity: Food Insecurity Present (10/27/2022)   Hunger Vital Sign    Worried About Running Out of Food in the Last Year: Sometimes true    Ran Out of Food in the Last Year: Sometimes true  Transportation Needs: No Transportation Needs (10/27/2022)   PRAPARE - Administrator, Civil Service (Medical): No    Lack of Transportation (Non-Medical): No  Physical Activity: Insufficiently Active (10/27/2022)   Exercise Vital Sign    Days of Exercise per Week: 4 days    Minutes of Exercise per Session: 20 min  Stress: Stress Concern  Present (10/27/2022)   [employer] of Occupational Health - Occupational Stress Questionnaire    Feeling of Stress : Very much  Social Connections: Moderately Integrated (10/27/2022)   Social Connection and Isolation Panel [NHANES]    Frequency of Communication with Friends and Family: Twice a week    Frequency of Social Gatherings with Friends and Family: Twice a week    Attends Religious Services: 1 to 4 times per year    Active Member of [employer] or Organizations: No    Attends Engineer, structural: Not on file    Marital Status: Living with partner  Intimate Partner Violence: Not on file   Family History  Problem Relation Age of Onset   Hypertension Mother    Diabetes Mother    Hypertension Maternal Grandmother    No Known Allergies  Review of Systems  Constitutional: Negative.   HENT: Negative.    Eyes: Negative.   Respiratory:  Negative for shortness of breath.   Cardiovascular:  Negative for chest pain.  Gastrointestinal: Negative.   Genitourinary: Negative.   Musculoskeletal: Negative.   Skin:  Positive for itching and rash.  Neurological: Negative.   Endo/Heme/Allergies: Negative.   Psychiatric/Behavioral: Negative.           Objective:     BP 135/77 (BP Location: Left Arm, Patient Position:  Sitting, Cuff Size: Large)   Pulse 83   Ht 5\' 2"  (1.575 m)   Wt 241 lb (109.3 kg)   LMP 02/16/2023   SpO2 97%   BMI 44.08 kg/m  BP Readings from Last 3 Encounters:  03/25/23 135/77  10/28/22 139/84  04/10/22 122/69   Wt Readings from Last 3 Encounters:  03/25/23 241 lb (109.3 kg)  10/28/22 254 lb (115.2 kg)  04/10/22 244 lb 1.6 oz (110.7 kg)    Physical Exam Vitals and nursing note reviewed.  Constitutional:      Appearance: Normal appearance.  HENT:     Head: Normocephalic and atraumatic.     Right Ear: External ear normal.     Left Ear: External ear normal.     Nose: Nose normal.     Mouth/Throat:     Mouth: Mucous membranes are moist.      Pharynx: Oropharynx is clear.  Eyes:     Extraocular Movements: Extraocular movements intact.     Conjunctiva/sclera: Conjunctivae normal.     Pupils: Pupils are equal, round, and reactive to light.  Cardiovascular:     Rate and Rhythm: Normal rate and regular rhythm.     Pulses: Normal pulses.     Heart sounds: Normal heart sounds.  Pulmonary:     Effort: Pulmonary effort is normal.     Breath sounds: Normal breath sounds.  Musculoskeletal:        General: Normal range of motion.     Cervical back: Normal range of motion and neck supple.  Skin:    General: Skin is warm.     Findings: Rash is not pustular.     Comments: See photos Red beefy rash noted both axilla  Neurological:     General: No focal deficit present.     Mental Status: She is alert and oriented to person, place, and time.  Psychiatric:        Mood and Affect: Mood normal.        Behavior: Behavior normal.        Thought Content: Thought content normal.        Judgment: Judgment normal.        Assessment & Plan:   Problem List Items Addressed This Visit   None Visit Diagnoses     Skin yeast infection       Relevant Medications   nystatin cream (MYCOSTATIN)   methylPREDNISolone acetate (DEPO-MEDROL) injection 80 mg      1. Skin yeast infection Trial Mycostatin.  Patient education given on supportive care, red flags given for prompt reevaluation. - nystatin cream (MYCOSTATIN); Apply 1 Application topically 2 (two) times daily.  Dispense: 60 g; Refill: 0 - methylPREDNISolone acetate (DEPO-MEDROL) injection 80 mg   I have reviewed the patient's medical history (PMH, PSH, Social History, Family History, Medications, and allergies) , and have been updated if relevant. I spent 30 minutes reviewing chart and  face to face time with patient.   Return if symptoms worsen or fail to improve.    Kasandra Knudsen Mayers, PA-C

## 2023-03-27 ENCOUNTER — Other Ambulatory Visit: Payer: Self-pay | Admitting: Nurse Practitioner

## 2023-03-27 DIAGNOSIS — G8929 Other chronic pain: Secondary | ICD-10-CM

## 2023-03-29 ENCOUNTER — Ambulatory Visit (INDEPENDENT_AMBULATORY_CARE_PROVIDER_SITE_OTHER): Payer: Self-pay | Admitting: Obstetrics and Gynecology

## 2023-03-29 ENCOUNTER — Other Ambulatory Visit: Payer: Self-pay

## 2023-03-29 ENCOUNTER — Encounter: Payer: Self-pay | Admitting: Obstetrics and Gynecology

## 2023-03-29 VITALS — BP 120/89 | HR 70 | Ht 62.0 in | Wt 237.5 lb

## 2023-03-29 DIAGNOSIS — Z3189 Encounter for other procreative management: Secondary | ICD-10-CM

## 2023-03-29 MED ORDER — MELOXICAM 7.5 MG PO TABS
7.5000 mg | ORAL_TABLET | Freq: Every day | ORAL | 0 refills | Status: DC
  Filled 2023-03-29 – 2023-04-09 (×2): qty 30, 30d supply, fill #0

## 2023-03-29 NOTE — Telephone Encounter (Signed)
Requested medication (s) are due for refill today: yes  Requested medication (s) are on the active medication list: yes  Last refill:  10/28/22  Future visit scheduled: yes  Notes to clinic:  Patient not taking: Reported on 03/29/2023, routing for review.     Requested Prescriptions  Pending Prescriptions Disp Refills   meloxicam (MOBIC) 7.5 MG tablet 30 tablet 1    Sig: Take 1 tablet (7.5 mg total) by mouth daily.     Analgesics:  COX2 Inhibitors Failed - 03/27/2023 10:31 PM      Failed - Manual Review: Labs are only required if the patient has taken medication for more than 8 weeks.      Passed - HGB in normal range and within 360 days    Hemoglobin  Date Value Ref Range Status  10/28/2022 13.6 11.1 - 15.9 g/dL Final         Passed - Cr in normal range and within 360 days    Creat  Date Value Ref Range Status  07/10/2013 0.79 0.50 - 1.10 mg/dL Final   Creatinine, Ser  Date Value Ref Range Status  10/28/2022 0.78 0.57 - 1.00 mg/dL Final   Creatinine, Urine  Date Value Ref Range Status  06/18/2018 54.00 mg/dL Final         Passed - HCT in normal range and within 360 days    Hematocrit  Date Value Ref Range Status  10/28/2022 40.6 34.0 - 46.6 % Final         Passed - AST in normal range and within 360 days    AST  Date Value Ref Range Status  10/28/2022 17 0 - 40 IU/L Final         Passed - ALT in normal range and within 360 days    ALT  Date Value Ref Range Status  10/28/2022 27 0 - 32 IU/L Final         Passed - eGFR is 30 or above and within 360 days    GFR, Est African American  Date Value Ref Range Status  07/10/2013 >89 mL/min Final   GFR calc Af Amer  Date Value Ref Range Status  12/06/2019 138 >59 mL/min/1.73 Final    Comment:    **[facility] currently reports eGFR in compliance with the current**   recommendations of the [employer]. [facility] will   update reporting as new guidelines are published from the NKF-ASN   Task  force.    GFR, Est Non African American  Date Value Ref Range Status  07/10/2013 >89 mL/min Final    Comment:      The estimated GFR is a calculation valid for adults (>=26 years old) that uses the CKD-EPI algorithm to adjust for age and sex. It is   not to be used for children, pregnant women, hospitalized patients,    patients on dialysis, or with rapidly changing kidney function. According to the NKDEP, eGFR >89 is normal, 60-89 shows mild impairment, 30-59 shows moderate impairment, 15-29 shows severe impairment and <15 is ESRD.     GFR calc non Af Amer  Date Value Ref Range Status  12/06/2019 120 >59 mL/min/1.73 Final   eGFR  Date Value Ref Range Status  10/28/2022 104 >59 mL/min/1.73 Final         Passed - Patient is not pregnant      Passed - Valid encounter within last 12 months    Recent Outpatient Visits  5 months ago History of anemia   [place] [facility], Shea Stakes, NP   1 year ago Cutaneous skin tags   [facility] [place], Shea Stakes, NP   1 year ago Chronic fatigue   [place] [facility] [place], Shea Stakes, NP   1 year ago Dermatitis   [facility] Claiborne Rigg, NP   1 year ago Chronic pain of right knee   [facility] Claiborne Rigg, NP       Future Appointments             In 4 weeks Claiborne Rigg, NP [facility]

## 2023-03-29 NOTE — Progress Notes (Signed)
Hx of pcos Irregular menses Menses in December, Menses in may , no menses until spetember and it lasted 18 days   CC: fertility concerns Subjective:    Patient ID: Rhonda Avila, female    DOB: 07-Feb-1991, 32 y.o.   MRN: [id number]  HPI 32 yo G1P1 seen for discussion of fertility concerns.  Pt has a history of PCOS and notes irregular menses.  Pt had menses in December 2023 and had nor further menses until May 2024.  Pt had no menses this summer until September and it lasted 18 days.  Pt does note she needed fertility assistance to get pregnant the first time with a specialist.  Pt already is on metformin.  Advise weight loss due to increased BMI to decrease insulin resistance.  Pt will likely need more assistance than clomid to achieve pregnancy.   Pt also c/o of irritation under both underarms.  Pt was seen at a mobile urgent care and was prescribed nystatin ointment, but never picked it up.   Review of Systems     Objective:   Physical Exam Vitals:   03/29/23 0912  BP: 120/89  Pulse: 70    Erythematous slightly raised lesions suspicious for tinea corporis along with roughened skin    Assessment & Plan:   1. Encounter for fertility planning Will refer to [facility] as they helped the patient get pregnant previously. - Ambulatory referral to Endocrinology  2. Skin lesion: pick up nystatin rx and follow follow up with PCP as needed.  I spent 10 minutes dedicated to the care of this patient including previsit review of records, face to face time with the patient discussing treatment options and post visit testing.   Warden Fillers, MD Faculty Attending, Center for [facility]

## 2023-04-06 ENCOUNTER — Other Ambulatory Visit: Payer: Self-pay | Admitting: Physician Assistant

## 2023-04-06 DIAGNOSIS — B372 Candidiasis of skin and nail: Secondary | ICD-10-CM

## 2023-04-08 ENCOUNTER — Other Ambulatory Visit: Payer: Self-pay

## 2023-04-09 ENCOUNTER — Other Ambulatory Visit: Payer: Self-pay

## 2023-04-09 ENCOUNTER — Ambulatory Visit: Payer: Self-pay

## 2023-04-09 NOTE — Telephone Encounter (Signed)
Patient called, unable to leave VM to return the call to the office to speak to the NT due to mailbox being full.     Summary: Rash and drainage   Rash under both arms with drainage.  Medication prescribed work a little nystatin cream (MYCOSTATIN), but may need something stronger. Please advise

## 2023-04-09 NOTE — Telephone Encounter (Signed)
Reason for Disposition  Localized rash present > 7 days    Requesting something stronger be called in for the rash under both of her arms.   The Mycostatin helped some but the rash is now bad as ever and still draining and it itches real bad.  Answer Assessment - Initial Assessment Questions 1. APPEARANCE of RASH: "Describe the rash."      The medicine Mycostatin is not working very well on the rash under my arms.   I need a refill of something stronger.    Can she call in something stronger for me?    Send it to the pharmacy there at the office.    2. LOCATION: "Where is the rash located?"      Under both of my arms 3. NUMBER: "How many spots are there?"      It feels like something is eating me.    I scratch a lot.   I don't want to spread this rash to anyone else.  I don't always have hand sanitizer with me. 4. SIZE: "How big are the spots?" (Inches, centimeters or compare to size of a coin)      Not asked since been evaluated for it 5. ONSET: "When did the rash start?"      Not asked 6. ITCHING: "Does the rash itch?" If Yes, ask: "How bad is the itch?"  (Scale 0-10; or none, mild, moderate, severe)     Yes   It itches real bad.   I try not to scratch but sometimes I do. 7. PAIN: "Does the rash hurt?" If Yes, ask: "How bad is the pain?"  (Scale 0-10; or none, mild, moderate, severe)    - NONE (0): no pain    - MILD (1-3): doesn't interfere with normal activities     - MODERATE (4-7): interferes with normal activities or awakens from sleep     - SEVERE (8-10): excruciating pain, unable to do any normal activities     It's still draining like it was.   It did get some better and stopped draining but now it's back bad as ever.   I feel like I need a stronger medicine. 8. OTHER SYMPTOMS: "Do you have any other symptoms?" (e.g., fever)     No 9. PREGNANCY: "Is there any chance you are pregnant?" "When was your last menstrual period?"     Not asked  Protocols used: Rash or Redness -  Localized-A-AH  Chief Complaint: Requesting something stronger be called in for the rash under both of her arms. Symptoms: Itching real bad, draining.  The Mycostatin dried it almost up but now it's back bad as ever.  Frequency: N/A Pertinent Negatives: Patient denies N/A Disposition: [] ED /[] Urgent Care (no appt availability in office) / [] Appointment(In office/virtual)/ []  [place] [facility]/ [] Home Care/ [] Refused Recommended Disposition /[] [place] [facility]/ [x]  Follow-up with PCP Additional Notes: Message being sent to Bertram Denver, NP with pt's request.  Please send to the pharmacy there at the office.

## 2023-04-10 ENCOUNTER — Encounter (HOSPITAL_COMMUNITY): Payer: Self-pay | Admitting: *Deleted

## 2023-04-10 ENCOUNTER — Emergency Department (HOSPITAL_COMMUNITY)
Admission: EM | Admit: 2023-04-10 | Discharge: 2023-04-10 | Disposition: A | Discharge status: Home / Self Care | Payer: Self-pay | Attending: Emergency Medicine | Admitting: Emergency Medicine

## 2023-04-10 ENCOUNTER — Other Ambulatory Visit: Payer: Self-pay

## 2023-04-10 DIAGNOSIS — B372 Candidiasis of skin and nail: Secondary | ICD-10-CM | POA: Insufficient documentation

## 2023-04-10 MED ORDER — CEPHALEXIN 500 MG PO CAPS
500.0000 mg | ORAL_CAPSULE | Freq: Four times a day (QID) | ORAL | 0 refills | Status: AC
Start: 2023-04-10 — End: 2023-04-17

## 2023-04-10 MED ORDER — FLUCONAZOLE 150 MG PO TABS
150.0000 mg | ORAL_TABLET | ORAL | 0 refills | Status: AC
Start: 2023-04-10 — End: 2023-04-14

## 2023-04-10 NOTE — ED Triage Notes (Signed)
The pt has a rash under both her arms for 4 weeks  it is not getting better its getting worse  lmp sept 3rd irregular

## 2023-04-10 NOTE — ED Provider Notes (Signed)
[place] EMERGENCY DEPARTMENT AT [facility] Provider Note   CSN: [id number] Arrival date & time: 04/10/23  2022     History  Chief Complaint  Patient presents with   Rash    Rhonda Avila is a 32 y.o. female.  32 year old female with complaint of rash to bilateral axillary areas onset 4 weeks ago in the right axillary area, suspects she spread the rash to the left side shaving. Patient went to the mobile clinic, has been applying topical antifungal powder and steroid cream, area is less moist but continues to itch. Denies possibility of pregnancy, no other complaints or concerns.        Home Medications Prior to Admission medications   Medication Sig Start Date End Date Taking? Authorizing Provider  cephALEXin (KEFLEX) 500 MG capsule Take 1 capsule (500 mg total) by mouth 4 (four) times daily for 7 days. 04/10/23 04/17/23 Yes Jeannie Fend, PA-C  fluconazole (DIFLUCAN) 150 MG tablet Take 1 tablet (150 mg total) by mouth once a week for 4 days. 04/10/23 04/14/23 Yes Jeannie Fend, PA-C  hydrOXYzine (ATARAX) 10 MG tablet Take 1 tablet (10 mg total) by mouth every 8 (eight) hours as needed. Patient not taking: Reported on 04/10/2022 12/23/21   Claiborne Rigg, NP  meloxicam (MOBIC) 7.5 MG tablet Take 1 tablet (7.5 mg total) by mouth daily.Must have office visit for refills 03/29/23   Hoy Register, MD  metFORMIN (GLUCOPHAGE) 500 MG tablet Take 1 tablet (500 mg total) by mouth 2 (two) times daily with a meal. 10/28/22   Claiborne Rigg, NP  nystatin cream (MYCOSTATIN) Apply 1 Application topically 2 (two) times daily. 03/25/23   Mayers, Cari S, PA-C      Allergies    Patient has no known allergies.    Review of Systems   Review of Systems Negative except as per HPI Physical Exam Updated Vital Signs BP 120/77   Pulse 77   Temp 98.2 F (36.8 C) (Oral)   Resp 18   Ht 5\' 2"  (1.575 m)   Wt 107.7 kg   LMP 02/16/2023   SpO2 98%   BMI 43.43  kg/m  Physical Exam Vitals and nursing note reviewed.  Constitutional:      General: She is not in acute distress.    Appearance: She is well-developed. She is not diaphoretic.  HENT:     Head: Normocephalic and atraumatic.  Pulmonary:     Effort: Pulmonary effort is normal.  Skin:    General: Skin is warm and dry.     Findings: Rash present. No erythema.     Comments: Thickened/hyperpigmented skin to bilateral axillary areas with redness noted in fissures with serous drainage   Neurological:     Mental Status: She is alert and oriented to person, place, and time.  Psychiatric:        Behavior: Behavior normal.     ED Results / Procedures / Treatments   Labs (all labs ordered are listed, but only abnormal results are displayed) Labs Reviewed - No data to display  EKG None  Radiology No results found.  Procedures Procedures    Medications Ordered in ED Medications - No data to display  ED Course/ Medical Decision Making/ A&P                                 Medical Decision Making  32 yo  female with bilateral axillary rash/itching x 1 month. Appears consistent with yeast with possible secondary bacterial infection. Not improving with topical antifungals. Discussed topical care, will start PO abx and Po antifungal with once weekly diflucan and plan to recheck with PCP.         Final Clinical Impression(s) / ED Diagnoses Final diagnoses:  Yeast dermatitis    Rx / DC Orders ED Discharge Orders          Ordered    cephALEXin (KEFLEX) 500 MG capsule  4 times daily        04/10/23 2249    fluconazole (DIFLUCAN) 150 MG tablet  Weekly        04/10/23 2249              Jeannie Fend, PA-C 04/10/23 2316    Gilda Crease, MD 04/11/23 [phone]

## 2023-04-10 NOTE — ED Notes (Signed)
Pt educated to take antibiotics for entire tx regimen. Pt voiced understanding.

## 2023-04-10 NOTE — Discharge Instructions (Signed)
Keep areas dry. Take Diflucan once weekly. Take Keflex as prescribed and complete the full course.  Recheck with your PCP.

## 2023-04-12 ENCOUNTER — Other Ambulatory Visit: Payer: Self-pay

## 2023-04-12 ENCOUNTER — Other Ambulatory Visit: Payer: Self-pay | Admitting: Physician Assistant

## 2023-04-12 ENCOUNTER — Other Ambulatory Visit: Payer: Self-pay | Admitting: Nurse Practitioner

## 2023-04-12 DIAGNOSIS — B372 Candidiasis of skin and nail: Secondary | ICD-10-CM

## 2023-04-13 ENCOUNTER — Other Ambulatory Visit: Payer: Self-pay

## 2023-04-13 MED ORDER — NYSTATIN 100000 UNIT/GM EX CREA
1.0000 | TOPICAL_CREAM | Freq: Two times a day (BID) | CUTANEOUS | 0 refills | Status: DC
Start: 2023-04-13 — End: 2024-02-22
  Filled 2023-04-13: qty 60, 30d supply, fill #0
  Filled 2023-04-14: qty 60, 20d supply, fill #0

## 2023-04-13 NOTE — Telephone Encounter (Signed)
Requested medication (s) are due for refill today: No  Requested medication (s) are on the active medication list: Yes  Last refill:  03/25/23  Future visit scheduled: No  Notes to clinic:  See request.    Requested Prescriptions  Pending Prescriptions Disp Refills   nystatin cream (MYCOSTATIN) 60 g 0    Sig: Apply 1 Application topically 2 (two) times daily.     There is no refill protocol information for this order

## 2023-04-14 ENCOUNTER — Other Ambulatory Visit: Payer: Self-pay

## 2023-04-15 ENCOUNTER — Other Ambulatory Visit: Payer: Self-pay

## 2023-04-21 ENCOUNTER — Other Ambulatory Visit: Payer: Self-pay

## 2023-04-26 ENCOUNTER — Other Ambulatory Visit: Payer: Self-pay

## 2023-04-26 ENCOUNTER — Ambulatory Visit: Payer: Self-pay | Attending: Nurse Practitioner | Admitting: Nurse Practitioner

## 2023-04-26 VITALS — BP 110/73 | HR 70 | Ht 62.0 in | Wt 238.6 lb

## 2023-04-26 DIAGNOSIS — L299 Pruritus, unspecified: Secondary | ICD-10-CM

## 2023-04-26 DIAGNOSIS — B354 Tinea corporis: Secondary | ICD-10-CM

## 2023-04-26 DIAGNOSIS — F320 Major depressive disorder, single episode, mild: Secondary | ICD-10-CM

## 2023-04-26 DIAGNOSIS — F419 Anxiety disorder, unspecified: Secondary | ICD-10-CM

## 2023-04-26 DIAGNOSIS — Z0000 Encounter for general adult medical examination without abnormal findings: Secondary | ICD-10-CM

## 2023-04-26 MED ORDER — BUPROPION HCL ER (XL) 150 MG PO TB24
150.0000 mg | ORAL_TABLET | Freq: Every day | ORAL | 1 refills | Status: DC
Start: 2023-04-26 — End: 2023-05-31
  Filled 2023-04-26: qty 30, 30d supply, fill #0
  Filled 2023-05-31: qty 30, 30d supply, fill #1

## 2023-04-26 MED ORDER — FLUCONAZOLE 150 MG PO TABS
150.0000 mg | ORAL_TABLET | ORAL | 0 refills | Status: AC
Start: 2023-04-26 — End: 2023-06-07
  Filled 2023-04-26: qty 6, 42d supply, fill #0

## 2023-04-26 MED ORDER — INFLUENZA VIRUS VACC SPLIT PF (FLUZONE) 0.5 ML IM SUSY
0.5000 mL | PREFILLED_SYRINGE | Freq: Once | INTRAMUSCULAR | 0 refills | Status: AC
  Filled 2023-04-26: qty 0.5, 1d supply, fill #0

## 2023-04-26 MED ORDER — HYDROXYZINE HCL 10 MG PO TABS
10.0000 mg | ORAL_TABLET | Freq: Three times a day (TID) | ORAL | 0 refills | Status: DC | PRN
  Filled 2023-04-26: qty 60, 20d supply, fill #0

## 2023-04-26 MED ORDER — CLOTRIMAZOLE-BETAMETHASONE 1-0.05 % EX CREA
1.0000 | TOPICAL_CREAM | Freq: Two times a day (BID) | CUTANEOUS | 1 refills | Status: DC
  Filled 2023-04-26: qty 60, 30d supply, fill #0
  Filled 2023-05-31: qty 60, 30d supply, fill #1

## 2023-04-26 NOTE — Progress Notes (Signed)
Assessment & Plan:  Rhonda Avila was seen today for annual exam.  Diagnoses and all orders for this visit:  Annual physical exam -     CBC with Differential/Platelet -     CMP14+EGFR  Current mild episode of major depressive disorder without prior episode (HCC) -     buPROPion (WELLBUTRIN XL) 150 MG 24 hr tablet; Take 1 tablet (150 mg total) by mouth daily.  Anxiety -     hydrOXYzine (ATARAX) 10 MG tablet; Take 1 tablet (10 mg total) by mouth every 8 (eight) hours as needed. For itching  Pruritic condition -     hydrOXYzine (ATARAX) 10 MG tablet; Take 1 tablet (10 mg total) by mouth every 8 (eight) hours as needed. For itching  Tinea corporis -     fluconazole (DIFLUCAN) 150 MG tablet; Take 1 tablet (150 mg total) by mouth once a week for 6 doses. -     clotrimazole-betamethasone (LOTRISONE) cream; Apply 1 Application topically 2 (two) times daily. Apply to underarms    Patient has been counseled on age-appropriate routine health concerns for screening and prevention. These are reviewed and up-to-date. Referrals have been placed accordingly. Immunizations are up-to-date or declined.    Subjective:   Chief Complaint  Patient presents with   Annual Exam    Rhonda Avila 32 y.o. female presents to office today for annual physical  She has been experiencing increased stress and depression due to finances and concerns about her brother's state of health. Very tearful today. Symptoms of depression include: isolation, irritability, tearfulness.  Tinea: Patient complains of probable tinea. Lesions are located on the  bilateral axilla . Symptoms include areas of hypopigmentation, erythema.   Symptoms have been ongoing for about several weeks. Previous evaluation and treatment has included prescription topical antifungal per med orders: somewhat effective oral antifungal per med orders: somewhat effective with fair improvement.  Patient denies alcohol overuse, fever,  hematemesis, and melena.    Review of Systems  Constitutional:  Negative for fever, malaise/fatigue and weight loss.  HENT: Negative.  Negative for nosebleeds.   Eyes: Negative.  Negative for blurred vision, double vision and photophobia.  Respiratory: Negative.  Negative for cough and shortness of breath.   Cardiovascular: Negative.  Negative for chest pain, palpitations and leg swelling.  Gastrointestinal: Negative.  Negative for heartburn, nausea and vomiting.  Genitourinary: Negative.   Musculoskeletal: Negative.  Negative for myalgias.  Skin:  Positive for itching and rash.  Neurological: Negative.  Negative for dizziness, focal weakness, seizures and headaches.  Endo/Heme/Allergies: Negative.   Psychiatric/Behavioral:  Positive for depression. Negative for suicidal ideas.     Past Medical History:  Diagnosis Date   HSV (herpes simplex virus) infection    Pregnancy induced hypertension     Past Surgical History:  Procedure Laterality Date   NO PAST SURGERIES      Family History  Problem Relation Age of Onset   Hypertension Mother    Diabetes Mother    Arthritis Mother    Hypertension Maternal Grandmother    Hypertension Sister     Social History Reviewed with no changes to be made today.   Outpatient Medications Prior to Visit  Medication Sig Dispense Refill   meloxicam (MOBIC) 7.5 MG tablet Take 1 tablet (7.5 mg total) by mouth daily.Must have office visit for refills 30 tablet 0   metFORMIN (GLUCOPHAGE) 500 MG tablet Take 1 tablet (500 mg total) by mouth 2 (two) times daily with a meal. 180  tablet 3   nystatin cream (MYCOSTATIN) Apply 1 Application topically 2 (two) times daily. 60 g 0   hydrOXYzine (ATARAX) 10 MG tablet Take 1 tablet (10 mg total) by mouth every 8 (eight) hours as needed. (Patient not taking: Reported on 04/10/2022) 60 tablet 0   No facility-administered medications prior to visit.    No Known Allergies     Objective:    BP 110/73 (BP  Location: Left Arm, Patient Position: Sitting, Cuff Size: Large)   Pulse 70   Ht 5\' 2"  (1.575 m)   Wt 238 lb 9.6 oz (108.2 kg)   LMP 02/16/2023 (Exact Date)   SpO2 97%   BMI 43.64 kg/m  Wt Readings from Last 3 Encounters:  04/26/23 238 lb 9.6 oz (108.2 kg)  04/10/23 237 lb 7 oz (107.7 kg)  03/29/23 237 lb 8 oz (107.7 kg)    Physical Exam Constitutional:      Appearance: She is well-developed.  HENT:     Head: Normocephalic and atraumatic.     Right Ear: Hearing, tympanic membrane, ear canal and external ear normal.     Left Ear: Hearing, tympanic membrane, ear canal and external ear normal.     Nose: Nose normal.     Right Turbinates: Not enlarged.     Left Turbinates: Not enlarged.     Mouth/Throat:     Lips: Pink.     Mouth: Mucous membranes are moist.     Dentition: No dental tenderness, gingival swelling, dental abscesses or gum lesions.     Pharynx: No oropharyngeal exudate.  Eyes:     General: No scleral icterus.       Right eye: No discharge.     Extraocular Movements: Extraocular movements intact.     Conjunctiva/sclera: Conjunctivae normal.     Pupils: Pupils are equal, round, and reactive to light.  Neck:     Thyroid: No thyromegaly.     Trachea: No tracheal deviation.  Cardiovascular:     Rate and Rhythm: Normal rate and regular rhythm.     Heart sounds: Normal heart sounds. No murmur heard.    No friction rub.  Pulmonary:     Effort: Pulmonary effort is normal. No accessory muscle usage or respiratory distress.     Breath sounds: Normal breath sounds. No decreased breath sounds, wheezing, rhonchi or rales.  Abdominal:     General: Bowel sounds are normal. There is no distension.     Palpations: Abdomen is soft. There is no mass.     Tenderness: There is no abdominal tenderness. There is no right CVA tenderness, left CVA tenderness, guarding or rebound.     Hernia: No hernia is present.  Musculoskeletal:        General: No tenderness or deformity. Normal  range of motion.     Cervical back: Normal range of motion and neck supple.  Lymphadenopathy:     Cervical: No cervical adenopathy.  Skin:    General: Skin is warm and dry.     Findings: No erythema.  Neurological:     Mental Status: She is alert and oriented to person, place, and time.     Cranial Nerves: No cranial nerve deficit.     Motor: Motor function is intact.     Coordination: Coordination is intact. Coordination normal.     Gait: Gait is intact.     Deep Tendon Reflexes:     Reflex Scores:      Patellar reflexes are 1+ on the  right side and 1+ on the left side. Psychiatric:        Attention and Perception: Attention normal.        Mood and Affect: Mood is depressed. Affect is tearful.        Speech: Speech normal.        Behavior: Behavior normal.        Thought Content: Thought content normal.        Judgment: Judgment normal.          Patient has been counseled extensively about nutrition and exercise as well as the importance of adherence with medications and regular follow-up. The patient was given clear instructions to go to ER or return to medical center if symptoms don't improve, worsen or new problems develop. The patient verbalized understanding.   Follow-up: Return in about 4 weeks (around 05/24/2023) for DM.   Claiborne Rigg, FNP-BC [facility] [place], [place] [phone]   04/26/2023, 9:19 PM

## 2023-04-27 ENCOUNTER — Other Ambulatory Visit: Payer: Self-pay

## 2023-04-27 LAB — CBC WITH DIFFERENTIAL/PLATELET
Basophils Absolute: 0 10*3/uL (ref 0.0–0.2)
Basos: 1 %
EOS (ABSOLUTE): 0.1 10*3/uL (ref 0.0–0.4)
Eos: 1 %
Hematocrit: 39.4 % (ref 34.0–46.6)
Hemoglobin: 13.3 g/dL (ref 11.1–15.9)
Immature Grans (Abs): 0 10*3/uL (ref 0.0–0.1)
Immature Granulocytes: 1 %
Lymphocytes Absolute: 3 10*3/uL (ref 0.7–3.1)
Lymphs: 35 %
MCH: 30.2 pg (ref 26.6–33.0)
MCHC: 33.8 g/dL (ref 31.5–35.7)
MCV: 90 fL (ref 79–97)
Monocytes Absolute: 0.6 10*3/uL (ref 0.1–0.9)
Monocytes: 6 %
Neutrophils Absolute: 5 10*3/uL (ref 1.4–7.0)
Neutrophils: 56 %
Platelets: 283 10*3/uL (ref 150–450)
RBC: 4.4 x10E6/uL (ref 3.77–5.28)
RDW: 12.6 % (ref 11.7–15.4)
WBC: 8.8 10*3/uL (ref 3.4–10.8)

## 2023-04-27 LAB — CMP14+EGFR
ALT: 22 [IU]/L (ref 0–32)
AST: 19 [IU]/L (ref 0–40)
Albumin: 4.4 g/dL (ref 3.9–4.9)
Alkaline Phosphatase: 110 [IU]/L (ref 44–121)
BUN/Creatinine Ratio: 15 (ref 9–23)
BUN: 12 mg/dL (ref 6–20)
Bilirubin Total: 0.3 mg/dL (ref 0.0–1.2)
CO2: 25 mmol/L (ref 20–29)
Calcium: 9.6 mg/dL (ref 8.7–10.2)
Chloride: 102 mmol/L (ref 96–106)
Creatinine, Ser: 0.82 mg/dL (ref 0.57–1.00)
Globulin, Total: 2.9 g/dL (ref 1.5–4.5)
Glucose: 82 mg/dL (ref 70–99)
Potassium: 4.4 mmol/L (ref 3.5–5.2)
Sodium: 140 mmol/L (ref 134–144)
Total Protein: 7.3 g/dL (ref 6.0–8.5)
eGFR: 98 mL/min/{1.73_m2} (ref >59)

## 2023-05-31 ENCOUNTER — Other Ambulatory Visit: Payer: Self-pay

## 2023-05-31 ENCOUNTER — Ambulatory Visit: Payer: Self-pay | Attending: Nurse Practitioner | Admitting: Nurse Practitioner

## 2023-05-31 ENCOUNTER — Encounter: Payer: Self-pay | Admitting: Nurse Practitioner

## 2023-05-31 DIAGNOSIS — F320 Major depressive disorder, single episode, mild: Secondary | ICD-10-CM

## 2023-05-31 DIAGNOSIS — F419 Anxiety disorder, unspecified: Secondary | ICD-10-CM

## 2023-05-31 DIAGNOSIS — B354 Tinea corporis: Secondary | ICD-10-CM

## 2023-05-31 DIAGNOSIS — L299 Pruritus, unspecified: Secondary | ICD-10-CM

## 2023-05-31 MED ORDER — BUPROPION HCL ER (XL) 150 MG PO TB24
150.0000 mg | ORAL_TABLET | Freq: Every day | ORAL | 1 refills | Status: DC
  Filled 2023-06-30: qty 30, 30d supply, fill #0
  Filled 2023-07-26: qty 30, 30d supply, fill #1

## 2023-05-31 MED ORDER — CLOTRIMAZOLE-BETAMETHASONE 1-0.05 % EX CREA
1.0000 | TOPICAL_CREAM | Freq: Two times a day (BID) | CUTANEOUS | 1 refills | Status: DC
  Filled 2023-06-30: qty 45, 23d supply, fill #0
  Filled 2023-07-26: qty 45, 23d supply, fill #1

## 2023-05-31 MED ORDER — HYDROXYZINE HCL 10 MG PO TABS
10.0000 mg | ORAL_TABLET | Freq: Three times a day (TID) | ORAL | 0 refills | Status: DC | PRN
  Filled 2023-05-31: qty 60, 20d supply, fill #0

## 2023-05-31 NOTE — Progress Notes (Signed)
Assessment & Plan:  Rhonda Avila was seen today for depression.  Diagnoses and all orders for this visit:  Current mild episode of major depressive disorder without prior episode continue mood stabilizer as prescribed -     buPROPion (WELLBUTRIN XL) 150 MG 24 hr tablet; Take 1 tablet (150 mg total) by mouth daily.  Tinea corporis -     clotrimazole-betamethasone (LOTRISONE) cream; Apply 1 Application topically 2 (two) times daily. Apply to underarms  Anxiety Well-controlled with infrequent use of hydroxyzine. -     hydrOXYzine (ATARAX) 10 MG tablet; Take 1 tablet (10 mg total) by mouth every 8 (eight) hours as needed. For itching  Pruritic condition -     hydrOXYzine (ATARAX) 10 MG tablet; Take 1 tablet (10 mg total) by mouth every 8 (eight) hours as needed. For itching    Patient has been counseled on age-appropriate routine health concerns for screening and prevention. These are reviewed and up-to-date. Referrals have been placed accordingly. Immunizations are up-to-date or declined.    Subjective:   Chief Complaint  Patient presents with   Depression    Rhonda Avila 32 y.o. female presents to office today for follow up to depression  Rhonda Avila was started on bupropion at her most recent visit with me on April 26, 2023.  At that time she endorsed increased stress and depression due to finances and concerns about her brother's state of health.  Today she states she is feeling much better and feels more peaceful after starting Wellbutrin.  Despite PHQ-9 score being increased she does feel the bupropion is helping with her mood lability    05/31/2023   11:52 AM 04/26/2023    2:48 PM 03/29/2023   10:33 AM  Depression screen PHQ 2/9  Decreased Interest 3 0 2  Down, Depressed, Hopeless 2 0 1  PHQ - 2 Score 5 0 3  Altered sleeping 3  3  Tired, decreased energy 3  3  Change in appetite 3  3  Feeling bad or failure about yourself  0  0  Trouble  concentrating 1  0  Moving slowly or fidgety/restless 3  3  Suicidal thoughts 0  0  PHQ-9 Score 18  15  Difficult doing work/chores Somewhat difficult      Review of Systems  Constitutional:  Negative for fever, malaise/fatigue and weight loss.  HENT: Negative.  Negative for nosebleeds.   Eyes: Negative.  Negative for blurred vision, double vision and photophobia.  Respiratory: Negative.  Negative for cough and shortness of breath.   Cardiovascular: Negative.  Negative for chest pain, palpitations and leg swelling.  Gastrointestinal: Negative.  Negative for heartburn, nausea and vomiting.  Musculoskeletal: Negative.  Negative for myalgias.  Neurological: Negative.  Negative for dizziness, focal weakness, seizures and headaches.  Psychiatric/Behavioral:  Positive for depression. Negative for suicidal ideas. The patient is nervous/anxious.     Past Medical History:  Diagnosis Date   HSV (herpes simplex virus) infection    Pregnancy induced hypertension     Past Surgical History:  Procedure Laterality Date   NO PAST SURGERIES      Family History  Problem Relation Age of Onset   Hypertension Mother    Diabetes Mother    Arthritis Mother    Hypertension Maternal Grandmother    Hypertension Sister     Social History Reviewed with no changes to be made today.   Outpatient Medications Prior to Visit  Medication Sig Dispense Refill   fluconazole (DIFLUCAN)  150 MG tablet Take 1 tablet (150 mg total) by mouth once a week for 6 doses. 6 tablet 0   meloxicam (MOBIC) 7.5 MG tablet Take 1 tablet (7.5 mg total) by mouth daily.Must have office visit for refills 30 tablet 0   metFORMIN (GLUCOPHAGE) 500 MG tablet Take 1 tablet (500 mg total) by mouth 2 (two) times daily with a meal. 180 tablet 3   nystatin cream (MYCOSTATIN) Apply 1 Application topically 2 (two) times daily. 60 g 0   buPROPion (WELLBUTRIN XL) 150 MG 24 hr tablet Take 1 tablet (150 mg total) by mouth daily. 30 tablet 1    clotrimazole-betamethasone (LOTRISONE) cream Apply 1 Application topically 2 (two) times daily. Apply to underarms 60 g 1   hydrOXYzine (ATARAX) 10 MG tablet Take 1 tablet (10 mg total) by mouth every 8 (eight) hours as needed. For itching 60 tablet 0   No facility-administered medications prior to visit.    No Known Allergies     Objective:    BP 119/78 (BP Location: Left Arm, Patient Position: Sitting, Cuff Size: Large)   Pulse 74   Temp 97.6 F (36.4 C) (Oral)   Resp 20   Ht 5\' 2"  (1.575 m)   Wt 241 lb 9.6 oz (109.6 kg)   SpO2 99%   BMI 44.19 kg/m  Wt Readings from Last 3 Encounters:  05/31/23 241 lb 9.6 oz (109.6 kg)  04/26/23 238 lb 9.6 oz (108.2 kg)  04/10/23 237 lb 7 oz (107.7 kg)    Physical Exam Vitals and nursing note reviewed.  Constitutional:      Appearance: She is well-developed.  HENT:     Head: Normocephalic and atraumatic.  Cardiovascular:     Rate and Rhythm: Normal rate and regular rhythm.     Heart sounds: Normal heart sounds. No murmur heard.    No friction rub. No gallop.  Pulmonary:     Effort: Pulmonary effort is normal. No tachypnea or respiratory distress.     Breath sounds: Normal breath sounds. No decreased breath sounds, wheezing, rhonchi or rales.  Chest:     Chest wall: No tenderness.  Abdominal:     General: Bowel sounds are normal.     Palpations: Abdomen is soft.  Musculoskeletal:        General: Normal range of motion.     Cervical back: Normal range of motion.  Skin:    General: Skin is warm and dry.  Neurological:     Mental Status: She is alert and oriented to person, place, and time.     Coordination: Coordination normal.  Psychiatric:        Behavior: Behavior normal. Behavior is cooperative.        Thought Content: Thought content normal.        Judgment: Judgment normal.          Patient has been counseled extensively about nutrition and exercise as well as the importance of adherence with medications and regular  follow-up. The patient was given clear instructions to go to ER or return to medical center if symptoms don't improve, worsen or new problems develop. The patient verbalized understanding.   Follow-up: Return in about 4 months (around 09/29/2023).   Claiborne Rigg, FNP-BC [facility] [place], [place] [phone]   05/31/2023, 1:28 PM

## 2023-06-30 ENCOUNTER — Other Ambulatory Visit: Payer: Self-pay

## 2023-06-30 ENCOUNTER — Other Ambulatory Visit: Payer: Self-pay | Admitting: Nurse Practitioner

## 2023-06-30 ENCOUNTER — Other Ambulatory Visit: Payer: Self-pay | Admitting: Family Medicine

## 2023-06-30 DIAGNOSIS — F419 Anxiety disorder, unspecified: Secondary | ICD-10-CM

## 2023-06-30 DIAGNOSIS — L299 Pruritus, unspecified: Secondary | ICD-10-CM

## 2023-06-30 DIAGNOSIS — G8929 Other chronic pain: Secondary | ICD-10-CM

## 2023-06-30 MED ORDER — HYDROXYZINE HCL 10 MG PO TABS
10.0000 mg | ORAL_TABLET | Freq: Three times a day (TID) | ORAL | 0 refills | Status: DC | PRN
  Filled 2023-06-30: qty 270, 90d supply, fill #0

## 2023-06-30 MED ORDER — MELOXICAM 7.5 MG PO TABS
7.5000 mg | ORAL_TABLET | Freq: Every day | ORAL | 1 refills | Status: DC
  Filled 2023-06-30: qty 30, 30d supply, fill #0
  Filled 2023-10-26 – 2023-11-10 (×2): qty 30, 30d supply, fill #1

## 2023-07-01 ENCOUNTER — Other Ambulatory Visit: Payer: Self-pay

## 2023-07-26 ENCOUNTER — Other Ambulatory Visit: Payer: Self-pay

## 2023-07-28 ENCOUNTER — Other Ambulatory Visit: Payer: Self-pay

## 2023-08-26 ENCOUNTER — Encounter (HOSPITAL_COMMUNITY): Payer: Self-pay

## 2023-08-26 ENCOUNTER — Other Ambulatory Visit: Payer: Self-pay

## 2023-08-26 ENCOUNTER — Emergency Department (HOSPITAL_COMMUNITY)
Admission: EM | Admit: 2023-08-26 | Discharge: 2023-08-27 | Disposition: A | Discharge status: Home / Self Care | Payer: Self-pay | Attending: Emergency Medicine | Admitting: Emergency Medicine

## 2023-08-26 DIAGNOSIS — M25511 Pain in right shoulder: Secondary | ICD-10-CM | POA: Insufficient documentation

## 2023-08-26 DIAGNOSIS — M79601 Pain in right arm: Secondary | ICD-10-CM

## 2023-08-26 NOTE — ED Triage Notes (Addendum)
 Pt reports tingling pain that started on the right side of her neck that radiates down her entire right arm, into her back and now has radiated across her upper abdomen, onset tonight around 2300. Denies any other symptoms. Pt is tearful at triage. Denies injury.

## 2023-08-27 ENCOUNTER — Other Ambulatory Visit (HOSPITAL_COMMUNITY): Payer: Self-pay

## 2023-08-27 ENCOUNTER — Other Ambulatory Visit: Payer: Self-pay

## 2023-08-27 ENCOUNTER — Emergency Department (HOSPITAL_COMMUNITY): Payer: Self-pay

## 2023-08-27 LAB — COMPREHENSIVE METABOLIC PANEL
ALT: 22 U/L (ref 0–44)
AST: 24 U/L (ref 15–41)
Albumin: 3.6 g/dL (ref 3.5–5.0)
Alkaline Phosphatase: 67 U/L (ref 38–126)
Anion gap: 12 (ref 5–15)
BUN: 13 mg/dL (ref 6–20)
CO2: 22 mmol/L (ref 22–32)
Calcium: 8.9 mg/dL (ref 8.9–10.3)
Chloride: 105 mmol/L (ref 98–111)
Creatinine, Ser: 0.63 mg/dL (ref 0.44–1.00)
GFR, Estimated: >60 mL/min (ref >60)
Glucose, Bld: 104 mg/dL — ABNORMAL HIGH (ref 70–99)
Potassium: 3.7 mmol/L (ref 3.5–5.1)
Sodium: 139 mmol/L (ref 135–145)
Total Bilirubin: 0.5 mg/dL (ref 0.0–1.2)
Total Protein: 7 g/dL (ref 6.5–8.1)

## 2023-08-27 LAB — CBC
HCT: 40 % (ref 36.0–46.0)
Hemoglobin: 13 g/dL (ref 12.0–15.0)
MCH: 29.3 pg (ref 26.0–34.0)
MCHC: 32.5 g/dL (ref 30.0–36.0)
MCV: 90.1 fL (ref 80.0–100.0)
Platelets: 229 10*3/uL (ref 150–400)
RBC: 4.44 MIL/uL (ref 3.87–5.11)
RDW: 13.3 % (ref 11.5–15.5)
WBC: 10.1 10*3/uL (ref 4.0–10.5)
nRBC: 0 % (ref 0.0–0.2)

## 2023-08-27 LAB — TROPONIN I (HIGH SENSITIVITY)
Troponin I (High Sensitivity): 3 ng/L (ref <18)
Troponin I (High Sensitivity): <2 ng/L (ref <18)

## 2023-08-27 LAB — LIPASE, BLOOD: Lipase: 29 U/L (ref 11–51)

## 2023-08-27 LAB — HCG, SERUM, QUALITATIVE: Preg, Serum: NEGATIVE

## 2023-08-27 MED ORDER — LIDOCAINE 5 % EX PTCH
1.0000 | MEDICATED_PATCH | CUTANEOUS | 0 refills | Status: DC
  Filled 2023-08-27: qty 10, 10d supply, fill #0

## 2023-08-27 MED ORDER — LIDOCAINE 5 % EX PTCH
1.0000 | MEDICATED_PATCH | Freq: Once | CUTANEOUS | Status: DC
Start: 2023-08-27 — End: 2023-08-27
  Administered 2023-08-27: 1 via TRANSDERMAL
  Filled 2023-08-27: qty 1

## 2023-08-27 MED ORDER — METHOCARBAMOL 500 MG PO TABS
500.0000 mg | ORAL_TABLET | Freq: Once | ORAL | Status: AC
  Administered 2023-08-27: 500 mg via ORAL
  Filled 2023-08-27: qty 1

## 2023-08-27 MED ORDER — METHOCARBAMOL 500 MG PO TABS
500.0000 mg | ORAL_TABLET | Freq: Two times a day (BID) | ORAL | 0 refills | Status: DC
  Filled 2023-08-27: qty 20, 10d supply, fill #0

## 2023-08-27 NOTE — Discharge Instructions (Signed)
 Take the prescribed medication as directed.  Can try using heating pad/warm compresses on the area as well. Follow-up with your primary care doctor. Return to the ED for new or worsening symptoms.

## 2023-08-27 NOTE — ED Provider Notes (Signed)
 [place] EMERGENCY DEPARTMENT AT [facility] Provider Note   CSN: [id number] Arrival date & time: 08/26/23  2334     History  Chief Complaint  Patient presents with   Arm Pain    Rhonda Avila is a 33 y.o. female.  The history is provided by the patient and medical records.  Arm Pain   33 year old female presenting to the ED with right arm pain.  Patient states she was lying in bed and around 11 PM she started feeling a sharp, pinching type sensation in her right posterior shoulder.  Thought maybe she was sleeping awkwardly so she got up and tried to move around but felt it radiating down into the arm then proceeded down into the abdomen.  She does not have any associated nausea or vomiting.  Husband tried to massage the area but was not really helping.  She did not try taking any oral medicines.  States symptoms have improved since onset, however still feels uncomfortable in her right posterior shoulder.  She is right-hand dominant.  Denies any new injury, trauma, fall, or heavy lifting recently.  Home Medications Prior to Admission medications   Medication Sig Start Date End Date Taking? Authorizing Provider  buPROPion (WELLBUTRIN XL) 150 MG 24 hr tablet Take 1 tablet (150 mg total) by mouth daily. 05/31/23   Claiborne Rigg, NP  clotrimazole-betamethasone (LOTRISONE) cream Apply 1 Application topically 2 (two) times daily. Apply to underarms 05/31/23   Claiborne Rigg, NP  hydrOXYzine (ATARAX) 10 MG tablet Take 1 tablet (10 mg total) by mouth every 8 (eight) hours as needed. For itching 06/30/23   Hoy Register, MD  meloxicam (MOBIC) 7.5 MG tablet Take 1 tablet (7.5 mg total) by mouth daily. 06/30/23   Hoy Register, MD  metFORMIN (GLUCOPHAGE) 500 MG tablet Take 1 tablet (500 mg total) by mouth 2 (two) times daily with a meal. 10/28/22   Claiborne Rigg, NP  nystatin cream (MYCOSTATIN) Apply 1 Application topically 2 (two) times daily. 04/13/23    Mayers, Cari S, PA-C      Allergies    Patient has no known allergies.    Review of Systems   Review of Systems  Musculoskeletal:  Positive for arthralgias.  All other systems reviewed and are negative.   Physical Exam Updated Vital Signs BP 119/68   Pulse 75   Temp (!) 97.5 F (36.4 C) (Oral)   Resp 18   Ht 5\' 2"  (1.575 m)   Wt 102.5 kg   LMP 07/19/2023 (Approximate)   SpO2 99%   BMI 41.34 kg/m   Physical Exam Vitals and nursing note reviewed.  Constitutional:      Appearance: She is well-developed.     Comments: Sleeping on stretcher, awoken for exam  HENT:     Head: Normocephalic and atraumatic.  Eyes:     Conjunctiva/sclera: Conjunctivae normal.     Pupils: Pupils are equal, round, and reactive to light.  Cardiovascular:     Rate and Rhythm: Normal rate and regular rhythm.     Heart sounds: Normal heart sounds.  Pulmonary:     Effort: Pulmonary effort is normal.     Breath sounds: Normal breath sounds.  Abdominal:     General: Bowel sounds are normal.     Palpations: Abdomen is soft.     Tenderness: There is no abdominal tenderness. There is no rebound.     Comments: Soft, non-tender  Musculoskeletal:  General: Normal range of motion.     Cervical back: Normal range of motion.     Comments: Point tender along right trap just above mid-scapula; no surrounding erythema/induration, no rashes Normal ROM of right shoulder, elbow, wrist, hand, and fingers, normal grip strength, no focal deficit  Skin:    General: Skin is warm and dry.  Neurological:     Mental Status: She is alert and oriented to person, place, and time.     ED Results / Procedures / Treatments   Labs (all labs ordered are listed, but only abnormal results are displayed) Labs Reviewed  COMPREHENSIVE METABOLIC PANEL - Abnormal; Notable for the following components:      Result Value   Glucose, Bld 104 (*)    All other components within normal limits  CBC  HCG, SERUM, QUALITATIVE   LIPASE, BLOOD  TROPONIN I (HIGH SENSITIVITY)  TROPONIN I (HIGH SENSITIVITY)    EKG EKG Interpretation Date/Time:  Thursday August 26 2023 23:50:24 EDT Ventricular Rate:  92 PR Interval:  142 QRS Duration:  92 QT Interval:  344 QTC Calculation: 425 R Axis:   43  Text Interpretation: Normal sinus rhythm Normal ECG No previous ECGs available Confirmed by Zadie Rhine ([id number]) on 08/27/2023 3:25:16 AM  Radiology DG Chest 2 View Result Date: 08/27/2023 CLINICAL DATA:  Chest pain. EXAM: CHEST - 2 VIEW COMPARISON:  None Available. FINDINGS: The heart size and mediastinal contours are within normal limits. Both lungs are clear. The visualized skeletal structures are unremarkable. IMPRESSION: No active cardiopulmonary disease. Electronically Signed   By: Aram Candela M.D.   On: 08/27/2023 01:48    Procedures Procedures    Medications Ordered in ED Medications  lidocaine (LIDODERM) 5 % 1 patch (1 patch Transdermal Patch Applied 08/27/23 0351)  methocarbamol (ROBAXIN) tablet 500 mg (500 mg Oral Given 08/27/23 0351)    ED Course/ Medical Decision Making/ A&P                                 Medical Decision Making Amount and/or Complexity of Data Reviewed Labs: ordered. Radiology: ordered and independent interpretation performed. ECG/medicine tests: ordered and independent interpretation performed.  Risk Prescription drug management.   33 year old female presenting to the ED with pain in posterior right shoulder.  Began while she was sleeping, thought she may have slept wrong.  Had some pain radiating down the arm and somewhat into the abdomen.  No nausea or vomiting associated.  Pain seems to have improved since time of arrival.  She was actually sleeping and was awoken for exam.  She has reproducible tenderness to her right trapezius just above the mid scapular region.  There is no erythema or induration, no rashes or other overlying skin changes.  She maintains full range  of motion of the right arm and has normal grip strength and sensation throughout.  She is not have any focal neurologic deficits.  Her abdomen is soft and nontender.  EKG is nonischemic.  She has no leukocytosis or electrolyte derangement.  Normal lipase.  Normal troponin x 2.  Chest x-ray is clear.  Suspect she likely has some muscular spasm.  She is right hand dominant.  She was given muscle relaxer here and Lidoderm patch.  As she remains without any new focal neurologic deficit I do not feel she needs advanced imaging such as CT or MRI at this time.  Plan for symptomatic care and close  PCP follow-up.  Can return here for new concerns.  Final Clinical Impression(s) / ED Diagnoses Final diagnoses:  Right arm pain    Rx / DC Orders ED Discharge Orders          Ordered    lidocaine (LIDODERM) 5 %  Every 24 hours        08/27/23 0437    methocarbamol (ROBAXIN) 500 MG tablet  2 times daily        08/27/23 0437              Garlon Hatchet, PA-C 08/27/23 1610    Zadie Rhine, MD 08/27/23 [phone]

## 2023-09-22 ENCOUNTER — Other Ambulatory Visit: Payer: Self-pay

## 2023-09-22 ENCOUNTER — Emergency Department (HOSPITAL_COMMUNITY)
Admission: EM | Admit: 2023-09-22 | Discharge: 2023-09-23 | Disposition: A | Discharge status: Home / Self Care | Payer: Self-pay | Attending: Emergency Medicine | Admitting: Emergency Medicine

## 2023-09-22 ENCOUNTER — Encounter (HOSPITAL_COMMUNITY): Payer: Self-pay | Admitting: *Deleted

## 2023-09-22 DIAGNOSIS — N939 Abnormal uterine and vaginal bleeding, unspecified: Secondary | ICD-10-CM | POA: Insufficient documentation

## 2023-09-22 DIAGNOSIS — R1030 Lower abdominal pain, unspecified: Secondary | ICD-10-CM | POA: Insufficient documentation

## 2023-09-22 LAB — COMPREHENSIVE METABOLIC PANEL WITH GFR
ALT: 17 U/L (ref 0–44)
AST: 14 U/L — ABNORMAL LOW (ref 15–41)
Albumin: 3.7 g/dL (ref 3.5–5.0)
Alkaline Phosphatase: 60 U/L (ref 38–126)
Anion gap: 10 (ref 5–15)
BUN: 13 mg/dL (ref 6–20)
CO2: 25 mmol/L (ref 22–32)
Calcium: 9.1 mg/dL (ref 8.9–10.3)
Chloride: 103 mmol/L (ref 98–111)
Creatinine, Ser: 0.71 mg/dL (ref 0.44–1.00)
GFR, Estimated: >60 mL/min (ref >60)
Glucose, Bld: 92 mg/dL (ref 70–99)
Potassium: 3.8 mmol/L (ref 3.5–5.1)
Sodium: 138 mmol/L (ref 135–145)
Total Bilirubin: 0.5 mg/dL (ref 0.0–1.2)
Total Protein: 7.3 g/dL (ref 6.5–8.1)

## 2023-09-22 LAB — CBC
HCT: 39.1 % (ref 36.0–46.0)
Hemoglobin: 12.8 g/dL (ref 12.0–15.0)
MCH: 29.4 pg (ref 26.0–34.0)
MCHC: 32.7 g/dL (ref 30.0–36.0)
MCV: 89.9 fL (ref 80.0–100.0)
Platelets: 246 10*3/uL (ref 150–400)
RBC: 4.35 MIL/uL (ref 3.87–5.11)
RDW: 13.5 % (ref 11.5–15.5)
WBC: 8.7 10*3/uL (ref 4.0–10.5)
nRBC: 0 % (ref 0.0–0.2)

## 2023-09-22 LAB — URINALYSIS, ROUTINE W REFLEX MICROSCOPIC
Bacteria, UA: NONE SEEN
Bilirubin Urine: NEGATIVE
Glucose, UA: NEGATIVE mg/dL
Ketones, ur: NEGATIVE mg/dL
Nitrite: NEGATIVE
Protein, ur: 100 mg/dL — AB
RBC / HPF: >50 RBC/hpf (ref 0–5)
Specific Gravity, Urine: 1.023 (ref 1.005–1.030)
pH: 6 (ref 5.0–8.0)

## 2023-09-22 LAB — HCG, SERUM, QUALITATIVE: Preg, Serum: NEGATIVE

## 2023-09-22 LAB — SAMPLE TO BLOOD BANK

## 2023-09-22 NOTE — ED Triage Notes (Signed)
 The pts period started yesterday and her flow is extremely heavy with blood clots heavy today  no known   feb 3rd that lasted 20 days with intermittent spotting lmp feb 3rd

## 2023-09-23 ENCOUNTER — Other Ambulatory Visit: Payer: Self-pay

## 2023-09-23 MED ORDER — IBUPROFEN 800 MG PO TABS
800.0000 mg | ORAL_TABLET | Freq: Once | ORAL | Status: AC
  Administered 2023-09-23: 800 mg via ORAL
  Filled 2023-09-23: qty 1

## 2023-09-23 MED ORDER — TRANEXAMIC ACID 650 MG PO TABS
1300.0000 mg | ORAL_TABLET | ORAL | Status: AC
  Administered 2023-09-23: 1300 mg via ORAL
  Filled 2023-09-23: qty 2

## 2023-09-23 MED ORDER — IBUPROFEN 800 MG PO TABS
800.0000 mg | ORAL_TABLET | Freq: Three times a day (TID) | ORAL | 0 refills | Status: DC
  Filled 2023-09-23: qty 21, 7d supply, fill #0

## 2023-09-23 MED ORDER — TRANEXAMIC ACID 650 MG PO TABS
1300.0000 mg | ORAL_TABLET | Freq: Three times a day (TID) | ORAL | 0 refills | Status: DC
  Filled 2023-09-23: qty 30, 5d supply, fill #0

## 2023-09-23 MED ORDER — KETOROLAC TROMETHAMINE 15 MG/ML IJ SOLN: 15.0000 mg | Freq: Once | INTRAMUSCULAR | Status: DC

## 2023-09-23 NOTE — ED Provider Notes (Signed)
 [place] EMERGENCY DEPARTMENT AT [facility] Provider Note   CSN: [id number] Arrival date & time: 09/22/23  2143     History  Chief Complaint  Patient presents with   Vaginal Bleeding    Rhonda Avila is a 33 y.o. female with medical history to include HSV.  Patient presents to ED for evaluation of heavy vaginal bleeding.  States that she began her menstrual cycle yesterday.  States that she has had lower abdominal cramping consistent with menstrual cycles, heavy vaginal bleeding.  Reports she is passing clots.  She reports that she is changing her menstrual pad every hour on the hour.  She denies lightheadedness, dizziness, weakness, shortness of breath.  She denies any kind of birth control.  She reports she is G1, P1.  She denies any dysuria, nausea, vomiting, fevers.  Denies a history of abnormal uterine bleeding.  Denies history of blood transfusions.   Vaginal Bleeding      Home Medications Prior to Admission medications   Medication Sig Start Date End Date Taking? Authorizing Provider  ibuprofen (ADVIL) 800 MG tablet Take 1 tablet (800 mg total) by mouth 3 (three) times daily. 09/23/23  Yes Al Decant, PA-C  tranexamic acid (LYSTEDA) 650 MG TABS tablet Take 2 tablets (1,300 mg total) by mouth 3 (three) times daily. 09/23/23  Yes Al Decant, PA-C  buPROPion (WELLBUTRIN XL) 150 MG 24 hr tablet Take 1 tablet (150 mg total) by mouth daily. 05/31/23   Claiborne Rigg, NP  clotrimazole-betamethasone (LOTRISONE) cream Apply 1 Application topically 2 (two) times daily. Apply to underarms 05/31/23   Claiborne Rigg, NP  hydrOXYzine (ATARAX) 10 MG tablet Take 1 tablet (10 mg total) by mouth every 8 (eight) hours as needed. For itching 06/30/23   Hoy Register, MD  lidocaine (LIDODERM) 5 % Place 1 patch onto the skin daily. Remove and discard patch within 12 hours or as directed by MD. 08/27/23   Garlon Hatchet, PA-C  meloxicam (MOBIC) 7.5  MG tablet Take 1 tablet (7.5 mg total) by mouth daily. 06/30/23   Hoy Register, MD  metFORMIN (GLUCOPHAGE) 500 MG tablet Take 1 tablet (500 mg total) by mouth 2 (two) times daily with a meal. 10/28/22   Claiborne Rigg, NP  methocarbamol (ROBAXIN) 500 MG tablet Take 1 tablet (500 mg total) by mouth 2 (two) times daily. 08/27/23   Garlon Hatchet, PA-C  nystatin cream (MYCOSTATIN) Apply 1 Application topically 2 (two) times daily. 04/13/23   Mayers, Cari S, PA-C      Allergies    Patient has no known allergies.    Review of Systems   Review of Systems  Genitourinary:  Positive for vaginal bleeding.  All other systems reviewed and are negative.   Physical Exam Updated Vital Signs BP 106/68   Pulse 73   Temp 97.6 F (36.4 C)   Resp 15   Ht 5\' 2"  (1.575 m)   Wt 102.5 kg   LMP 07/19/2023 (Approximate)   SpO2 100%   BMI 41.33 kg/m  Physical Exam Vitals and nursing note reviewed.  Constitutional:      General: She is not in acute distress.    Appearance: She is well-developed.  HENT:     Head: Normocephalic and atraumatic.  Eyes:     Conjunctiva/sclera: Conjunctivae normal.  Cardiovascular:     Rate and Rhythm: Normal rate and regular rhythm.     Heart sounds: No murmur heard. Pulmonary:  Effort: Pulmonary effort is normal. No respiratory distress.     Breath sounds: Normal breath sounds.  Abdominal:     Palpations: Abdomen is soft.     Tenderness: There is no abdominal tenderness.     Comments: Abdomen soft compressible  Musculoskeletal:        General: No swelling.     Cervical back: Neck supple.  Skin:    General: Skin is warm and dry.     Capillary Refill: Capillary refill takes less than 2 seconds.  Neurological:     Mental Status: She is alert.  Psychiatric:        Mood and Affect: Mood normal.     ED Results / Procedures / Treatments   Labs (all labs ordered are listed, but only abnormal results are displayed) Labs Reviewed  COMPREHENSIVE METABOLIC  PANEL WITH GFR - Abnormal; Notable for the following components:      Result Value   AST 14 (*)    All other components within normal limits  URINALYSIS, ROUTINE W REFLEX MICROSCOPIC - Abnormal; Notable for the following components:   Color, Urine RED (*)    APPearance CLOUDY (*)    Hgb urine dipstick LARGE (*)    Protein, ur 100 (*)    Leukocytes,Ua TRACE (*)    All other components within normal limits  CBC  HCG, SERUM, QUALITATIVE  SAMPLE TO BLOOD BANK    EKG None  Radiology No results found.  Procedures Procedures   Medications Ordered in ED Medications  ibuprofen (ADVIL) tablet 800 mg (has no administration in time range)  tranexamic acid (LYSTEDA) tablet 1,300 mg (has no administration in time range)    ED Course/ Medical Decision Making/ A&P Clinical Course as of 09/23/23 0431  Thu Sep 23, 2023  0419 650mg  tablets, 2 tabs, 3x/day, 5 days. Tranexamic acid [CG]  0421 Ibuprofen 800 tid [CG]  0421 Provera 20mg  daily [CG]    Clinical Course User Index [CG] Al Decant, PA-C   Medical Decision Making  33 year old female presents for evaluation.  Please see HPI for further details.  On examination patient is afebrile and nontachycardic.  Her lung sounds are clear bilaterally, she is not hypoxic.  Abdomen is soft and compressible with no tenderness noted.  Neurological examinations at baseline without focal neurodeficits.  Overall patient nontoxic in appearance.  Reassuring vital signs.  Patient reports that her menstrual cycles began.  This most likely cause of patient heavy menstrual bleeding.  Considered ultrasound imaging but do not see indication at this time.  Will collect lab work and reassess.  CBC without leukocytosis, no anemia with a hemoglobin 12.8.  Metabolic panel without elevation of LFTs, no anion gap elevation, no electrolyte abnormalities.  Urinalysis shows large hemoglobin, leukocytes, protein, patient denies dysuria.  Pregnancy testing  negative.  Discussed case with on-call gynecologist Dr. Lynetta Mare.  Gynecology advises starting patient on 1300 mg of tranexamic acid 3 times a day for 5 days.  Also recommends 800 mg ibuprofen 3 times a day for 5 days.  Patient received her first dose of both of the above-stated medications here in the department.  Patient to the rest of her medication sent to her pharmacy on file.  Patient will be referred to OB/GYN for further management and care.  She is stable to discharge home.   Final Clinical Impression(s) / ED Diagnoses Final diagnoses:  Vaginal bleeding    Rx / DC Orders ED Discharge Orders  Ordered    tranexamic acid (LYSTEDA) 650 MG TABS tablet  3 times daily        09/23/23 0428    ibuprofen (ADVIL) 800 MG tablet  3 times daily        09/23/23 0428              Al Decant, PA-C 09/23/23 0431    Nira Conn, MD 09/23/23 [phone]

## 2023-09-23 NOTE — ED Notes (Signed)
 Pt unhooked self from vital signs machine monitoring and did not want further vitals taken.

## 2023-09-23 NOTE — Discharge Instructions (Addendum)
 It was a pleasure taking part in your care.  As discussed, I am starting on a medication called tranexamic acid.  Please take 1300 mg 3 times a day for 5 days.  Please also begin taking Anter milligrams of ibuprofen 3 times a day for 5 days.  Please follow-up with OB/GYN, [facility].  Please call and make an appointment to be seen.  Please read attached guide concerning abnormal uterine bleeding.  If you develop any new or worsening symptoms please return to the ED.

## 2023-10-04 ENCOUNTER — Other Ambulatory Visit: Payer: Self-pay

## 2023-10-04 ENCOUNTER — Ambulatory Visit: Payer: Self-pay | Attending: Nurse Practitioner | Admitting: Nurse Practitioner

## 2023-10-04 VITALS — BP 136/81 | HR 77 | Resp 20 | Ht 62.0 in | Wt 220.0 lb

## 2023-10-04 DIAGNOSIS — F320 Major depressive disorder, single episode, mild: Secondary | ICD-10-CM

## 2023-10-04 DIAGNOSIS — F419 Anxiety disorder, unspecified: Secondary | ICD-10-CM

## 2023-10-04 DIAGNOSIS — N939 Abnormal uterine and vaginal bleeding, unspecified: Secondary | ICD-10-CM

## 2023-10-04 DIAGNOSIS — B354 Tinea corporis: Secondary | ICD-10-CM

## 2023-10-04 DIAGNOSIS — R110 Nausea: Secondary | ICD-10-CM

## 2023-10-04 DIAGNOSIS — M25511 Pain in right shoulder: Secondary | ICD-10-CM

## 2023-10-04 MED ORDER — HYDROXYZINE HCL 10 MG PO TABS
10.0000 mg | ORAL_TABLET | Freq: Three times a day (TID) | ORAL | 1 refills | Status: AC | PRN
  Filled 2023-10-04: qty 270, 90d supply, fill #0

## 2023-10-04 MED ORDER — LIDOCAINE 5 % EX PTCH
1.0000 | MEDICATED_PATCH | CUTANEOUS | 1 refills | Status: AC
  Filled 2023-10-04: qty 10, 10d supply, fill #0
  Filled 2023-10-26 – 2023-11-10 (×2): qty 10, 10d supply, fill #1

## 2023-10-04 MED ORDER — BUPROPION HCL ER (XL) 150 MG PO TB24
150.0000 mg | ORAL_TABLET | Freq: Every day | ORAL | 1 refills | Status: DC
  Filled 2023-10-04: qty 90, 90d supply, fill #0
  Filled 2023-12-28: qty 90, 90d supply, fill #1

## 2023-10-04 MED ORDER — CLOTRIMAZOLE-BETAMETHASONE 1-0.05 % EX CREA
1.0000 | TOPICAL_CREAM | Freq: Two times a day (BID) | CUTANEOUS | 1 refills | Status: AC
  Filled 2023-10-04: qty 60, 30d supply, fill #0

## 2023-10-04 MED ORDER — FAMOTIDINE 40 MG PO TABS
40.0000 mg | ORAL_TABLET | Freq: Every day | ORAL | 1 refills | Status: AC
  Filled 2023-10-04: qty 30, 30d supply, fill #0
  Filled 2023-11-10: qty 30, 30d supply, fill #1

## 2023-10-04 NOTE — Progress Notes (Signed)
 Assessment & Plan:  Rhonda Avila was seen today for depression.  Diagnoses and all orders for this visit:  Current mild episode of major depressive disorder without prior episode (HCC) -     buPROPion  (WELLBUTRIN  XL) 150 MG 24 hr tablet; Take 1 tablet (150 mg total) by mouth daily.  Anxiety -     hydrOXYzine  (ATARAX ) 10 MG tablet; Take 1 tablet (10 mg total) by mouth every 8 (eight) hours as needed. For itching  Nausea -     H. pylori breath test; Future -     famotidine  (PEPCID ) 40 MG tablet; Take 1 tablet (40 mg total) by mouth daily. nausea  Abnormal uterine bleeding (AUB) -     Ambulatory referral to Gynecology -     US  PELVIC COMPLETE WITH TRANSVAGINAL; Future  Tinea corporis -     clotrimazole -betamethasone  (LOTRISONE ) cream; Apply 1 Application topically 2 (two) times daily. Apply to underarms  Acute pain of right shoulder -     lidocaine  (LIDODERM ) 5 %; Place 1 patch onto the skin daily. Remove and discard patch within 12 hours or as directed by MD.   Patient has been advised to apply for financial assistance and schedule to see our financial counselor.   Patient has been counseled on age-appropriate routine health concerns for screening and prevention. These are reviewed and up-to-date. Referrals have been placed accordingly. Immunizations are up-to-date    or declined.    Subjective:   Chief Complaint  Patient presents with   Depression    Rhonda Avila 33 y.o. female presents to office today for follow-up to anxiety and depression.  She is currently prescribed bupropion  and hydroxyzine .  Taking it as prescribed.  Has noted improvement in mood lability and PHQ-9 score has improved    10/04/2023   10:24 AM 05/31/2023   11:52 AM 04/26/2023    2:48 PM 03/29/2023   10:33 AM 03/25/2023    4:48 PM  Depression screen PHQ 2/9  Decreased Interest 2 3 0 2 2  Down, Depressed, Hopeless 1 2 0 1 2  PHQ - 2 Score 3 5 0 3 4  Altered sleeping 3 3  3 3   Tired,  decreased energy 3 3  3 3   Change in appetite 0 3  3 3   Feeling bad or failure about yourself  0 0  0 0  Trouble concentrating 2 1  0 0  Moving slowly or fidgety/restless 0 3  3 3   Suicidal thoughts 1 0  0 0  PHQ-9 Score 12 18  15 16   Difficult doing work/chores  Somewhat difficult   Very difficult       10/04/2023   10:24 AM 05/31/2023   11:52 AM 03/29/2023   10:34 AM 03/25/2023    5:19 PM  GAD 7 : Generalized Anxiety Score  Nervous, Anxious, on Edge  3 2 0  Control/stop worrying 3 2 3 2   Worry too much - different things 3 3 3 2   Trouble relaxing 1 2 2 1   Restless 1 0 1 1  Easily annoyed or irritable 3 3 3 3   Afraid - awful might happen 0 2 0 0  Total GAD 7 Score  15 14 9   Anxiety Difficulty Somewhat difficult Somewhat difficult  Very difficult     Reports persistent nausea which started a few months ago.  Associated symptoms include burning in throat which started a few days ago.  She has not tried anything over-the-counter for her symptoms.  Weight is down over 20 pounds.  She has made significant improvements in her diet.  Cut back on sodas.   Experiencing abnormal uterine bleeding with worsening symptoms including heavy vaginal bleeding with clotting most recently a few weeks ago.  At that time she was seen in the emergency room and prescribed Lysteda  and prescription strength ibuprofen .  Instructed to follow-up with gynecology as well.  She has not had a pelvic ultrasound.  She was seen in the emergency room on August 26, 2023 with right arm, scapula and shoulder pain.  Given lidocaine  patch and methocarbamol  which today she reports almost complete resolution of symptoms.  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  She also sleeps mostly on her right side.  Review of Systems  Constitutional:  Negative for fever, malaise/fatigue and weight loss.  HENT: Negative.  Negative for  nosebleeds.   Eyes: Negative.  Negative for blurred vision, double vision and photophobia.  Respiratory: Negative.  Negative for cough and shortness of breath.   Cardiovascular: Negative.  Negative for chest pain, palpitations and leg swelling.  Gastrointestinal:  Positive for nausea. Negative for abdominal pain, blood in stool, constipation, diarrhea, heartburn, melena and vomiting.  Genitourinary:        AUB  Musculoskeletal: Negative.  Negative for myalgias.  Neurological: Negative.  Negative for dizziness, focal weakness, seizures and headaches.  Psychiatric/Behavioral:  Positive for depression. Negative for suicidal ideas. The patient is nervous/anxious.     Past Medical History:  Diagnosis Date   HSV (herpes simplex virus) infection     Past Surgical History:  Procedure Laterality Date   NO PAST SURGERIES      Family History  Problem Relation Age of Onset   Hypertension Mother    Diabetes Mother    Arthritis Mother    Hypertension Maternal Grandmother    Hypertension Sister     Social History Reviewed with no changes to be made today.   Outpatient Medications Prior to Visit  Medication Sig Dispense Refill   ibuprofen  (ADVIL ) 800 MG tablet Take 1 tablet (800 mg total) by mouth 3 (three) times daily. 21 tablet 0   meloxicam  (MOBIC ) 7.5 MG tablet Take 1 tablet (7.5 mg total) by mouth daily. 30 tablet 1   metFORMIN  (GLUCOPHAGE ) 500 MG tablet Take 1 tablet (500 mg total) by mouth 2 (two) times daily with a meal. 180 tablet 3   methocarbamol  (ROBAXIN ) 500 MG tablet Take 1 tablet (500 mg total) by mouth 2 (two) times daily. 20 tablet 0   nystatin  cream (MYCOSTATIN ) Apply 1 Application topically 2 (two) times daily. 60 g 0   tranexamic acid  (LYSTEDA ) 650 MG TABS tablet Take 2 tablets (1,300 mg total) by mouth 3 (three) times daily. 30 tablet 0   buPROPion  (WELLBUTRIN  XL) 150 MG 24 hr tablet Take 1 tablet (150 mg total) by mouth daily. 30 tablet 1   clotrimazole -betamethasone   (LOTRISONE ) cream Apply 1 Application topically 2 (two) times daily. Apply to underarms 60 g 1   hydrOXYzine  (ATARAX ) 10 MG tablet Take 1 tablet (10 mg total) by mouth every 8 (eight) hours as needed. For itching 270 tablet 0   lidocaine  (LIDODERM ) 5 % Place 1 patch onto the skin daily. Remove and discard patch within 12 hours or as directed by MD. 10 patch 0   No facility-administered medications prior to visit.    No  Known Allergies     Objective:    BP 136/81 (BP Location: Left Arm, Patient Position: Sitting, Cuff Size: Normal)   Pulse 77   Resp 20   Ht 5\' 2"  (1.575 m)   Wt 220 lb (99.8 kg)   LMP 09/21/2023 (Exact Date)   SpO2 98%   BMI 40.24 kg/m  Wt Readings from Last 3 Encounters:  10/04/23 220 lb (99.8 kg)  09/22/23 225 lb 15.5 oz (102.5 kg)  08/26/23 226 lb (102.5 kg)    Physical Exam Vitals and nursing note reviewed.  Constitutional:      Appearance: She is well-developed.  HENT:     Head: Normocephalic and atraumatic.  Cardiovascular:     Rate and Rhythm: Normal rate and regular rhythm.     Heart sounds: Normal heart sounds. No murmur heard.    No friction rub. No gallop.  Pulmonary:     Effort: Pulmonary effort is normal. No tachypnea or respiratory distress.     Breath sounds: Normal breath sounds. No decreased breath sounds, wheezing, rhonchi or rales.  Chest:     Chest wall: No tenderness.  Abdominal:     General: Bowel sounds are normal.     Palpations: Abdomen is soft.  Musculoskeletal:        General: Normal range of motion.     Cervical back: Normal range of motion.  Skin:    General: Skin is warm and dry.  Neurological:     Mental Status: She is alert and oriented to person, place, and time.     Coordination: Coordination normal.  Psychiatric:        Behavior: Behavior normal. Behavior is cooperative.        Thought Content: Thought content normal.        Judgment: Judgment normal.          Patient has been counseled extensively about  nutrition and exercise as well as the importance of adherence with medications and regular follow-up. The patient was given clear instructions to go to ER or return to medical center if symptoms don't improve, worsen or new problems develop. The patient verbalized understanding.   Follow-up: Return if symptoms worsen or fail to improve.   Collins Dean, FNP-BC [facility] [place], [place] [phone]   10/04/2023, 1:02 PM

## 2023-10-04 NOTE — Progress Notes (Signed)
 Patient has been experiencing nausea

## 2023-10-11 ENCOUNTER — Encounter: Payer: Self-pay | Admitting: Nurse Practitioner

## 2023-10-11 ENCOUNTER — Ambulatory Visit (HOSPITAL_COMMUNITY)
Admission: RE | Admit: 2023-10-11 | Discharge: 2023-10-11 | Disposition: A | Discharge status: Home / Self Care | Payer: Self-pay | Source: Ambulatory Visit | Attending: Nurse Practitioner | Admitting: Nurse Practitioner

## 2023-10-11 ENCOUNTER — Ambulatory Visit: Payer: Self-pay | Attending: Nurse Practitioner

## 2023-10-11 DIAGNOSIS — R110 Nausea: Secondary | ICD-10-CM

## 2023-10-11 DIAGNOSIS — N939 Abnormal uterine and vaginal bleeding, unspecified: Secondary | ICD-10-CM | POA: Insufficient documentation

## 2023-10-12 LAB — H. PYLORI BREATH COLLECTION

## 2023-10-12 LAB — H. PYLORI BREATH TEST: H pylori Breath Test: POSITIVE — AB

## 2023-10-17 ENCOUNTER — Other Ambulatory Visit: Payer: Self-pay | Admitting: Nurse Practitioner

## 2023-10-17 ENCOUNTER — Encounter: Payer: Self-pay | Admitting: Nurse Practitioner

## 2023-10-17 DIAGNOSIS — A048 Other specified bacterial intestinal infections: Secondary | ICD-10-CM

## 2023-10-17 MED ORDER — BISMUTH SUBSALICYLATE 262 MG PO CHEW
524.0000 mg | CHEWABLE_TABLET | Freq: Three times a day (TID) | ORAL | 0 refills | Status: AC
  Filled 2023-10-17: qty 112, 14d supply, fill #0

## 2023-10-17 MED ORDER — TETRACYCLINE HCL 500 MG PO CAPS
500.0000 mg | ORAL_CAPSULE | Freq: Four times a day (QID) | ORAL | 0 refills | Status: AC
  Filled 2023-10-17: qty 56, 14d supply, fill #0

## 2023-10-17 MED ORDER — PANTOPRAZOLE SODIUM 20 MG PO TBEC
20.0000 mg | DELAYED_RELEASE_TABLET | Freq: Two times a day (BID) | ORAL | 0 refills | Status: DC
  Filled 2023-10-17: qty 28, 14d supply, fill #0

## 2023-10-17 MED ORDER — METRONIDAZOLE 500 MG PO TABS
500.0000 mg | ORAL_TABLET | Freq: Three times a day (TID) | ORAL | 0 refills | Status: AC
Start: 2023-10-17 — End: 2023-11-02
  Filled 2023-10-17: qty 42, 14d supply, fill #0

## 2023-10-17 NOTE — Progress Notes (Signed)
 PPI standard dose? Twice daily 14 Generics prescribed separately: $75?  Bismuth subsalicylate 300 or 524 mg or Bismuth subcitrate 120 to 300 mg (not available in [place] ) 4 times daily    Tetracycline 500 mg     Metronidazole  500 mg 3 or 4 times daily

## 2023-10-18 ENCOUNTER — Other Ambulatory Visit: Payer: Self-pay

## 2023-10-19 ENCOUNTER — Other Ambulatory Visit: Payer: Self-pay

## 2023-10-26 ENCOUNTER — Other Ambulatory Visit: Payer: Self-pay

## 2023-10-26 ENCOUNTER — Other Ambulatory Visit: Payer: Self-pay | Admitting: Nurse Practitioner

## 2023-10-26 DIAGNOSIS — A048 Other specified bacterial intestinal infections: Secondary | ICD-10-CM

## 2023-10-29 ENCOUNTER — Other Ambulatory Visit: Payer: Self-pay

## 2023-11-05 ENCOUNTER — Other Ambulatory Visit: Payer: Self-pay

## 2023-11-10 ENCOUNTER — Other Ambulatory Visit: Payer: Self-pay | Admitting: Nurse Practitioner

## 2023-11-10 ENCOUNTER — Other Ambulatory Visit: Payer: Self-pay

## 2023-11-10 DIAGNOSIS — E282 Polycystic ovarian syndrome: Secondary | ICD-10-CM

## 2023-11-10 MED ORDER — METFORMIN HCL 500 MG PO TABS
500.0000 mg | ORAL_TABLET | Freq: Two times a day (BID) | ORAL | 3 refills | Status: DC
  Filled 2023-11-10: qty 180, 90d supply, fill #0
  Filled 2023-12-28: qty 180, 90d supply, fill #1

## 2023-11-11 ENCOUNTER — Other Ambulatory Visit: Payer: Self-pay

## 2023-12-28 ENCOUNTER — Other Ambulatory Visit: Payer: Self-pay

## 2023-12-31 ENCOUNTER — Other Ambulatory Visit: Payer: Self-pay

## 2024-02-08 ENCOUNTER — Other Ambulatory Visit: Payer: Self-pay | Admitting: Family Medicine

## 2024-02-08 DIAGNOSIS — G8929 Other chronic pain: Secondary | ICD-10-CM

## 2024-02-10 ENCOUNTER — Other Ambulatory Visit: Payer: Self-pay

## 2024-02-10 MED ORDER — MELOXICAM 7.5 MG PO TABS
7.5000 mg | ORAL_TABLET | Freq: Every day | ORAL | 0 refills | Status: DC
  Filled 2024-02-10: qty 30, 30d supply, fill #0

## 2024-02-10 NOTE — Telephone Encounter (Signed)
 Requested Prescriptions  Pending Prescriptions Disp Refills   meloxicam  (MOBIC ) 7.5 MG tablet 30 tablet 1    Sig: Take 1 tablet (7.5 mg total) by mouth daily.     Analgesics:  COX2 Inhibitors Failed - 02/10/2024  1:35 PM      Failed - Manual Review: Labs are only required if the patient has taken medication for more than 8 weeks.      Failed - AST in normal range and within 360 days    AST  Date Value Ref Range Status  09/22/2023 14 (L) 15 - 41 U/L Final         Passed - HGB in normal range and within 360 days    Hemoglobin  Date Value Ref Range Status  09/22/2023 12.8 12.0 - 15.0 g/dL Final  88/88/7975 86.6 11.1 - 15.9 g/dL Final         Passed - Cr in normal range and within 360 days    Creat  Date Value Ref Range Status  07/10/2013 0.79 0.50 - 1.10 mg/dL Final   Creatinine, Ser  Date Value Ref Range Status  09/22/2023 0.71 0.44 - 1.00 mg/dL Final   Creatinine, Urine  Date Value Ref Range Status  06/18/2018 54.00 mg/dL Final         Passed - HCT in normal range and within 360 days    HCT  Date Value Ref Range Status  09/22/2023 39.1 36.0 - 46.0 % Final   Hematocrit  Date Value Ref Range Status  04/26/2023 39.4 34.0 - 46.6 % Final         Passed - ALT in normal range and within 360 days    ALT  Date Value Ref Range Status  09/22/2023 17 0 - 44 U/L Final         Passed - eGFR is 30 or above and within 360 days    GFR, Est African American  Date Value Ref Range Status  07/10/2013 >89 mL/min Final   GFR calc Af Amer  Date Value Ref Range Status  12/06/2019 138 >59 mL/min/1.73 Final    Comment:    **[facility] currently reports eGFR in compliance with the current**   recommendations of the [employer]. [facility] will   update reporting as new guidelines are published from the NKF-ASN   Task force.    GFR, Est Non African American  Date Value Ref Range Status  07/10/2013 >89 mL/min Final    Comment:      The estimated GFR is a calculation  valid for adults (>=91 years old) that uses the CKD-EPI algorithm to adjust for age and sex. It is   not to be used for children, pregnant women, hospitalized patients,    patients on dialysis, or with rapidly changing kidney function. According to the NKDEP, eGFR >89 is normal, 60-89 shows mild impairment, 30-59 shows moderate impairment, 15-29 shows severe impairment and <15 is ESRD.     GFR, Estimated  Date Value Ref Range Status  09/22/2023 >60 >60 mL/min Final    Comment:    (NOTE) Calculated using the CKD-EPI Creatinine Equation (2021)    eGFR  Date Value Ref Range Status  04/26/2023 98 >59 mL/min/1.73 Final         Passed - Patient is not pregnant      Passed - Valid encounter within last 12 months    Recent Outpatient Visits           4 months ago Current mild  episode of major depressive disorder without prior episode [facility])   [facility] [place]. [facility] [place], [place] W, NP   8 months ago Current mild episode of major depressive disorder without prior episode [facility])   [facility] [place]. [facility] Theotis Haze ORN, NP   9 months ago Annual physical exam   [facility] [place]. [facility] Theotis Haze ORN, NP   1 year ago History of anemia   [facility] [place]. [facility] Theotis Haze ORN, NP   2 years ago Cutaneous skin tags    [facility] [place]. [facility] Theotis Haze ORN, [place]

## 2024-02-21 ENCOUNTER — Telehealth: Payer: Self-pay | Admitting: Nurse Practitioner

## 2024-02-21 NOTE — Telephone Encounter (Signed)
Pt confirmed appt 9/9

## 2024-02-22 ENCOUNTER — Other Ambulatory Visit (HOSPITAL_COMMUNITY)
Admission: RE | Admit: 2024-02-22 | Discharge: 2024-02-22 | Disposition: A | Discharge status: Home / Self Care | Payer: Self-pay | Source: Ambulatory Visit | Attending: Nurse Practitioner | Admitting: Nurse Practitioner

## 2024-02-22 ENCOUNTER — Ambulatory Visit: Payer: Self-pay | Attending: Nurse Practitioner | Admitting: Nurse Practitioner

## 2024-02-22 ENCOUNTER — Other Ambulatory Visit: Payer: Self-pay

## 2024-02-22 ENCOUNTER — Encounter: Payer: Self-pay | Admitting: Nurse Practitioner

## 2024-02-22 VITALS — BP 129/80 | HR 71 | Resp 18 | Ht 62.0 in | Wt 221.6 lb

## 2024-02-22 DIAGNOSIS — R102 Pelvic and perineal pain: Secondary | ICD-10-CM

## 2024-02-22 DIAGNOSIS — A048 Other specified bacterial intestinal infections: Secondary | ICD-10-CM

## 2024-02-22 DIAGNOSIS — Z23 Encounter for immunization: Secondary | ICD-10-CM

## 2024-02-22 DIAGNOSIS — R109 Unspecified abdominal pain: Secondary | ICD-10-CM | POA: Insufficient documentation

## 2024-02-22 DIAGNOSIS — F320 Major depressive disorder, single episode, mild: Secondary | ICD-10-CM

## 2024-02-22 DIAGNOSIS — G5603 Carpal tunnel syndrome, bilateral upper limbs: Secondary | ICD-10-CM

## 2024-02-22 DIAGNOSIS — M722 Plantar fascial fibromatosis: Secondary | ICD-10-CM

## 2024-02-22 MED ORDER — MELOXICAM 7.5 MG PO TABS
7.5000 mg | ORAL_TABLET | Freq: Every day | ORAL | 1 refills | Status: AC
  Filled 2024-02-22 – 2024-03-15 (×2): qty 30, 15d supply, fill #0

## 2024-02-22 MED ORDER — BUPROPION HCL ER (XL) 150 MG PO TB24
150.0000 mg | ORAL_TABLET | Freq: Every day | ORAL | 1 refills | Status: AC
  Filled 2024-02-22: qty 90, 90d supply, fill #0

## 2024-02-22 MED ORDER — GABAPENTIN 300 MG PO CAPS
300.0000 mg | ORAL_CAPSULE | Freq: Three times a day (TID) | ORAL | 3 refills | Status: AC
  Filled 2024-02-22 – 2024-03-15 (×2): qty 90, 30d supply, fill #0

## 2024-02-22 MED ORDER — MELOXICAM 7.5 MG PO TABS
7.5000 mg | ORAL_TABLET | Freq: Every day | ORAL | 0 refills | Status: DC
  Filled 2024-02-22: qty 30, 30d supply, fill #0

## 2024-02-22 NOTE — Progress Notes (Signed)
 Assessment & Plan:  Rhonda Avila was seen today for pelvic pain.  Diagnoses and all orders for this visit:  Pelvic pain -     Urinalysis, Complete -     Cervicovaginal ancillary only Oligomenorrhea and secondary amenorrhea with abnormal endometrial thickening, evaluation for infertility Oligomenorrhea and secondary amenorrhea with no menstrual cycle since April. Previous ultrasound showed abnormal endometrial thickening. Discussed potential early menopause and hormonal imbalance. - Maintain gynecology appointment on September 27 for evaluation of endometrial thickening and infertility. - Discuss potential early menopause and hormonal imbalance with gynecology.  Suspected polycystic ovary syndrome (PCOS) Suspected PCOS with oligomenorrhea, secondary amenorrhea, facial hair growth, and acne. Previous ultrasound did not show ovarian cysts. Spironolactone considered but I would like for her to discuss this with GYN - Discuss PCOS symptoms and potential hormonal testing with gynecology. - Continue metformin  for PCOS management.  H. pylori infection -     H. pylori breath test Helicobacter pylori infection, status post treatment H. pylori infection previously treated with antibiotics. - Perform breath test to confirm eradication of H. pylori.  Current mild episode of major depressive disorder without prior episode (HCC) -     buPROPion  (WELLBUTRIN  XL) 150 MG 24 hr tablet; Take 1 tablet (150 mg total) by mouth daily.  Need for influenza vaccination -     Flu vaccine trivalent PF, 6mos and older(Flulaval ,Afluria,Fluarix,Fluzone)  Plantar fasciitis, bilateral -     meloxicam  (MOBIC ) 7.5 MG tablet; Take 1-2 tablets (7.5-15 mg total) by mouth daily for joint pain. -     Ambulatory referral to Podiatry Plantar fasciitis, right worse than left Plantar fasciitis with right foot more affected. Current treatment with meloxicam  and exercises ineffective. - Refer to podiatry for evaluation and  management. - Provide plantar fasciitis exercises. - Ensure supportive footwear is worn consistently.  Carpal tunnel syndrome on both sides -     gabapentin  (NEURONTIN ) 300 MG capsule; Take 1 capsule (300 mg total) by mouth 3 (three) times daily.  Obesity Obesity with difficulty losing weight. Current exercise regimen insufficient for weight loss. - Increase daily steps to 8,000-10,000 for weight loss.  Dry eyes Dry eyes with symptoms possibly related to prolonged computer use. Previous lubricating eye drops provided limited relief. - Try Refresh or [employer] Advanced Eye Relief for dry eyes.   Patient has been counseled on age-appropriate routine health concerns for screening and prevention. These are reviewed and up-to-date. Referrals have been placed accordingly. Immunizations are up-to-date or declined.    Subjective:   Chief Complaint  Patient presents with   Pelvic Pain   History of Present Illness Rhonda Avila is a 33 year old female with history of PCOS who presents with pelvic pain and concerns about infertility.  She has been experiencing pelvic pain for the past two to three months, occurring both before her expected menstrual period and during intercourse. She has not had a menstrual period since April, making it difficult to determine if she is ovulating. A pelvic ultrasound performed in April showed no cysts on her ovaries. She is concerned about her inability to conceive, noting that she has not been able to get pregnant despite trying. She has a gynecology appointment scheduled for September 27th to address these concerns, including a previously noted thickened endometrial lining.  She reports sharp pain in her heel and ankle. She has been taking meloxicam  for joint pain but notes it has not alleviated her foot pain. She has been performing stretching exercises but  finds them ineffective. She wears sneakers for support but occasionally uses Crocs,  which she finds comfortable.  She has a prescription for gabapentin  for wrist pain/carpal tunnel symptoms, which she plans to refill. Her mother advised against taking meloxicam  daily, so she has been using it as needed.  She has noticed hair growth on her face and frequent breakouts on her chin, which she associates with PCOS. She is on metformin  but is concerned about these symptoms.  She reports dry, itchy eyes, especially after prolonged computer use, and has tried lubricating eye drops with limited relief.  Review of Systems  Constitutional:  Negative for fever, malaise/fatigue and weight loss.  HENT: Negative.  Negative for nosebleeds.   Eyes:  Positive for pain. Negative for blurred vision, double vision, photophobia, discharge and redness.  Respiratory: Negative.  Negative for cough and shortness of breath.   Cardiovascular: Negative.  Negative for chest pain, palpitations and leg swelling.  Gastrointestinal: Negative.  Negative for heartburn, nausea and vomiting.  Genitourinary:        PELVIC PAIN AUB  Musculoskeletal:  Positive for joint pain. Negative for myalgias.  Neurological: Negative.  Negative for dizziness, focal weakness, seizures and headaches.  Psychiatric/Behavioral: Negative.  Negative for suicidal ideas.     Past Medical History:  Diagnosis Date   HSV (herpes simplex virus) infection     Past Surgical History:  Procedure Laterality Date   NO PAST SURGERIES      Family History  Problem Relation Age of Onset   Hypertension Mother    Diabetes Mother    Arthritis Mother    Hypertension Maternal Grandmother    Hypertension Sister     Social History Reviewed with no changes to be made today.   Outpatient Medications Prior to Visit  Medication Sig Dispense Refill   clotrimazole -betamethasone  (LOTRISONE ) cream Apply 1 Application topically 2 (two) times daily. Apply to underarms 60 g 1   hydrOXYzine  (ATARAX ) 10 MG tablet Take 1 tablet (10 mg total) by  mouth every 8 (eight) hours as needed. For itching 270 tablet 1   lidocaine  (LIDODERM ) 5 % Place 1 patch onto the skin daily. Remove and discard patch within 12 hours or as directed by MD. 10 patch 1   metFORMIN  (GLUCOPHAGE ) 500 MG tablet Take 1 tablet (500 mg total) by mouth 2 (two) times daily with a meal. 180 tablet 3   buPROPion  (WELLBUTRIN  XL) 150 MG 24 hr tablet Take 1 tablet (150 mg total) by mouth daily. 90 tablet 1   ibuprofen  (ADVIL ) 800 MG tablet Take 1 tablet (800 mg total) by mouth 3 (three) times daily. 21 tablet 0   meloxicam  (MOBIC ) 7.5 MG tablet Take 1 tablet (7.5 mg total) by mouth daily. 30 tablet 0   methocarbamol  (ROBAXIN ) 500 MG tablet Take 1 tablet (500 mg total) by mouth 2 (two) times daily. 20 tablet 0   famotidine  (PEPCID ) 40 MG tablet Take 1 tablet (40 mg total) by mouth daily. nausea (Patient not taking: Reported on 02/22/2024) 30 tablet 1   nystatin  cream (MYCOSTATIN ) Apply 1 Application topically 2 (two) times daily. (Patient not taking: Reported on 02/22/2024) 60 g 0   pantoprazole  (PROTONIX ) 20 MG tablet Take 1 tablet (20 mg total) by mouth 2 (two) times daily for 14 days. (Patient not taking: Reported on 02/22/2024) 28 tablet 0   tranexamic acid  (LYSTEDA ) 650 MG TABS tablet Take 2 tablets (1,300 mg total) by mouth 3 (three) times daily. (Patient not taking: Reported on 02/22/2024)  30 tablet 0   No facility-administered medications prior to visit.    No Known Allergies    Objective:    BP 129/80 (BP Location: Left Arm, Patient Position: Sitting, Cuff Size: Normal)   Pulse 71   Resp 18   Ht 5' 2 (1.575 m)   Wt 221 lb 9.6 oz (100.5 kg)   LMP 09/21/2023 (Exact Date)   SpO2 98%   BMI 40.53 kg/m  Wt Readings from Last 3 Encounters:  02/22/24 221 lb 9.6 oz (100.5 kg)  10/04/23 220 lb (99.8 kg)  09/22/23 225 lb 15.5 oz (102.5 kg)    Physical Exam Vitals and nursing note reviewed.  Constitutional:      Appearance: She is well-developed.  HENT:     Head:  Normocephalic and atraumatic.  Cardiovascular:     Rate and Rhythm: Normal rate and regular rhythm.     Heart sounds: Normal heart sounds. No murmur heard.    No friction rub. No gallop.  Pulmonary:     Effort: Pulmonary effort is normal. No tachypnea or respiratory distress.     Breath sounds: Normal breath sounds. No decreased breath sounds, wheezing, rhonchi or rales.  Chest:     Chest wall: No tenderness.  Musculoskeletal:        General: Normal range of motion.     Cervical back: Normal range of motion.  Skin:    General: Skin is warm and dry.  Neurological:     Mental Status: She is alert and oriented to person, place, and time.     Coordination: Coordination normal.  Psychiatric:        Behavior: Behavior normal. Behavior is cooperative.        Thought Content: Thought content normal.        Judgment: Judgment normal.          Patient has been counseled extensively about nutrition and exercise as well as the importance of adherence with medications and regular follow-up. The patient was given clear instructions to go to ER or return to medical center if symptoms don't improve, worsen or new problems develop. The patient verbalized understanding.   Follow-up: Return if symptoms worsen or fail to improve.   Haze LELON Servant, FNP-BC [facility] [place], [place] [phone]   02/22/2024, 5:40 PM

## 2024-02-24 LAB — CERVICOVAGINAL ANCILLARY ONLY
Bacterial Vaginitis (gardnerella): NEGATIVE
Candida Glabrata: NEGATIVE
Candida Vaginitis: NEGATIVE
Chlamydia: NEGATIVE
Comment: NEGATIVE
Comment: NEGATIVE
Comment: NEGATIVE
Comment: NEGATIVE
Comment: NEGATIVE
Comment: NORMAL
Neisseria Gonorrhea: NEGATIVE
Trichomonas: NEGATIVE

## 2024-02-24 LAB — URINALYSIS, COMPLETE
Bilirubin, UA: NEGATIVE
Glucose, UA: NEGATIVE
Ketones, UA: NEGATIVE
Leukocytes,UA: NEGATIVE
Nitrite, UA: NEGATIVE
Protein,UA: NEGATIVE
RBC, UA: NEGATIVE
Specific Gravity, UA: 1.022 (ref 1.005–1.030)
Urobilinogen, Ur: 0.2 mg/dL (ref 0.2–1.0)
pH, UA: 6 (ref 5.0–7.5)

## 2024-02-24 LAB — MICROSCOPIC EXAMINATION
Casts: NONE SEEN /LPF
RBC, Urine: NONE SEEN /HPF (ref 0–2)

## 2024-02-24 LAB — H. PYLORI BREATH COLLECTION

## 2024-02-24 LAB — H. PYLORI BREATH TEST: H pylori Breath Test: POSITIVE — AB

## 2024-02-29 ENCOUNTER — Ambulatory Visit: Payer: Self-pay | Admitting: Nurse Practitioner

## 2024-03-01 ENCOUNTER — Other Ambulatory Visit: Payer: Self-pay

## 2024-03-01 ENCOUNTER — Ambulatory Visit: Payer: Self-pay | Admitting: Nurse Practitioner

## 2024-03-01 ENCOUNTER — Ambulatory Visit: Payer: Self-pay | Admitting: Orthopedic Surgery

## 2024-03-02 ENCOUNTER — Other Ambulatory Visit: Payer: Self-pay

## 2024-03-04 ENCOUNTER — Other Ambulatory Visit: Payer: Self-pay | Admitting: Nurse Practitioner

## 2024-03-04 DIAGNOSIS — A048 Other specified bacterial intestinal infections: Secondary | ICD-10-CM

## 2024-03-04 MED ORDER — BISMUTH SUBSALICYLATE 262 MG PO CHEW
524.0000 mg | CHEWABLE_TABLET | Freq: Three times a day (TID) | ORAL | 0 refills | Status: AC
  Filled 2024-03-04: qty 90, 12d supply, fill #0

## 2024-03-04 MED ORDER — TETRACYCLINE HCL 500 MG PO CAPS
500.0000 mg | ORAL_CAPSULE | Freq: Four times a day (QID) | ORAL | 0 refills | Status: DC
  Filled 2024-03-04: qty 56, 14d supply, fill #0

## 2024-03-04 MED ORDER — METRONIDAZOLE 500 MG PO TABS
500.0000 mg | ORAL_TABLET | Freq: Three times a day (TID) | ORAL | 0 refills | Status: DC
  Filled 2024-03-04: qty 42, 14d supply, fill #0

## 2024-03-04 MED ORDER — OMEPRAZOLE 20 MG PO CPDR
20.0000 mg | DELAYED_RELEASE_CAPSULE | Freq: Two times a day (BID) | ORAL | 0 refills | Status: DC
  Filled 2024-03-04: qty 28, 14d supply, fill #0

## 2024-03-06 ENCOUNTER — Other Ambulatory Visit: Payer: Self-pay | Admitting: Nurse Practitioner

## 2024-03-06 ENCOUNTER — Other Ambulatory Visit: Payer: Self-pay

## 2024-03-06 DIAGNOSIS — A048 Other specified bacterial intestinal infections: Secondary | ICD-10-CM

## 2024-03-06 MED ORDER — METRONIDAZOLE 500 MG PO TABS
500.0000 mg | ORAL_TABLET | Freq: Three times a day (TID) | ORAL | 0 refills | Status: DC
  Filled 2024-03-06: qty 42, 14d supply, fill #0

## 2024-03-06 MED ORDER — AMOXICILLIN 500 MG PO CAPS
1000.0000 mg | ORAL_CAPSULE | Freq: Three times a day (TID) | ORAL | 0 refills | Status: AC
  Filled 2024-03-06: qty 84, 14d supply, fill #0

## 2024-03-06 MED ORDER — OMEPRAZOLE 40 MG PO CPDR
40.0000 mg | DELAYED_RELEASE_CAPSULE | Freq: Two times a day (BID) | ORAL | 0 refills | Status: AC
  Filled 2024-03-06: qty 28, 14d supply, fill #0

## 2024-03-08 ENCOUNTER — Other Ambulatory Visit: Payer: Self-pay

## 2024-03-10 ENCOUNTER — Encounter: Payer: Self-pay | Admitting: Obstetrics & Gynecology

## 2024-03-10 ENCOUNTER — Ambulatory Visit (INDEPENDENT_AMBULATORY_CARE_PROVIDER_SITE_OTHER): Payer: Self-pay | Admitting: Obstetrics & Gynecology

## 2024-03-10 ENCOUNTER — Other Ambulatory Visit: Payer: Self-pay | Admitting: Obstetrics & Gynecology

## 2024-03-10 ENCOUNTER — Other Ambulatory Visit: Payer: Self-pay

## 2024-03-10 VITALS — BP 119/80 | HR 78 | Wt 218.5 lb

## 2024-03-10 DIAGNOSIS — N970 Female infertility associated with anovulation: Secondary | ICD-10-CM

## 2024-03-10 DIAGNOSIS — F5231 Female orgasmic disorder: Secondary | ICD-10-CM

## 2024-03-10 DIAGNOSIS — Z1331 Encounter for screening for depression: Secondary | ICD-10-CM

## 2024-03-10 DIAGNOSIS — N914 Secondary oligomenorrhea: Secondary | ICD-10-CM

## 2024-03-10 MED ORDER — MEDROXYPROGESTERONE ACETATE 10 MG PO TABS
10.0000 mg | ORAL_TABLET | Freq: Every day | ORAL | 2 refills | Status: AC
  Filled 2024-03-10: qty 10, 10d supply, fill #0
  Filled 2024-04-21: qty 10, 10d supply, fill #1
  Filled 2024-06-13: qty 10, 10d supply, fill #2

## 2024-03-10 MED ORDER — LETROZOLE 2.5 MG PO TABS
2.5000 mg | ORAL_TABLET | Freq: Every day | ORAL | 1 refills | Status: DC
  Filled 2024-03-10: qty 5, 5d supply, fill #0
  Filled 2024-04-21: qty 5, 5d supply, fill #1

## 2024-03-10 MED ORDER — METFORMIN HCL 1000 MG PO TABS
1000.0000 mg | ORAL_TABLET | Freq: Two times a day (BID) | ORAL | 5 refills | Status: AC
  Filled 2024-03-10: qty 60, 30d supply, fill #0

## 2024-03-10 NOTE — Progress Notes (Signed)
 GYNECOLOGY OFFICE VISIT NOTE  History:  Rhonda Avila is a 33 y.o. G2P1001 here today for evaluation and management of secondary oligomenorrhea, last period was in April 2025 but had irregular menstrual periods prior.  Has about three-four periods a year.  Suspected PCOS with oligomenorrhea, secondary amenorrhea, facial hair growth, and acne.  No laboratory testing done, but had normal pelvic ultrasound with normal ovaries and 7.5 mm normal endometrial thickness.  She is Metformin  500 mg po bid, no concerning side effects. Also trying to lose weight.  Patient had fertility help last pregnancy, wants this again.  Patient also is worried about decreased orgasmic response lately, wants to discuss management.  She denies any abnormal vaginal discharge, bleeding, pelvic pain or other concerns.  Past Medical History:  Diagnosis Date   HSV (herpes simplex virus) infection     Past Surgical History:  Procedure Laterality Date   NO PAST SURGERIES      The following portions of the patient's history were reviewed and updated as appropriate: allergies, current medications, past family history, past medical history, past social history, past surgical history and problem list.   Health Maintenance:  ASCUS pap and negative HRHPV on 07/25/2019  Review of Systems:  Pertinent items noted in HPI and remainder of comprehensive ROS otherwise negative.  Physical Exam:  BP 119/80   Pulse 78   Wt 218 lb 8 oz (99.1 kg)   LMP 09/21/2023 (Exact Date)   BMI 39.96 kg/m  CONSTITUTIONAL: Well-developed, well-nourished female in no acute distress.  HEENT:  Normocephalic, atraumatic. External right and left ear normal. No scleral icterus.  NECK: Normal range of motion, supple, no masses noted on observation SKIN: No rash noted. Not diaphoretic. No erythema. No pallor. MUSCULOSKELETAL: Normal range of motion. No edema noted. NEUROLOGIC: Alert and oriented to person, place, and time. Normal muscle  tone coordination. No cranial nerve deficit noted on observation. PSYCHIATRIC: Normal mood and affect. Normal behavior. Normal judgment and thought content. CARDIOVASCULAR: Normal heart rate noted RESPIRATORY: Effort and breath sounds normal, no problems with respiration noted ABDOMEN: No masses or other overt distention noted on observation. No tenderness.   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  Advised her to research SCREAM creams for arousal/orgasm aid. If she wants this, she can send request for this to be prescribed for her.  Will also follow up hormone levels being checked today.  2. Infertility associated with anovulation Discussed infertility, she wants ovulation induction agents.  Letrozole  prescribed, 2.5 mg po daily on days 3 to 7 following a spontaneous menses or progestin-induced bleed. If the cycle is ovulatory but pregnancy has not occurred, the same dose should be used in the next cycle. If ovulation does not occur, the dose should be increased to 5 mg/day cycle days 3 to 7, with a maximal dose of 7.5 mg/day.   Provera  10 mg po qd x 10 days also prescribed for progestin-induced bleed. Advised to use ovulation predictor kits and regular intercourse.  Patient was given the option to be referred to a Reproductive Endocrinology and Infertility Specialist now or at any time, she declines referral for now.  - letrozole  (FEMARA ) 2.5 MG tablet; Take 1 tablet (2.5 mg total) by mouth daily. Take on days 3 to 7 following a spontaneous menses or progestin-induced bleed.  Dispense: 5 tablet; Refill: 1 - medroxyPROGESTERone  (PROVERA ) 10 MG tablet; Take 1 tablet (10  mg total) by mouth daily. Use for ten days  Dispense: 10 tablet; Refill: 2  3. Secondary oligomenorrhea (Primary) Will do more formal lab evaluation for her oligomenorrhea. Counseled patient about management of PCOS, recommended weight loss which  helps with restoring ovulatory cycles, decreases glucose intolerance with improvement of metabolic risk, improves fertility/pregnancy rates and helps with overall health.  Even modest weight loss (5 to 10 percent reduction in body weight) in women with PCOS may result in these effects.  PCOS can be also treated with Metformin  given its association with glucose intolerance and insulin resistance.  It can also reduce ovarian androgen production and serum free testosterone concentrations, and help restore normal menstrual cyclicity.  Over 50% of PCOS patients on at least 1500mg  of Metformin  daily have been shown to ovulate successfully. Common side effects include GI intolerance, kidney and liver enzyme irregularities, lactic acidosis.  She was told to increase dosage of current Metformin  to 750 mg po bid for one week, then go up to1000 mg po bid. Will continue to follow.    - Beta hCG quant (ref lab) - Hemoglobin A1c - Testosterone,Free and Total - TSH Rfx on Abnormal to Free T4 - Prolactin - ReproSURE - Comprehensive metabolic panel with GFR - metFORMIN  (GLUCOPHAGE ) 1000 MG tablet; Take 1 tablet (1,000 mg total) by mouth 2 (two) times daily with a meal.  Dispense: 60 tablet; Refill: 5  Routine preventative health maintenance measures emphasized, referred to [facility]. Please refer to After Visit Summary for other counseling recommendations.   Return for follow up as recommended.    I spent 60 minutes dedicated to the care of this patient including pre-visit review of records, face to face time with the patient discussing her conditions and treatments, post visit ordering of medications and appropriate tests or procedures, coordinating care and documenting this visit encounter.    GLORIS HUGGER, MD, FACOG Obstetrician & Gynecologist, [facility] for [employer], [facility]

## 2024-03-10 NOTE — Patient Instructions (Addendum)
 Increase dosage of Metformin  to 1.5 tablets of the 500 mg twice day for one week. If you tolerate this, go to 2 tablets twice a week.  Research SCREAM creams..for arousal/orgasm aids

## 2024-03-11 LAB — TSH RFX ON ABNORMAL TO FREE T4: TSH: 0.645 u[IU]/mL (ref 0.450–4.500)

## 2024-03-13 ENCOUNTER — Ambulatory Visit: Payer: Self-pay | Admitting: Obstetrics & Gynecology

## 2024-03-14 ENCOUNTER — Emergency Department (HOSPITAL_COMMUNITY)
Admission: EM | Admit: 2024-03-14 | Discharge: 2024-03-15 | Discharge status: Against Medical Advice | Payer: Self-pay | Attending: Emergency Medicine | Admitting: Emergency Medicine

## 2024-03-14 ENCOUNTER — Encounter (HOSPITAL_COMMUNITY): Payer: Self-pay | Admitting: Emergency Medicine

## 2024-03-14 ENCOUNTER — Other Ambulatory Visit: Payer: Self-pay

## 2024-03-14 DIAGNOSIS — Z5321 Procedure and treatment not carried out due to patient leaving prior to being seen by health care provider: Secondary | ICD-10-CM | POA: Insufficient documentation

## 2024-03-14 DIAGNOSIS — L292 Pruritus vulvae: Secondary | ICD-10-CM | POA: Insufficient documentation

## 2024-03-14 LAB — URINALYSIS, ROUTINE W REFLEX MICROSCOPIC
Bilirubin Urine: NEGATIVE
Glucose, UA: NEGATIVE mg/dL
Hgb urine dipstick: NEGATIVE
Ketones, ur: NEGATIVE mg/dL
Leukocytes,Ua: NEGATIVE
Nitrite: NEGATIVE
Protein, ur: NEGATIVE mg/dL
Specific Gravity, Urine: 1.021 (ref 1.005–1.030)
pH: 6 (ref 5.0–8.0)

## 2024-03-14 NOTE — ED Triage Notes (Signed)
 Patient with vaginal irritation, itching,  tan colored discharge since Sunday. Noticed this worsened yesterday.  Burning with urination. Noticed a pimple to the area as well. Increased redness and swelling to the area. Denies any changes in body soap, detergent. Denies concern for STI.

## 2024-03-14 NOTE — ED Triage Notes (Signed)
 Patient in ED with complaints of vaginal itching since Sunday. Patient reports her discharge is liquid and chunky. Denies odor.

## 2024-03-15 ENCOUNTER — Other Ambulatory Visit: Payer: Self-pay

## 2024-03-15 ENCOUNTER — Emergency Department (HOSPITAL_COMMUNITY)
Admission: EM | Admit: 2024-03-15 | Discharge: 2024-03-16 | Disposition: A | Discharge status: Home / Self Care | Payer: Self-pay | Attending: Emergency Medicine | Admitting: Emergency Medicine

## 2024-03-15 ENCOUNTER — Encounter (HOSPITAL_COMMUNITY): Payer: Self-pay | Admitting: *Deleted

## 2024-03-15 DIAGNOSIS — N898 Other specified noninflammatory disorders of vagina: Secondary | ICD-10-CM | POA: Insufficient documentation

## 2024-03-15 DIAGNOSIS — R300 Dysuria: Secondary | ICD-10-CM | POA: Insufficient documentation

## 2024-03-15 DIAGNOSIS — Z711 Person with feared health complaint in whom no diagnosis is made: Secondary | ICD-10-CM

## 2024-03-15 NOTE — ED Triage Notes (Signed)
 Pt reports since Sunday she has had vaginal and rectal itching. Tan and whitish vaginal discharge. Burning with urination.

## 2024-03-15 NOTE — ED Provider Notes (Incomplete)
 [place] EMERGENCY DEPARTMENT AT [facility] Provider Note   CSN: [id number] Arrival date & time: 03/15/24  2135     Patient presents with: Vaginal Discharge   [facility] Rhonda Avila is a 33 y.o. female.  {Add pertinent medical, surgical, social history, OB history to YEP:67052} Patient with no pertinent past medical history presents today with complaints of vaginal discharge. Reports that same has been ongoing since Sunday. Nursing note reports rectal discharge, however patient denies this. Reports she is sexually active with 1 partner. Denies abdominal pain. Does report some dysuria as well. Denies abdominal pain, nausea, vomiting, or diarrhea. Reports he last menstrual cycle was in April, however this is normal for her given she has PCOS.    The history is provided by the patient. No language interpreter was used.  Vaginal Discharge      Prior to Admission medications   Medication Sig Start Date End Date Taking? Authorizing Provider  amoxicillin  (AMOXIL ) 500 MG capsule Take 2 capsules (1,000 mg total) by mouth 3 (three) times daily for 14 days. 03/06/24 03/22/24  Fleming, Zelda W, NP  bismuth  subsalicylate (PEPTO BISMOL) 262 MG chewable tablet Chew 2 tablets (524 mg total) by mouth 4 (four) times daily -  before meals and at bedtime for 14 days. 03/04/24 03/20/24  Fleming, Zelda W, NP  buPROPion  (WELLBUTRIN  XL) 150 MG 24 hr tablet Take 1 tablet (150 mg total) by mouth daily. 02/22/24   Fleming, Zelda W, NP  clotrimazole -betamethasone  (LOTRISONE ) cream Apply 1 Application topically 2 (two) times daily. Apply to underarms 10/04/23   Fleming, Zelda W, NP  famotidine  (PEPCID ) 40 MG tablet Take 1 tablet (40 mg total) by mouth daily. nausea 10/04/23   Theotis Haze ORN, NP  gabapentin  (NEURONTIN ) 300 MG capsule Take 1 capsule (300 mg total) by mouth 3 (three) times daily. 02/22/24   Fleming, Zelda W, NP  hydrOXYzine  (ATARAX ) 10 MG tablet Take 1 tablet (10 mg total) by mouth  every 8 (eight) hours as needed. For itching 10/04/23   Theotis Haze ORN, NP  letrozole  (FEMARA ) 2.5 MG tablet Take 1 tablet (2.5 mg total) by mouth daily. Take on days 3 to 7 following a spontaneous menses or progestin-induced bleed. 03/10/24   Anyanwu, Ugonna A, MD  lidocaine  (LIDODERM ) 5 % Place 1 patch onto the skin daily. Remove and discard patch within 12 hours or as directed by MD. 10/04/23   Fleming, Zelda W, NP  medroxyPROGESTERone  (PROVERA ) 10 MG tablet Take 1 tablet (10 mg total) by mouth daily. Use for ten days 03/10/24   Anyanwu, Ugonna A, MD  meloxicam  (MOBIC ) 7.5 MG tablet Take 1-2 tablets (7.5-15 mg total) by mouth daily for joint pain. 02/22/24   Fleming, Zelda W, NP  metFORMIN  (GLUCOPHAGE ) 1000 MG tablet Take 1 tablet (1,000 mg total) by mouth 2 (two) times daily with a meal. 03/10/24   Anyanwu, Gloris LABOR, MD  metroNIDAZOLE  (FLAGYL ) 500 MG tablet Take 1 tablet (500 mg total) by mouth 3 (three) times daily for 14 days. 03/06/24 03/22/24  Theotis Haze ORN, NP  omeprazole  (PRILOSEC) 40 MG capsule Take 1 capsule (40 mg total) by mouth 2 (two) times daily before a meal for 14 days. 03/06/24 03/22/24  Fleming, Zelda W, NP    Allergies: Patient has no known allergies.    Review of Systems  Genitourinary:  Positive for vaginal discharge.  All other systems reviewed and are negative.   Updated Vital Signs BP (!) 127/94 (BP Location: Left Arm)  Pulse 63   Temp 98.5 F (36.9 C)   Resp 16   LMP 09/21/2023 (Exact Date)   SpO2 100%   Physical Exam Vitals and nursing note reviewed. Exam conducted with a chaperone present.  Constitutional:      General: She is not in acute distress.    Appearance: Normal appearance. She is normal weight. She is not ill-appearing, toxic-appearing or diaphoretic.  HENT:     Head: Normocephalic and atraumatic.  Cardiovascular:     Rate and Rhythm: Normal rate.  Pulmonary:     Effort: Pulmonary effort is normal. No respiratory distress.  Abdominal:      General: Abdomen is flat.     Palpations: Abdomen is soft.     Tenderness: There is no abdominal tenderness.  Genitourinary:    Comments: Trace amount of cloudy discharge within the vagina, no vaginal or cervical lesions present. No CMT or adnexal tenderness Musculoskeletal:        General: Normal range of motion.     Cervical back: Normal range of motion.  Skin:    General: Skin is warm and dry.  Neurological:     General: No focal deficit present.     Mental Status: She is alert.  Psychiatric:        Mood and Affect: Mood normal.        Behavior: Behavior normal.     (all labs ordered are listed, but only abnormal results are displayed) Labs Reviewed  WET PREP, GENITAL  URINALYSIS, ROUTINE W REFLEX MICROSCOPIC  PREGNANCY, URINE  GC/CHLAMYDIA PROBE AMP ([place]) NOT AT [facility]    EKG: None  Radiology: No results found.  {Document cardiac monitor, telemetry assessment procedure when appropriate:32947} Procedures   Medications Ordered in the ED - No data to display    {Click here for ABCD2, HEART and other calculators REFRESH Note before signing:1}                              Medical Decision Making Amount and/or Complexity of Data Reviewed Labs: ordered.   ***  {Document critical care time when appropriate  Document review of labs and clinical decision tools ie CHADS2VASC2, etc  Document your independent review of radiology images and any outside records  Document your discussion with family members, caretakers and with consultants  Document social determinants of health affecting pt's care  Document your decision making why or why not admission, treatments were needed:32947:::1}   Final diagnoses:  None    ED Discharge Orders     None

## 2024-03-15 NOTE — ED Provider Notes (Signed)
 [place] EMERGENCY DEPARTMENT AT [facility] Provider Note   CSN: [id number] Arrival date & time: 03/15/24  2135     Patient presents with: Vaginal Discharge   [facility] Debhora Titus is a 33 y.o. female.   Patient with no pertinent past medical history presents today with complaints of vaginal discharge. Reports that same has been ongoing since Sunday. Nursing note reports rectal discharge, however patient denies this. Reports she is sexually active with 1 partner. Denies abdominal pain. Does report some dysuria as well. Denies abdominal pain, nausea, vomiting, or diarrhea. Reports he last menstrual cycle was in April, however this is normal for her given she has PCOS.    The history is provided by the patient. No language interpreter was used.  Vaginal Discharge      Prior to Admission medications   Medication Sig Start Date End Date Taking? Authorizing Provider  amoxicillin  (AMOXIL ) 500 MG capsule Take 2 capsules (1,000 mg total) by mouth 3 (three) times daily for 14 days. 03/06/24 03/22/24  Fleming, Zelda W, NP  bismuth  subsalicylate (PEPTO BISMOL) 262 MG chewable tablet Chew 2 tablets (524 mg total) by mouth 4 (four) times daily -  before meals and at bedtime for 14 days. 03/04/24 03/20/24  Fleming, Zelda W, NP  buPROPion  (WELLBUTRIN  XL) 150 MG 24 hr tablet Take 1 tablet (150 mg total) by mouth daily. 02/22/24   Fleming, Zelda W, NP  clotrimazole -betamethasone  (LOTRISONE ) cream Apply 1 Application topically 2 (two) times daily. Apply to underarms 10/04/23   Fleming, Zelda W, NP  famotidine  (PEPCID ) 40 MG tablet Take 1 tablet (40 mg total) by mouth daily. nausea 10/04/23   Fleming, Zelda W, NP  gabapentin  (NEURONTIN ) 300 MG capsule Take 1 capsule (300 mg total) by mouth 3 (three) times daily. 02/22/24   Fleming, Zelda W, NP  hydrOXYzine  (ATARAX ) 10 MG tablet Take 1 tablet (10 mg total) by mouth every 8 (eight) hours as needed. For itching 10/04/23   Theotis Haze ORN, NP   letrozole  (FEMARA ) 2.5 MG tablet Take 1 tablet (2.5 mg total) by mouth daily. Take on days 3 to 7 following a spontaneous menses or progestin-induced bleed. 03/10/24   Anyanwu, Ugonna A, MD  lidocaine  (LIDODERM ) 5 % Place 1 patch onto the skin daily. Remove and discard patch within 12 hours or as directed by MD. 10/04/23   Theotis Haze ORN, NP  medroxyPROGESTERone  (PROVERA ) 10 MG tablet Take 1 tablet (10 mg total) by mouth daily. Use for ten days 03/10/24   Anyanwu, Ugonna A, MD  meloxicam  (MOBIC ) 7.5 MG tablet Take 1-2 tablets (7.5-15 mg total) by mouth daily for joint pain. 02/22/24   Fleming, Zelda W, NP  metFORMIN  (GLUCOPHAGE ) 1000 MG tablet Take 1 tablet (1,000 mg total) by mouth 2 (two) times daily with a meal. 03/10/24   Anyanwu, Gloris LABOR, MD  metroNIDAZOLE  (FLAGYL ) 500 MG tablet Take 1 tablet (500 mg total) by mouth 3 (three) times daily for 14 days. 03/06/24 03/22/24  Theotis Haze ORN, NP  omeprazole  (PRILOSEC) 40 MG capsule Take 1 capsule (40 mg total) by mouth 2 (two) times daily before a meal for 14 days. 03/06/24 03/22/24  Fleming, Zelda W, NP    Allergies: Patient has no known allergies.    Review of Systems  Genitourinary:  Positive for vaginal discharge.  All other systems reviewed and are negative.   Updated Vital Signs BP (!) 127/94 (BP Location: Left Arm)   Pulse 63   Temp 98.5 F (  36.9 C)   Resp 16   LMP 09/21/2023 (Exact Date)   SpO2 100%   Physical Exam Vitals and nursing note reviewed. Exam conducted with a chaperone present.  Constitutional:      General: She is not in acute distress.    Appearance: Normal appearance. She is normal weight. She is not ill-appearing, toxic-appearing or diaphoretic.  HENT:     Head: Normocephalic and atraumatic.  Cardiovascular:     Rate and Rhythm: Normal rate.  Pulmonary:     Effort: Pulmonary effort is normal. No respiratory distress.  Abdominal:     General: Abdomen is flat.     Palpations: Abdomen is soft.     Tenderness:  There is no abdominal tenderness.  Genitourinary:    Comments: Trace amount of cloudy discharge within the vagina, no vaginal or cervical lesions present. No CMT or adnexal tenderness Musculoskeletal:        General: Normal range of motion.     Cervical back: Normal range of motion.  Skin:    General: Skin is warm and dry.  Neurological:     General: No focal deficit present.     Mental Status: She is alert.  Psychiatric:        Mood and Affect: Mood normal.        Behavior: Behavior normal.     (all labs ordered are listed, but only abnormal results are displayed) Labs Reviewed  WET PREP, GENITAL - Abnormal; Notable for the following components:      Result Value   WBC, Wet Prep HPF POC >=10 (*)    All other components within normal limits  URINALYSIS, ROUTINE W REFLEX MICROSCOPIC - Abnormal; Notable for the following components:   Color, Urine STRAW (*)    All other components within normal limits  PREGNANCY, URINE  GC/CHLAMYDIA PROBE AMP ([place]) NOT AT [facility]    EKG: None  Radiology: No results found.   Procedures   Medications Ordered in the ED - No data to display                                  Medical Decision Making Amount and/or Complexity of Data Reviewed Labs: ordered.   This patient is a 33 y.o. female  who presents to the ED for concern of vaginal discharge.   Differential diagnoses prior to evaluation: The emergent differential diagnosis includes, but is not limited to,  STI, UTI, pregnancy . This is not an exhaustive differential.   Past Medical History / Co-morbidities / Social History:  has a past medical history of HSV (herpes simplex virus) infection.  Additional history: Chart reviewed.   Physical Exam: Physical exam performed. The pertinent findings include: Well-appearing, abdomen soft and nontender.  Chaperone present for pelvic exam, trace amount of cloudy discharge within the vagina, no vaginal or cervical lesions present. No  CMT or adnexal tenderness  Lab Tests/Imaging studies: I personally interpreted labs and the pertinent results include: Wet prep with greater than 10 WBCs, otherwise negative however did note Specimen diluted due to transport tube containing more than 1 ml of saline, interpret results with caution.  Medications: I ordered medication including rocephin, azithromycin, flagyl .  I have reviewed the patients home medicines and have made adjustments as needed.   Disposition: After consideration of the diagnostic results and the patients response to treatment, I feel that emergency department workup does not suggest an emergent condition  requiring admission or immediate intervention beyond what has been performed at this time. The plan is: Discharge with close outpatient follow-up and return precautions. Pt presents with concerns for possible STD.  Pt understands that they have GC/Chlamydia cultures pending and that they will need to inform all sexual partners if results return positive. Pt has been treated prophylactically with azithromycin and Rocephin due to pts history, pelvic exam, and wet prep with increased WBCs. Pt not concerning for PID because hemodynamically stable and no cervical motion tenderness on pelvic exam. Pt has also been treated with Flagyl  for Bacterial Vaginosis given exam findings and diluted wet prep. Pt has been advised to not drink alcohol while on this medication.  Patient to be discharged with instructions to follow up with OBGYN/PCP. Discussed importance of using protection when sexually active.  Evaluation and diagnostic testing in the emergency department does not suggest an emergent condition requiring admission or immediate intervention beyond what has been performed at this time.  Plan for discharge with close PCP follow-up.  Patient is understanding and amenable with plan, educated on red flag symptoms that would prompt immediate return.  Patient discharged in stable  condition.  Final diagnoses:  Concern about STD in female without diagnosis    ED Discharge Orders          Ordered    metroNIDAZOLE  (FLAGYL ) 500 MG tablet  2 times daily        03/16/24 0108          An After Visit Summary was printed and given to the patient.      Nora Lauraine DELENA DEVONNA 03/16/24 0111    Griselda Norris, MD 03/16/24 [phone]

## 2024-03-15 NOTE — ED Notes (Signed)
 Patient did not want to stay so she left. Taking her OTF

## 2024-03-16 ENCOUNTER — Other Ambulatory Visit: Payer: Self-pay

## 2024-03-16 LAB — URINALYSIS, ROUTINE W REFLEX MICROSCOPIC
Bilirubin Urine: NEGATIVE
Glucose, UA: NEGATIVE mg/dL
Hgb urine dipstick: NEGATIVE
Ketones, ur: NEGATIVE mg/dL
Leukocytes,Ua: NEGATIVE
Nitrite: NEGATIVE
Protein, ur: NEGATIVE mg/dL
Specific Gravity, Urine: 1.01 (ref 1.005–1.030)
pH: 5 (ref 5.0–8.0)

## 2024-03-16 LAB — PREGNANCY, URINE: Preg Test, Ur: NEGATIVE

## 2024-03-16 LAB — WET PREP, GENITAL
Clue Cells Wet Prep HPF POC: NONE SEEN
Sperm: NONE SEEN
Trich, Wet Prep: NONE SEEN
WBC, Wet Prep HPF POC: >=10 — AB (ref <10)
Yeast Wet Prep HPF POC: NONE SEEN

## 2024-03-16 LAB — GC/CHLAMYDIA PROBE AMP (~~LOC~~) NOT AT ARMC
Chlamydia: NEGATIVE
Comment: NEGATIVE
Comment: NORMAL
Neisseria Gonorrhea: NEGATIVE

## 2024-03-16 MED ORDER — METRONIDAZOLE 500 MG PO TABS
500.0000 mg | ORAL_TABLET | Freq: Two times a day (BID) | ORAL | 0 refills | Status: AC
  Filled 2024-03-16: qty 13, 7d supply, fill #0

## 2024-03-16 MED ORDER — CEFTRIAXONE SODIUM 1 G IJ SOLR
500.0000 mg | Freq: Once | INTRAMUSCULAR | Status: AC
  Administered 2024-03-16: 500 mg via INTRAMUSCULAR
  Filled 2024-03-16: qty 10

## 2024-03-16 MED ORDER — METRONIDAZOLE 500 MG PO TABS
500.0000 mg | ORAL_TABLET | Freq: Once | ORAL | Status: AC
  Administered 2024-03-16: 500 mg via ORAL
  Filled 2024-03-16: qty 1

## 2024-03-16 MED ORDER — LIDOCAINE HCL 1 % IJ SOLN
INTRAMUSCULAR | Status: AC
  Administered 2024-03-16: 2.1 mL
  Filled 2024-03-16: qty 20

## 2024-03-16 MED ORDER — AZITHROMYCIN 250 MG PO TABS
1000.0000 mg | ORAL_TABLET | Freq: Once | ORAL | Status: AC
  Administered 2024-03-16: 1000 mg via ORAL
  Filled 2024-03-16: qty 4

## 2024-03-16 NOTE — Discharge Instructions (Addendum)
 As we discussed, your workup in the ER was overall reassuring for acute findings.  Your urine sample did not show any signs of infection and you are not pregnant.  However, as we discussed your gonorrhea and chlamydia testing is pending at your discharge.  You have opted to go ahead and get treated for this.  We have given you 2 treatments here and I have given you a prescription for an additional antibiotic that you need to fill and take as prescribed in its entirety for management of your symptoms.  Please monitor your MyChart online for these test results which take a few days to come back.  If anything is positive, you will need to inform all sexual partners.  Please abstain from sexual intercourse until you have a negative test result.  In the future, I advise using protection when sexually active to prevent contracting STIs.  I have attached more information regarding this to your paperwork that you can review  Return if development of any new or worsening symptoms.

## 2024-03-16 NOTE — ED Notes (Signed)
 AVS provided by edp was reviewed with the pt. Pt sts having access to mychart for pending lab test. Pt confirmed pharmacy. Pt verbalized understanding of safe sex practices. Pt denies any additional questions. Pt departing with s/o at bedside.

## 2024-03-20 ENCOUNTER — Ambulatory Visit: Payer: Self-pay

## 2024-03-20 ENCOUNTER — Ambulatory Visit (INDEPENDENT_AMBULATORY_CARE_PROVIDER_SITE_OTHER): Payer: Self-pay | Admitting: Podiatry

## 2024-03-20 ENCOUNTER — Ambulatory Visit (INDEPENDENT_AMBULATORY_CARE_PROVIDER_SITE_OTHER): Payer: Self-pay | Admitting: Orthopedic Surgery

## 2024-03-20 DIAGNOSIS — M722 Plantar fascial fibromatosis: Secondary | ICD-10-CM

## 2024-03-20 DIAGNOSIS — M7731 Calcaneal spur, right foot: Secondary | ICD-10-CM

## 2024-03-20 DIAGNOSIS — M7661 Achilles tendinitis, right leg: Secondary | ICD-10-CM

## 2024-03-20 DIAGNOSIS — G5603 Carpal tunnel syndrome, bilateral upper limbs: Secondary | ICD-10-CM

## 2024-03-20 DIAGNOSIS — M7732 Calcaneal spur, left foot: Secondary | ICD-10-CM

## 2024-03-20 DIAGNOSIS — M7662 Achilles tendinitis, left leg: Secondary | ICD-10-CM

## 2024-03-20 MED ORDER — TRIAMCINOLONE ACETONIDE 10 MG/ML IJ SUSP
10.0000 mg | Freq: Once | INTRAMUSCULAR | Status: AC
  Administered 2024-03-20: 10 mg

## 2024-03-20 NOTE — Progress Notes (Signed)
 Rhonda Avila - 33 y.o. female MRN [id number]  Date of birth: Nov 08, 1990  Office Visit Note: Visit Date: 03/20/2024 PCP: Theotis Haze ORN, NP Referred by: Theotis Haze ORN, NP  Subjective: No chief complaint on file.  HPI: Rhonda Avila is a pleasant 33 y.o. female who presents today for bilateral hand pain. 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 She has had carpal tunnel in both hands for several years now. She has tried bracing and injection as well as therapy which has helped but the numbness has come back.  States that it is relatively the same on both sides.  She works as a Advertising copywriter and uses her Firef[employer] for work frequently.  Pertinent ROS were reviewed with the patient and found to be negative unless otherwise specified above in HPI.   Visit Reason: bilateral hands Duration of symptoms: years Hand dominance: right Occupation: housekeepr Diabetic: No Smoking: No Heart/Lung History: none Blood Thinners: none  Prior Testing/EMG: 09/10/21 EMG Injections (Date): 12/09/21 Treatments: injection, brace Prior Surgery: none    Assessment & Plan: Visit Diagnoses:  1. Bilateral carpal tunnel syndrome     Plan: Extensive discussion was had with the patient today regarding her ongoing bilateral carpal tunnel syndrome.  We discussed etiology and pathophysiology of carpal tunnel syndrome.  She has both clinical and electrodiagnostic evidence to support this diagnosis.  She has trialed extensive nonoperative treatments with bracing, injections and previous therapy with symptoms refractory to conservative care.  At this juncture, she would be an appropriate candidate for bilateral, staged carpal tunnel releases.  She would like to get this surgery performed in the January/February timeframe.    Risks and benefits of the procedure were discussed, risks including but not limited to infection, bleeding, scarring, stiffness, nerve injury,  tendon injury, vascular injury, recurrence of symptoms and need for subsequent operation.  We also discussed the appropriate postoperative protocol and timeframe for return to activities and function.  Patient expressed understanding.    For the meantime, she would like to move forward with cortisone injections to the bilateral carpal tunnel regions.  However, she did undergo previous injections today to the bilateral feet, will return to me in the future for further discussion regarding potential bilateral carpal tunnel injections.   Follow-up: No follow-ups on file.   Meds & Orders: No orders of the defined types were placed in this encounter.  No orders of the defined types were placed in this encounter.    Procedures: No procedures performed      Clinical History: No specialty comments available.  She reports that she has never smoked. She has never been exposed to tobacco smoke. She has never used smokeless tobacco. No results for input(s): HGBA1C, LABURIC in the last 8760 hours.  Objective:   Vital Signs: LMP 09/21/2023 (Exact Date)   Physical Exam  Gen: Well-appearing, in no acute distress; non-toxic CV: Regular Rate. Well-perfused. Warm.  Resp: Breathing unlabored on room air; no wheezing. Psych: Fluid speech in conversation; appropriate affect; normal thought process  Ortho Exam PHYSICAL EXAM:  General: Patient is well appearing and in no distress.   Skin and Muscle: No significant skin changes are apparent to upper extremities.   Range of Motion and Palpation Tests: Mobility is full about the elbows with flexion and extension Forearm supination and pronation are 85/85 bilaterally.  Wrist flexion/extension is 75/65 bilaterally.  Digital flexion and extension are full.  Thumb opposition is full to the base of  the small fingers bilaterally.    No cords or nodules are palpated.  No triggering is observed.     Neurologic, Vascular, Motor: Sensation is diminished  to light touch in the bilateral median nerve distribution.    Thenar atrophy: Negative bilateral Tinel sign: Positive bilateral carpal tunnel Carpal tunnel compression: Positive bilateral Phalen test: Positive bilateral  Motor bilateral hand FPL: 5/5 Index FDP: 5/5 APB: 5/5  Fingers pink and well perfused.  Capillary refill is brisk.     Lab Results  Component Value Date   HGBA1C 5.3 11/14/2020     Imaging: No results found.  Past Medical/Family/Surgical/Social History: Medications & Allergies reviewed per EMR, new medications updated. Patient Active Problem List   Diagnosis Date Noted   Skin lesion 04/10/2022   Lichen sclerosus 12/10/2021   Screening for thyroid  disorder 12/10/2021   Numbness and tingling in left hand 12/09/2021   Numbness and tingling in right hand 08/26/2021   Carpal tunnel syndrome, right upper limb 02/20/2021   Hypertension 07/20/2018   UTI (urinary tract infection) during pregnancy, first trimester 01/10/2018   PCOS (polycystic ovarian syndrome) 01/03/2018   Past Medical History:  Diagnosis Date   HSV (herpes simplex virus) infection    Family History  Problem Relation Age of Onset   Hypertension Mother    Diabetes Mother    Arthritis Mother    Hypertension Maternal Grandmother    Hypertension Sister    Past Surgical History:  Procedure Laterality Date   NO PAST SURGERIES     Social History   Occupational History   Not on file  Tobacco Use   Smoking status: Never    Passive exposure: Never   Smokeless tobacco: Never  Vaping Use   Vaping status: Never Used  Substance and Sexual Activity   Alcohol use: Yes   Drug use: Never   Sexual activity: Yes    Birth control/protection: None    Comment: same partner    Adlene Adduci Estela) Arlinda, M.D. [facility], Hand Surgery

## 2024-03-20 NOTE — Patient Instructions (Addendum)
 And the exercises do not forget that I while at your visit today you received a steroid injection in your foot or ankle to help with your pain. Along with having the steroid medication there is some numbing medication in the shot that you received. Due to this you may notice some numbness to the area for the next couple of hours.   I would recommend limiting activity for the next few days to help the steroid injection take affect.    The actually benefit from the steroid injection may take up to 2-7 days to see a difference. You may actually experience a small (as in 10%) INCREASE in pain in the first 24 hours---that is common. It would be best if you can ice the area today and take anti-inflammatory medications (such as Ibuprofen , Motrin , or Aleve) if you are able to take these medications. If you were prescribed another medication to help with the pain go ahead and start that medication today    Things to watch out for that you should contact us  or a health care provider urgently would include: 1. Unusual (as in more than 10%) increase in pain 2. New fever > 101.5 3. New swelling or redness of the injected area.  4. Streaking of red lines around the area injected.  If you have any questions or concerns about this, please give our office a call at [phone].     Plantar Fasciitis (Heel Spur Syndrome) with Rehab The plantar fascia is a fibrous, ligament-like, soft-tissue structure that spans the bottom of the foot. Plantar fasciitis is a condition that causes pain in the foot due to inflammation of the tissue. SYMPTOMS  Pain and tenderness on the underneath side of the foot. Pain that worsens with standing or walking. CAUSES  Plantar fasciitis is caused by irritation and injury to the plantar fascia on the underneath side of the foot. Common mechanisms of injury include: Direct trauma to bottom of the foot. Damage to a small nerve that runs under the foot where the main fascia attaches  to the heel bone. Stress placed on the plantar fascia due to bone spurs. RISK INCREASES WITH:  Activities that place stress on the plantar fascia (running, jumping, pivoting, or cutting). Poor strength and flexibility. Improperly fitted shoes. Tight calf muscles. Flat feet. Failure to warm-up properly before activity. Obesity. PREVENTION Warm up and stretch properly before activity. Allow for adequate recovery between workouts. Maintain physical fitness: Strength, flexibility, and endurance. Cardiovascular fitness. Maintain a health body weight. Avoid stress on the plantar fascia. Wear properly fitted shoes, including arch supports for individuals who have flat feet.  PROGNOSIS  If treated properly, then the symptoms of plantar fasciitis usually resolve without surgery. However, occasionally surgery is necessary.  RELATED COMPLICATIONS  Recurrent symptoms that may result in a chronic condition. Problems of the lower back that are caused by compensating for the injury, such as limping. Pain or weakness of the foot during push-off following surgery. Chronic inflammation, scarring, and partial or complete fascia tear, occurring more often from repeated injections.  TREATMENT  Treatment initially involves the use of ice and medication to help reduce pain and inflammation. The use of strengthening and stretching exercises may help reduce pain with activity, especially stretches of the Achilles tendon. These exercises may be performed at home or with a therapist. Your caregiver may recommend that you use heel cups of arch supports to help reduce stress on the plantar fascia. Occasionally, corticosteroid injections are given to reduce inflammation.  If symptoms persist for greater than 6 months despite non-surgical (conservative), then surgery may be recommended.   MEDICATION  If pain medication is necessary, then nonsteroidal anti-inflammatory medications, such as aspirin and ibuprofen , or  other minor pain relievers, such as acetaminophen , are often recommended. Do not take pain medication within 7 days before surgery. Prescription pain relievers may be given if deemed necessary by your caregiver. Use only as directed and only as much as you need. Corticosteroid injections may be given by your caregiver. These injections should be reserved for the most serious cases, because they may only be given a certain number of times.  HEAT AND COLD Cold treatment (icing) relieves pain and reduces inflammation. Cold treatment should be applied for 10 to 15 minutes every 2 to 3 hours for inflammation and pain and immediately after any activity that aggravates your symptoms. Use ice packs or massage the area with a piece of ice (ice massage). Heat treatment may be used prior to performing the stretching and strengthening activities prescribed by your caregiver, physical therapist, or athletic trainer. Use a heat pack or soak the injury in warm water.  SEEK IMMEDIATE MEDICAL CARE IF: Treatment seems to offer no benefit, or the condition worsens. Any medications produce adverse side effects.  EXERCISES- RANGE OF MOTION (ROM) AND STRETCHING EXERCISES - Plantar Fasciitis (Heel Spur Syndrome) These exercises may help you when beginning to rehabilitate your injury. Your symptoms may resolve with or without further involvement from your physician, physical therapist or athletic trainer. While completing these exercises, remember:  Restoring tissue flexibility helps normal motion to return to the joints. This allows healthier, less painful movement and activity. An effective stretch should be held for at least 30 seconds. A stretch should never be painful. You should only feel a gentle lengthening or release in the stretched tissue.  RANGE OF MOTION - Toe Extension, Flexion Sit with your right / left leg crossed over your opposite knee. Grasp your toes and gently pull them back toward the top of your  foot. You should feel a stretch on the bottom of your toes and/or foot. Hold this stretch for 10 seconds. Now, gently pull your toes toward the bottom of your foot. You should feel a stretch on the top of your toes and or foot. Hold this stretch for 10 seconds. Repeat  times. Complete this stretch 3 times per day.   RANGE OF MOTION - Ankle Dorsiflexion, Active Assisted Remove shoes and sit on a chair that is preferably not on a carpeted surface. Place right / left foot under knee. Extend your opposite leg for support. Keeping your heel down, slide your right / left foot back toward the chair until you feel a stretch at your ankle or calf. If you do not feel a stretch, slide your bottom forward to the edge of the chair, while still keeping your heel down. Hold this stretch for 10 seconds. Repeat 3 times. Complete this stretch 2 times per day.   STRETCH  Gastroc, Standing Place hands on wall. Extend right / left leg, keeping the front knee somewhat bent. Slightly point your toes inward on your back foot. Keeping your right / left heel on the floor and your knee straight, shift your weight toward the wall, not allowing your back to arch. You should feel a gentle stretch in the right / left calf. Hold this position for 10 seconds. Repeat 3 times. Complete this stretch 2 times per day.  STRETCH  Soleus, Standing Place  hands on wall. Extend right / left leg, keeping the other knee somewhat bent. Slightly point your toes inward on your back foot. Keep your right / left heel on the floor, bend your back knee, and slightly shift your weight over the back leg so that you feel a gentle stretch deep in your back calf. Hold this position for 10 seconds. Repeat 3 times. Complete this stretch 2 times per day.  STRETCH  Gastrocsoleus, Standing  Note: This exercise can place a lot of stress on your foot and ankle. Please complete this exercise only if specifically instructed by your caregiver.  Place  the ball of your right / left foot on a step, keeping your other foot firmly on the same step. Hold on to the wall or a rail for balance. Slowly lift your other foot, allowing your body weight to press your heel down over the edge of the step. You should feel a stretch in your right / left calf. Hold this position for 10 seconds. Repeat this exercise with a slight bend in your right / left knee. Repeat 3 times. Complete this stretch 2 times per day.   STRENGTHENING EXERCISES - Plantar Fasciitis (Heel Spur Syndrome)  These exercises may help you when beginning to rehabilitate your injury. They may resolve your symptoms with or without further involvement from your physician, physical therapist or athletic trainer. While completing these exercises, remember:  Muscles can gain both the endurance and the strength needed for everyday activities through controlled exercises. Complete these exercises as instructed by your physician, physical therapist or athletic trainer. Progress the resistance and repetitions only as guided.  STRENGTH - Towel Curls Sit in a chair positioned on a non-carpeted surface. Place your foot on a towel, keeping your heel on the floor. Pull the towel toward your heel by only curling your toes. Keep your heel on the floor. Repeat 3 times. Complete this exercise 2 times per day.  STRENGTH - Ankle Inversion Secure one end of a rubber exercise band/tubing to a fixed object (table, pole). Loop the other end around your foot just before your toes. Place your fists between your knees. This will focus your strengthening at your ankle. Slowly, pull your big toe up and in, making sure the band/tubing is positioned to resist the entire motion. Hold this position for 10 seconds. Have your muscles resist the band/tubing as it slowly pulls your foot back to the starting position. Repeat 3 times. Complete this exercises 2 times per day.  Document Released: 06/01/2005 Document Revised:  08/24/2011 Document Reviewed: 09/13/2008 [facility] Patient Information 2014 [place], [place].

## 2024-03-20 NOTE — Progress Notes (Signed)
 Patient presents today with complaint of pain at the heels bilaterally.  Worse first thing in the morning and after standing.  No injury.  Has been bothering for several months now.  Has not noticed any redness or ecchymosis.  At some point tenderness in the posterior aspect of the heel also.  Physical exam:  General appearance: Pleasant, and in no acute distress. AOx3.  Vascular: Pedal pulses: DP 2/4 bilaterally, PT 2/4 bilaterally. Mild edema lower legs bilaterally. Capillary fill time immediate b/l.  Neurological: Light touch intact feet bilaterally.  Normal Achilles reflex bilaterally.  No clonus or spasticity noted.  Negative Tinel sign tarsal tunnel and porta pedis bilaterally  Dermatologic:   Skin normal temperature bilaterally.  Skin normal color, tone, and texture bilaterally.   Musculoskeletal: Tenderness plantar medial aspect heel at the medial plantar calcaneal tubercle bilaterally.  Some tenderness in the posterior aspect of the heel at the insertion Achilles tendon.  No tenderness with lateral compression of the calcaneus.  Normal muscle strength lower extremity bilaterally  Radiographs: 3 views foot bilaterally: Osteophytic changes plantar posterior aspect calcaneus.  No erosive changes or evidence of stress fractures.  Normal bone density.  Notes of bone tumors.  Diagnosis: 1.  Achilles tendinitis bilaterally  2. Plantar fasciitis b/l 3. Calcaneal spur b/l  Plan: -New patient office visit for evaluation and management level 3.  Modifier 25. - Discussed with her the plantar fasciitis and the resulting Achilles tendinitis.  Discussed etiology and treatment.  Discussed proper shoes. -Gave written the at home therapy she can do. -injected 3cc 2:1 mixture 0.5 cc Marcaine :Kenolog 10mg /85ml at medial plantar fascia origin at medial plantar calcaneal tubercle bilaterally.     Return 2 weeks follow-up injection plantar fascia bilateral

## 2024-03-21 ENCOUNTER — Ambulatory Visit: Payer: Self-pay | Admitting: Podiatry

## 2024-03-28 ENCOUNTER — Encounter: Payer: Self-pay | Admitting: Orthopedic Surgery

## 2024-03-28 LAB — REPROSURE
ANTI-MULLERIAN HORMONE (AMH): 5.06 ng/mL
Estradiol, Serum, MS: 86 pg/mL
Follicle Stimulating Hormone: 5.1 m[IU]/mL

## 2024-03-28 LAB — HEMOGLOBIN A1C
Est. average glucose Bld gHb Est-mCnc: 103 mg/dL
Hgb A1c MFr Bld: 5.2 % (ref 4.8–5.6)

## 2024-03-28 LAB — COMPREHENSIVE METABOLIC PANEL WITH GFR
ALT: 10 IU/L (ref 0–32)
AST: 11 IU/L (ref 0–40)
Albumin: 4.2 g/dL (ref 3.9–4.9)
Alkaline Phosphatase: 83 IU/L (ref 41–116)
BUN/Creatinine Ratio: 15 (ref 9–23)
BUN: 11 mg/dL (ref 6–20)
Bilirubin Total: 0.4 mg/dL (ref 0.0–1.2)
CO2: 22 mmol/L (ref 20–29)
Calcium: 9.4 mg/dL (ref 8.7–10.2)
Chloride: 103 mmol/L (ref 96–106)
Creatinine, Ser: 0.72 mg/dL (ref 0.57–1.00)
Globulin, Total: 2.8 g/dL (ref 1.5–4.5)
Glucose: 78 mg/dL (ref 70–99)
Potassium: 4.4 mmol/L (ref 3.5–5.2)
Sodium: 139 mmol/L (ref 134–144)
Total Protein: 7 g/dL (ref 6.0–8.5)
eGFR: 114 mL/min/1.73 (ref >59)

## 2024-03-28 LAB — REPROSURE PATIENT EDUCATION

## 2024-03-28 LAB — BETA HCG QUANT (REF LAB): hCG Quant: <1 m[IU]/mL

## 2024-03-28 LAB — TESTOSTERONE,FREE AND TOTAL
Testosterone, Free: 2.3 pg/mL (ref 0.0–4.2)
Testosterone: 30 ng/dL (ref 8–60)

## 2024-03-28 LAB — PROLACTIN: Prolactin: 14.3 ng/mL (ref 4.8–33.4)

## 2024-03-29 ENCOUNTER — Ambulatory Visit: Payer: Self-pay | Admitting: Obstetrics & Gynecology

## 2024-04-04 ENCOUNTER — Ambulatory Visit (INDEPENDENT_AMBULATORY_CARE_PROVIDER_SITE_OTHER): Payer: Self-pay | Admitting: Podiatry

## 2024-04-04 DIAGNOSIS — M722 Plantar fascial fibromatosis: Secondary | ICD-10-CM

## 2024-04-04 MED ORDER — TRIAMCINOLONE ACETONIDE 10 MG/ML IJ SUSP
10.0000 mg | Freq: Once | INTRAMUSCULAR | Status: AC
  Administered 2024-04-04: 10 mg

## 2024-04-04 NOTE — Progress Notes (Signed)
 Follow-up plantar fasciitis bilaterally.  Left is doing well with no discomfort whatsoever still some pain on the right but probably about 60 to 70% better.  Pain mostly at the plantar medial aspect of the heel on the right and plantar aspect.   Physical exam:  General appearance: Pleasant, and in no acute distress. AOx3.  Vascular: Pedal pulses: DP 2/4 bilaterally, PT 2/4 bilaterally. Mild edema lower legs bilaterally. Capillary fill time immediate b/l.  Neurological: Negative Tinel's sign tarsal tunnel and porta pedis bilaterally  Dermatologic:   Skin normal temperature bilaterally.  Skin normal color, tone, and texture bilaterally.   Musculoskeletal: Plantar medial aspect heel right at the medial plantar calcaneal tubercle.  No tenderness lateral compression of calcaneus.   Diagnosis: 1.  Plantar fasciitis right.  Plan: -Will try second injection on the right.  The left is resolved.  Continue wearing good supportive shoes.  Continue to ice and exercise. -injected 3cc 2:1 mixture 0.5 cc Marcaine :Kenolog 10mg /61ml at plantar fascia origin medial plantar calcaneal tubercle right.     Return 2 weeks follow-up injection plantar fascia right

## 2024-04-06 ENCOUNTER — Ambulatory Visit: Payer: Self-pay | Admitting: Orthopedic Surgery

## 2024-04-17 ENCOUNTER — Encounter: Payer: Self-pay | Admitting: Radiology

## 2024-04-18 ENCOUNTER — Ambulatory Visit: Payer: Self-pay | Admitting: Podiatry

## 2024-04-21 ENCOUNTER — Other Ambulatory Visit: Payer: Self-pay | Admitting: Nurse Practitioner

## 2024-04-21 ENCOUNTER — Other Ambulatory Visit: Payer: Self-pay

## 2024-04-24 ENCOUNTER — Other Ambulatory Visit: Payer: Self-pay

## 2024-05-01 ENCOUNTER — Other Ambulatory Visit: Payer: Self-pay

## 2024-05-26 ENCOUNTER — Encounter: Payer: Self-pay | Admitting: Podiatry

## 2024-05-26 ENCOUNTER — Ambulatory Visit (INDEPENDENT_AMBULATORY_CARE_PROVIDER_SITE_OTHER): Payer: Self-pay | Admitting: Podiatry

## 2024-05-26 DIAGNOSIS — M722 Plantar fascial fibromatosis: Secondary | ICD-10-CM

## 2024-05-26 MED ORDER — TRIAMCINOLONE ACETONIDE 40 MG/ML IJ SUSP
40.0000 mg | Freq: Once | INTRAMUSCULAR | Status: AC
  Administered 2024-05-26: 40 mg

## 2024-05-26 NOTE — Progress Notes (Signed)
 She complains today with pain heels bilaterally.  We injected her twice on the right heel and once in the left several months ago.  She been doing fine until the past several weeks.  Has been doing stretching exercises and taking Tylenol .   Physical exam:  General appearance: Pleasant, and in no acute distress. AOx3.  Vascular: Pedal pulses: DP 2/4 bilaterally, PT 2/4 bilaterally. Minimal edema lower legs bilaterally. Capillary fill time immediate b/l.Rhonda Avila  Neurological: Tinel sign tarsal tunnel and porta pedis bilaterally  Dermatologic:   Skin normal temperature bilaterally.  Skin normal color, tone, and texture bilaterally.   Musculoskeletal: There is plantar medial aspect heel at the medial plantar calcaneal tubercle bilaterally.  No tenderness lateral compression of the calcaneus.    Diagnosis: 1.  Plantar fasciitis bilaterally  Plan: -Continue doing stretching exercises.  Also recommended OTC naproxen taking 2 twice a day for anti-inflammatory effect. -injected 3cc 2:1 mixture 0.5 cc Marcaine : Triamcinolone  40mg /61ml at plantar fascia origin at medial plantar calcaneal tubercle bilaterally.     Return to follow-up injection plantar fascia bilaterally

## 2024-06-12 ENCOUNTER — Ambulatory Visit: Payer: Self-pay | Admitting: Orthopedic Surgery

## 2024-06-13 ENCOUNTER — Other Ambulatory Visit: Payer: Self-pay

## 2024-06-13 ENCOUNTER — Other Ambulatory Visit: Payer: Self-pay | Admitting: Obstetrics & Gynecology

## 2024-06-13 DIAGNOSIS — N970 Female infertility associated with anovulation: Secondary | ICD-10-CM

## 2024-06-19 ENCOUNTER — Other Ambulatory Visit: Payer: Self-pay

## 2024-06-20 ENCOUNTER — Ambulatory Visit: Payer: Self-pay | Admitting: Podiatry

## 2024-06-20 ENCOUNTER — Other Ambulatory Visit: Payer: Self-pay

## 2024-06-20 MED ORDER — LETROZOLE 2.5 MG PO TABS
5.0000 mg | ORAL_TABLET | Freq: Every day | ORAL | 1 refills | Status: AC
  Filled 2024-06-20: qty 10, 5d supply, fill #0

## 2024-06-29 ENCOUNTER — Other Ambulatory Visit: Payer: Self-pay

## 2024-07-13 ENCOUNTER — Ambulatory Visit: Payer: Self-pay | Admitting: Podiatry

## 2024-08-17 ENCOUNTER — Ambulatory Visit: Payer: Self-pay
